# Patient Record
Sex: Female | Born: 1990 | Race: White | Hispanic: No | Marital: Single | Smoking: Former smoker
Health system: Southern US, Community
[De-identification: ages and names within clinical notes are randomized; demographics above are authoritative.]

## PROBLEM LIST (undated history)

## (undated) ENCOUNTER — Inpatient Hospital Stay: Payer: Self-pay

## (undated) DIAGNOSIS — L304 Erythema intertrigo: Secondary | ICD-10-CM

## (undated) DIAGNOSIS — F429 Obsessive-compulsive disorder, unspecified: Secondary | ICD-10-CM

## (undated) DIAGNOSIS — F410 Panic disorder [episodic paroxysmal anxiety] without agoraphobia: Secondary | ICD-10-CM

## (undated) DIAGNOSIS — K219 Gastro-esophageal reflux disease without esophagitis: Secondary | ICD-10-CM

## (undated) DIAGNOSIS — E669 Obesity, unspecified: Secondary | ICD-10-CM

## (undated) DIAGNOSIS — L309 Dermatitis, unspecified: Secondary | ICD-10-CM

## (undated) DIAGNOSIS — F32A Depression, unspecified: Secondary | ICD-10-CM

## (undated) DIAGNOSIS — F329 Major depressive disorder, single episode, unspecified: Secondary | ICD-10-CM

## (undated) DIAGNOSIS — T7840XA Allergy, unspecified, initial encounter: Secondary | ICD-10-CM

## (undated) HISTORY — DX: Erythema intertrigo: L30.4

## (undated) HISTORY — DX: Panic disorder (episodic paroxysmal anxiety): F41.0

## (undated) HISTORY — DX: Gastro-esophageal reflux disease without esophagitis: K21.9

## (undated) HISTORY — DX: Obsessive-compulsive disorder, unspecified: F42.9

## (undated) HISTORY — DX: Obesity, unspecified: E66.9

## (undated) HISTORY — DX: Dermatitis, unspecified: L30.9

## (undated) HISTORY — DX: Allergy, unspecified, initial encounter: T78.40XA

## (undated) HISTORY — DX: Depression, unspecified: F32.A

## (undated) HISTORY — DX: Major depressive disorder, single episode, unspecified: F32.9

---
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
## 2010-02-13 ENCOUNTER — Observation Stay: Payer: Self-pay

## 2010-02-16 ENCOUNTER — Observation Stay: Payer: Self-pay

## 2010-02-18 ENCOUNTER — Inpatient Hospital Stay: Payer: Self-pay | Admitting: Obstetrics and Gynecology

## 2010-02-20 DIAGNOSIS — O4100X0 Oligohydramnios, unspecified trimester, not applicable or unspecified: Secondary | ICD-10-CM

## 2010-05-02 ENCOUNTER — Ambulatory Visit: Payer: Self-pay | Admitting: Family Medicine

## 2010-05-02 DIAGNOSIS — J309 Allergic rhinitis, unspecified: Secondary | ICD-10-CM | POA: Insufficient documentation

## 2010-05-06 ENCOUNTER — Emergency Department: Payer: Self-pay | Admitting: Unknown Physician Specialty

## 2010-12-27 ENCOUNTER — Ambulatory Visit: Payer: Self-pay | Admitting: Family Medicine

## 2011-01-09 HISTORY — PX: CHOLECYSTECTOMY: SHX55

## 2011-01-15 ENCOUNTER — Ambulatory Visit: Payer: Self-pay | Admitting: Anesthesiology

## 2011-01-20 ENCOUNTER — Ambulatory Visit: Payer: Self-pay | Admitting: Surgery

## 2011-01-21 LAB — PATHOLOGY REPORT

## 2011-06-16 ENCOUNTER — Ambulatory Visit: Payer: Self-pay | Admitting: Orthopedic Surgery

## 2012-11-25 ENCOUNTER — Emergency Department: Payer: Self-pay | Admitting: Internal Medicine

## 2013-08-21 ENCOUNTER — Emergency Department: Payer: Self-pay | Admitting: Emergency Medicine

## 2013-10-18 LAB — HM PAP SMEAR: HM Pap smear: NORMAL

## 2014-03-06 ENCOUNTER — Emergency Department: Payer: Self-pay | Admitting: Emergency Medicine

## 2014-03-06 LAB — URINALYSIS, COMPLETE
BACTERIA: NONE SEEN
Bilirubin,UR: NEGATIVE
Blood: NEGATIVE
GLUCOSE, UR: NEGATIVE mg/dL (ref 0–75)
Ketone: NEGATIVE
LEUKOCYTE ESTERASE: NEGATIVE
Nitrite: NEGATIVE
PH: 7 (ref 4.5–8.0)
Protein: NEGATIVE
RBC,UR: 1 /HPF (ref 0–5)
Specific Gravity: 1.021 (ref 1.003–1.030)
Squamous Epithelial: 2
WBC UR: 2 /HPF (ref 0–5)

## 2014-03-06 LAB — COMPREHENSIVE METABOLIC PANEL
ALK PHOS: 102 U/L
ALT: 30 U/L (ref 12–78)
ANION GAP: 7 (ref 7–16)
Albumin: 3.7 g/dL (ref 3.4–5.0)
BILIRUBIN TOTAL: 0.5 mg/dL (ref 0.2–1.0)
BUN: 8 mg/dL (ref 7–18)
CALCIUM: 8.9 mg/dL (ref 8.5–10.1)
Chloride: 102 mmol/L (ref 98–107)
Co2: 25 mmol/L (ref 21–32)
Creatinine: 0.73 mg/dL (ref 0.60–1.30)
EGFR (African American): >60
EGFR (Non-African Amer.): >60
Glucose: 91 mg/dL (ref 65–99)
Osmolality: 266 (ref 275–301)
Potassium: 3.5 mmol/L (ref 3.5–5.1)
SGOT(AST): 23 U/L (ref 15–37)
SODIUM: 134 mmol/L — AB (ref 136–145)
TOTAL PROTEIN: 8.2 g/dL (ref 6.4–8.2)

## 2014-03-06 LAB — CBC WITH DIFFERENTIAL/PLATELET
Basophil #: 0.1 10*3/uL (ref 0.0–0.1)
Basophil %: 0.5 %
Eosinophil #: 0.1 10*3/uL (ref 0.0–0.7)
Eosinophil %: 1.1 %
HCT: 38 % (ref 35.0–47.0)
HGB: 12.6 g/dL (ref 12.0–16.0)
Lymphocyte #: 2.4 10*3/uL (ref 1.0–3.6)
Lymphocyte %: 19.6 %
MCH: 26.4 pg (ref 26.0–34.0)
MCHC: 33.2 g/dL (ref 32.0–36.0)
MCV: 80 fL (ref 80–100)
MONO ABS: 0.7 x10 3/mm (ref 0.2–0.9)
Monocyte %: 5.8 %
Neutrophil #: 9.1 10*3/uL — ABNORMAL HIGH (ref 1.4–6.5)
Neutrophil %: 73 %
PLATELETS: 201 10*3/uL (ref 150–440)
RBC: 4.78 10*6/uL (ref 3.80–5.20)
RDW: 15.9 % — ABNORMAL HIGH (ref 11.5–14.5)
WBC: 12.5 10*3/uL — ABNORMAL HIGH (ref 3.6–11.0)

## 2014-03-06 LAB — GC/CHLAMYDIA PROBE AMP

## 2014-03-06 LAB — HCG, QUANTITATIVE, PREGNANCY: Beta Hcg, Quant.: 163 m[IU]/mL — ABNORMAL HIGH

## 2014-03-06 LAB — WET PREP, GENITAL

## 2014-03-14 ENCOUNTER — Emergency Department: Payer: Self-pay | Admitting: Emergency Medicine

## 2014-03-14 LAB — CBC
HCT: 38.3 % (ref 35.0–47.0)
HGB: 12.9 g/dL (ref 12.0–16.0)
MCH: 26.4 pg (ref 26.0–34.0)
MCHC: 33.7 g/dL (ref 32.0–36.0)
MCV: 78 fL — ABNORMAL LOW (ref 80–100)
Platelet: 189 10*3/uL (ref 150–440)
RBC: 4.89 10*6/uL (ref 3.80–5.20)
RDW: 15.5 % — ABNORMAL HIGH (ref 11.5–14.5)
WBC: 5.7 10*3/uL (ref 3.6–11.0)

## 2014-03-14 LAB — HCG, QUANTITATIVE, PREGNANCY: BETA HCG, QUANT.: 2029 m[IU]/mL — AB

## 2014-03-17 ENCOUNTER — Emergency Department: Payer: Self-pay | Admitting: Emergency Medicine

## 2014-03-17 LAB — HCG, QUANTITATIVE, PREGNANCY: Beta Hcg, Quant.: 4324 m[IU]/mL — ABNORMAL HIGH

## 2014-03-17 LAB — CBC
HCT: 39.3 % (ref 35.0–47.0)
HGB: 12.7 g/dL (ref 12.0–16.0)
MCH: 25.5 pg — ABNORMAL LOW (ref 26.0–34.0)
MCHC: 32.3 g/dL (ref 32.0–36.0)
MCV: 79 fL — ABNORMAL LOW (ref 80–100)
PLATELETS: 215 10*3/uL (ref 150–440)
RBC: 4.97 10*6/uL (ref 3.80–5.20)
RDW: 15.4 % — AB (ref 11.5–14.5)
WBC: 11.5 10*3/uL — AB (ref 3.6–11.0)

## 2014-03-18 LAB — URINALYSIS, COMPLETE
Bilirubin,UR: NEGATIVE
Glucose,UR: NEGATIVE mg/dL (ref 0–75)
Nitrite: NEGATIVE
Ph: 5 (ref 4.5–8.0)
Protein: 30
RBC,UR: 21 /HPF (ref 0–5)
Specific Gravity: 1.03 (ref 1.003–1.030)
Squamous Epithelial: 17
WBC UR: 24 /HPF (ref 0–5)

## 2014-03-23 ENCOUNTER — Encounter: Payer: Self-pay | Admitting: Obstetrics and Gynecology

## 2014-03-25 ENCOUNTER — Emergency Department: Payer: Self-pay | Admitting: Emergency Medicine

## 2014-03-25 LAB — HCG, QUANTITATIVE, PREGNANCY: Beta Hcg, Quant.: 15371 m[IU]/mL — ABNORMAL HIGH

## 2014-05-04 ENCOUNTER — Encounter: Payer: Self-pay | Admitting: Maternal & Fetal Medicine

## 2014-05-17 ENCOUNTER — Emergency Department: Payer: Self-pay | Admitting: Emergency Medicine

## 2014-05-17 LAB — HCG, QUANTITATIVE, PREGNANCY: Beta Hcg, Quant.: 30350 m[IU]/mL — ABNORMAL HIGH

## 2014-06-15 ENCOUNTER — Ambulatory Visit: Payer: Self-pay | Admitting: Physician Assistant

## 2014-08-04 ENCOUNTER — Observation Stay: Payer: Self-pay | Admitting: Obstetrics and Gynecology

## 2014-08-08 ENCOUNTER — Encounter: Payer: Self-pay | Admitting: Pediatric Cardiology

## 2014-08-08 DIAGNOSIS — Z8279 Family history of other congenital malformations, deformations and chromosomal abnormalities: Secondary | ICD-10-CM | POA: Insufficient documentation

## 2014-10-30 ENCOUNTER — Observation Stay: Payer: Self-pay | Admitting: Obstetrics and Gynecology

## 2014-11-03 ENCOUNTER — Observation Stay: Payer: Self-pay | Admitting: Obstetrics and Gynecology

## 2014-11-08 ENCOUNTER — Observation Stay: Payer: Self-pay | Admitting: Obstetrics and Gynecology

## 2014-11-14 ENCOUNTER — Observation Stay: Payer: Self-pay | Admitting: Obstetrics and Gynecology

## 2014-11-18 LAB — CBC WITH DIFFERENTIAL/PLATELET
Basophil #: 0 10*3/uL (ref 0.0–0.1)
Basophil %: 0.3 %
Eosinophil #: 0.1 10*3/uL (ref 0.0–0.7)
Eosinophil %: 0.6 %
HCT: 34.1 % — ABNORMAL LOW (ref 35.0–47.0)
HGB: 11.2 g/dL — ABNORMAL LOW (ref 12.0–16.0)
Lymphocyte #: 3 10*3/uL (ref 1.0–3.6)
Lymphocyte %: 24.4 %
MCH: 25.1 pg — ABNORMAL LOW (ref 26.0–34.0)
MCHC: 32.8 g/dL (ref 32.0–36.0)
MCV: 77 fL — AB (ref 80–100)
MONO ABS: 0.6 x10 3/mm (ref 0.2–0.9)
Monocyte %: 5 %
NEUTROS PCT: 69.7 %
Neutrophil #: 8.5 10*3/uL — ABNORMAL HIGH (ref 1.4–6.5)
PLATELETS: 191 10*3/uL (ref 150–440)
RBC: 4.45 10*6/uL (ref 3.80–5.20)
RDW: 15 % — AB (ref 11.5–14.5)
WBC: 12.2 10*3/uL — AB (ref 3.6–11.0)

## 2014-11-18 LAB — GC/CHLAMYDIA PROBE AMP

## 2014-11-19 ENCOUNTER — Inpatient Hospital Stay: Payer: Self-pay

## 2014-11-20 LAB — HEMATOCRIT: HCT: 27.1 % — ABNORMAL LOW (ref 35.0–47.0)

## 2015-03-20 NOTE — H&P (Signed)
L&D Evaluation:  History:  HPI 24 yo G2P1001 at 5619w4d [facility] presenting for evaluation of vaginal bleeding.  Bleeding is described as vaginal spotting noted when whipping.  She has had two episodes.  Has not had to wear a pad.  No abdominal pain.  No concern for low lying placenta or previa on 20 week scan.  Patient denies recent cervical checks or intercourse.  Called clinic but states not work in appointment today so presented to L&D for evaluation.   +FM, no LOF, no VB, no ctx  [facility] with normal 1-hr OGTT.  1st pregnancy complicated by son being born with aortic stenosis not diagnosed prenatally   Presents with vaginal bleeding   Patient's Medical History PCOS, HSV   Patient's Surgical History Colonoscopy   Medications Pre Natal Vitamins   Allergies NKDA   Social History none   Family History Non-Contributory   ROS:  ROS All systems were reviewed.  HEENT, CNS, GI, GU, Respiratory, CV, Renal and Musculoskeletal systems were found to be normal.   Exam:  Vital Signs stable   Urine Protein not completed   General no apparent distress   Mental Status clear   Abdomen gravid, non-tender   Estimated Fetal Weight Average for gestational age   Edema no edema   Pelvic no external lesions, cervix closed and thick, no bleeding noted from cervix, no blood in vaginal vault or on examining glove following bimanual exam   Mebranes Intact   FHT 140, moderate, no accels, no decels approrpiate for gestational age   Ucx absent   Impression:  Impression Late second trimester spotting   Plan:  Plan EFM/NST   Comments 1) Discussed implications for bleeding in second third trimester, most light bleeding likley due to cervicitis or ectroprion.  No concern for abruption or previa.   PTL may present with vaginal bleeding as well but we saw no evidence of conractions and closed cervix.  I did stress to the patient that should she  have further or heavier bleeding, abdominal pain, decreased fetal movment to represent for re-evaluation. Patient is Rh positive rhogam not indicated  2) Fetus - RFS for gestational age  573) Disposition - discharged home   Electronic Signatures: Lorrene ReidStaebler, Daisja Kessinger M (MD)  (Signed 26-Sep-15 14:32)  Authored: L&D Evaluation   Last Updated: 26-Sep-15 14:32 by Lorrene ReidStaebler, Elisea Khader M (MD)

## 2015-03-20 NOTE — H&P (Signed)
L&D Evaluation:  History:  HPI 24 yo G2P1001 at 3938w0d by EDC of 11/14/2014 by first trimester ultraosund presented with decreased FM. Denies VB, LOF, irregular ctxs.   [facility] with normal 1-hr OGTT., first and second trimester bleeding, scabies, and genital herpes. 1st pregnancy complicated by son being born with aortic stenosis not diagnosed prenatally   Presents with decreased fetal movement   Patient's Medical History PCOS, HSV   Patient's Surgical History Colonoscopy   Medications Pre Natal Vitamins  Valtrex   Allergies NKDA   Social History none   Family History Non-Contributory   ROS:  ROS see HPI   Exam:  Vital Signs 128/74   Urine Protein not completed   General no apparent distress   Mental Status clear   Abdomen gravid, non-tender   Fetal Position cephalic   FHT normal rate with no decels, 130, moderate, positive accels, no decels   FHT Description mod variability   Ucx irregular, q6-308min   Impression:  Impression IUP at 4238w0d with decreased fetal movement   Plan:  Comments - reactive NST - discharge home with [facility]   Electronic Signatures: Lorrene ReidStaebler, Jomes Giraldo M (MD)  (Signed 05-Jan-16 22:21)  Authored: L&D Evaluation   Last Updated: 05-Jan-16 22:21 by Lorrene ReidStaebler, Burgess Sheriff M (MD)

## 2015-03-20 NOTE — H&P (Signed)
L&D Evaluation:  History:  HPI 24 yo G2P1001 with EDC of 11/14/2014 by first trimester ultraosund presented at 38 weeks with decreased FM x 2 hours after falling 8-9 hours ago. She reports tripping and falling over a stump in her yard, falling on her right side. around 7AM. Denies VB, LOF, or abdominal pain. No regular ctxs. last felt baby move two hours PTA.   [facility] with normal 1-hr OGTT., first and second trimester bleeding, scabies, and genital herpes. 1st pregnancy complicated by son being born with aortic stenosis not diagnosed prenatally   Presents with vaginal bleeding   Patient's Medical History PCOS, HSV   Patient's Surgical History Colonoscopy   Medications Pre Natal Vitamins  Valtrex   Allergies NKDA   Social History none   Family History Non-Contributory   ROS:  ROS see HPI   Exam:  Vital Signs 128/74   Urine Protein not completed   General no apparent distress   Mental Status clear   Abdomen gravid, non-tender   Fetal Position cephalic   Mebranes Intact, AFI=1.88+ 2.74+1.45+3.48 cm=10.55cm   FHT normal rate with no decels, 115-120 with accels to 140s to 150   FHT Description mod variability   Ucx uterine irritability, occ ctx   Impression:  Impression IUP at 38 weeks with reactive NST. No evidence of abruption s/p fall   Plan:  Plan DC home with labor precautions. Discussed [facility] instructions.. FU in office as scheduled.   Electronic Signatures: [name], Darrius Montano L ([name])  (Signed 22-Dec-15 06:00)  Authored: L&D Evaluation   Last Updated: 22-Dec-15 06:00 by Trinna BalloonGutierrez, Jakaila Norment L ([name])

## 2015-05-09 ENCOUNTER — Encounter: Payer: Self-pay | Admitting: Family Medicine

## 2015-05-09 ENCOUNTER — Ambulatory Visit (INDEPENDENT_AMBULATORY_CARE_PROVIDER_SITE_OTHER): Payer: 59 | Admitting: Family Medicine

## 2015-05-09 ENCOUNTER — Encounter (INDEPENDENT_AMBULATORY_CARE_PROVIDER_SITE_OTHER): Payer: Self-pay

## 2015-05-09 VITALS — BP 112/66 | HR 83 | Temp 97.8°F | Resp 16 | Ht 66.0 in | Wt 244.8 lb

## 2015-05-09 DIAGNOSIS — N921 Excessive and frequent menstruation with irregular cycle: Secondary | ICD-10-CM | POA: Diagnosis not present

## 2015-05-09 DIAGNOSIS — L409 Psoriasis, unspecified: Secondary | ICD-10-CM | POA: Diagnosis not present

## 2015-05-09 DIAGNOSIS — E669 Obesity, unspecified: Secondary | ICD-10-CM

## 2015-05-09 DIAGNOSIS — K589 Irritable bowel syndrome without diarrhea: Secondary | ICD-10-CM | POA: Diagnosis not present

## 2015-05-09 DIAGNOSIS — E282 Polycystic ovarian syndrome: Secondary | ICD-10-CM

## 2015-05-09 DIAGNOSIS — F429 Obsessive-compulsive disorder, unspecified: Secondary | ICD-10-CM | POA: Insufficient documentation

## 2015-05-09 DIAGNOSIS — E668 Other obesity: Secondary | ICD-10-CM

## 2015-05-09 DIAGNOSIS — Z8659 Personal history of other mental and behavioral disorders: Secondary | ICD-10-CM | POA: Insufficient documentation

## 2015-05-09 MED ORDER — DESONIDE 0.05 % EX CREA: TOPICAL_CREAM | Freq: Two times a day (BID) | CUTANEOUS | Status: DC

## 2015-05-09 NOTE — Progress Notes (Signed)
Name: Lisa House   MRN: [id number]    DOB: May 05, 1991   Date:05/09/2015       Progress Note  Subjective  Chief Complaint  Chief Complaint  Patient presents with  . Menstrual Problem    Patient states that she has been bleeding for 2 weeks. She states that her normal cycle last for 4-5 days. She states that this cycle started brown blood and now is bright red blood. She is currently on birth control and this cycle happened in the middle of the pack. Patient reports that she has not missed any doses.     HPI  Breakthrough bleeding: she had her second sone on 11/19/2014, started on Junel ( 3 months continuous ocp) given by GYN on May 25th, 2016, she started to spot when she started the second package, bleeding is decreasing now. She also has some cramping. No breast tenderness, but she has been feeling nauseated. Pregnancy test at home was negative.  Psoriasis: doing well on medication, needs refills, she states symptoms are better during the Summer  PCOS: taking metformin, weight has been stable, taking ocp also.   Obesity: she is doing well on Metformin, but needs to increase physical activity.  IBS: still has diarrhea, and some abdominal cramping, but doing well at this time.   Patient Active Problem List   Diagnosis Date Noted  . Chronic eczema 05/09/2015  . IBS (irritable bowel syndrome) 05/09/2015  . Extreme obesity 05/09/2015  . OCD (obsessive compulsive disorder) 05/09/2015  . Bilateral polycystic ovarian syndrome 05/09/2015  . Psoriasis 05/09/2015  . History of depression 05/09/2015  . Allergic rhinitis 05/02/2010    Past Surgical History  Procedure Laterality Date  . Cholecystectomy  01/09/2011    Dr. Excell Seltzerooper    Family History  Problem Relation Age of Onset  . Hypertension Mother   . Diabetes Mother   . Headache Mother   . Hypertension Father   . Heart disease Son     Aortic Valve Disorder Stenosis-repaired  . Lung cancer Father     History   Social  History  . Marital Status: Single    Spouse Name: N/A  . Number of Children: 2  . Years of Education: highschool   Occupational History  .  Unemployed   Social History Main Topics  . Smoking status: Never Smoker   . Smokeless tobacco: Not on file  . Alcohol Use: 0.0 oz/week    0 Standard drinks or equivalent per week     Comment: occasional  . Drug Use: No  . Sexual Activity: Yes    Birth Control/ Protection: Pill   Other Topics Concern  . Not on file   Social History Narrative     Current outpatient prescriptions:  .  metFORMIN (GLUCOPHAGE) 1000 MG tablet, Take by mouth., Disp: , Rfl:  .  norethindrone-ethinyl estradiol (MICROGESTIN,JUNEL,LOESTRIN) 1-20 MG-MCG tablet, Take 1 tablet by mouth daily., Disp: , Rfl:  .  desonide (DESOWEN) 0.05 % cream, Apply topically 2 (two) times daily., Disp: 60 g, Rfl: 2  No Known Allergies   ROS  Constitutional: Negative for fever or weight change.  Respiratory: Negative for cough and shortness of breath.   Cardiovascular: Negative for chest pain or palpitations.  Gastrointestinal: Negative for abdominal pain, no bowel changes.  Musculoskeletal: Negative for gait problem or joint swelling.  Skin: Negative for rash.  Neurological: Negative for dizziness or headache.  No other specific complaints in a complete review of systems (except as listed in  HPI above).   Objective  Filed Vitals:   05/09/15 0914  BP: 112/66  Pulse: 83  Temp: 97.8 F (36.6 C)  TempSrc: Oral  Resp: 16  Height:  (1.676 m)  Weight: 244 lb 12.8 oz (111.041 kg)  SpO2: 97%    Body mass index is 39.53 kg/(m^2).  Physical Exam  Constitutional: Patient appears well-developed and well-nourished. No distress. Obesity Eyes:  No scleral icterus. PERL Neck: Normal range of motion. Neck supple. Cardiovascular: Normal rate, regular rhythm and normal heart sounds.  No murmur heard. No BLE edema. Pulmonary/Chest: Effort normal and breath sounds normal. No  respiratory distress. Abdominal: Soft.  There is no tenderness. Psychiatric: Patient has a normal mood and affect. behavior is normal. Judgment and thought content normal. Skin: psoriatic plaques on right elbow and left wrist   PHQ2/9: Depression screen PHQ 2/9 05/09/2015  Decreased Interest 0  Down, Depressed, Hopeless 0  PHQ - 2 Score 0     Fall Risk: Fall Risk  05/09/2015  Falls in the past year? Yes  Injury with Fall? No    Assessment & Plan  1. Breakthrough bleeding Reassurance, explained it may happen with continuous ocp therapy. Advised to stop pills for 5 days if bleeding happens again and resume medication  2. Extreme obesity Discussed diet and exercise  3. IBS (irritable bowel syndrome  Doing well at this time. 4. Psoriasis  - desonide (DESOWEN) 0.05 % cream; Apply topically 2 (two) times daily.  Dispense: 60 g; Refill: 2

## 2015-05-09 NOTE — Patient Instructions (Signed)
If symptoms of breakthrough bleeding continues, stop ocp for 5 days and go right back on it.

## 2015-08-09 ENCOUNTER — Ambulatory Visit: Payer: 59 | Admitting: Family Medicine

## 2015-12-19 ENCOUNTER — Ambulatory Visit: Payer: 59 | Admitting: Family Medicine

## 2015-12-27 ENCOUNTER — Other Ambulatory Visit: Payer: Self-pay | Admitting: Family Medicine

## 2015-12-27 NOTE — Telephone Encounter (Signed)
Patient requesting refill. 

## 2015-12-27 NOTE — Telephone Encounter (Signed)
Appointment made on 01-21-16

## 2015-12-31 ENCOUNTER — Other Ambulatory Visit: Payer: Self-pay | Admitting: Family Medicine

## 2015-12-31 NOTE — Telephone Encounter (Signed)
Patient requesting refill. 

## 2016-01-21 ENCOUNTER — Ambulatory Visit (INDEPENDENT_AMBULATORY_CARE_PROVIDER_SITE_OTHER): Payer: 59 | Admitting: Family Medicine

## 2016-01-21 ENCOUNTER — Encounter: Payer: Self-pay | Admitting: Family Medicine

## 2016-01-21 VITALS — BP 122/78 | HR 78 | Temp 97.8°F | Resp 20 | Ht 66.0 in | Wt 257.4 lb

## 2016-01-21 DIAGNOSIS — E282 Polycystic ovarian syndrome: Secondary | ICD-10-CM

## 2016-01-21 DIAGNOSIS — L409 Psoriasis, unspecified: Secondary | ICD-10-CM | POA: Diagnosis not present

## 2016-01-21 DIAGNOSIS — J029 Acute pharyngitis, unspecified: Secondary | ICD-10-CM | POA: Diagnosis not present

## 2016-01-21 DIAGNOSIS — K589 Irritable bowel syndrome without diarrhea: Secondary | ICD-10-CM | POA: Diagnosis not present

## 2016-01-21 LAB — POCT RAPID STREP A (OFFICE): Rapid Strep A Screen: NEGATIVE

## 2016-01-21 MED ORDER — DESONIDE 0.05 % EX CREA: TOPICAL_CREAM | Freq: Two times a day (BID) | CUTANEOUS | Status: DC

## 2016-01-21 MED ORDER — FLUTICASONE PROPIONATE 50 MCG/ACT NA SUSP: 2.0000 | Freq: Every day | NASAL | Status: DC

## 2016-01-21 MED ORDER — METFORMIN HCL 1000 MG PO TABS: 1000.0000 mg | ORAL_TABLET | Freq: Two times a day (BID) | ORAL | Status: DC

## 2016-01-21 MED ORDER — MAGIC MOUTHWASH W/LIDOCAINE: 15.0000 mL | Freq: Three times a day (TID) | ORAL | Status: DC | PRN

## 2016-01-21 NOTE — Progress Notes (Signed)
Name: Lisa House   MRN: [id number]    DOB: Apr 26, 1991   Date:01/21/2016       Progress Note  Subjective  Chief Complaint  Chief Complaint  Patient presents with  . medication refill  . Sore Throat    left side only and ear pain also on left side    HPI  Breakthrough bleeding: she had her second son on 11/19/2014, started on Junel ( 3 months continuous ocp) given by GYN on May 25th, 2016, she started to spot when she started the second package, bleeding is decreasing now.She forgets to take pills and has spotting, thinking about having IUD, but wants to one more baby first.   Psoriasis: she has rash on upper extremities, she states if she uses the Desonide and Eucerin symptoms gets under control, however she has not been using it lately.  PCOS: taking metformin, weight has gone up ,   Obesity: she was doing well on Metformin, she stopped taking it for about one month and gained some weight, she is back on it, but has not lost weight as fast.  She states she has been avoiding sweet beverages, mostly drinking water, walking four times week for about 30 minutes.   IBS: she has constipation and diarrhea. Episodes of diarrhea now about two times weekly. She takes Prune juice prn for constipation, does not want medications at this time.   Sore throat: she states she has noticed sore throat on the left side, hurts when she swallows and radiates to left ear, no fever, no chills.   Patient Active Problem List   Diagnosis Date Noted  . IBS (irritable bowel syndrome) 05/09/2015  . Extreme obesity (HCC) 05/09/2015  . OCD (obsessive compulsive disorder) 05/09/2015  . Bilateral polycystic ovarian syndrome 05/09/2015  . Psoriasis 05/09/2015  . History of depression 05/09/2015  . Allergic rhinitis 05/02/2010    Past Surgical History  Procedure Laterality Date  . Cholecystectomy  01/09/2011    Dr. Excell Seltzerooper    Family History  Problem Relation Age of Onset  . Hypertension Mother   .  Diabetes Mother   . Headache Mother   . Hypertension Father   . Heart disease Son     Aortic Valve Disorder Stenosis-repaired  . Lung cancer Father     Social History   Social History  . Marital Status: Single    Spouse Name: N/A  . Number of Children: 2  . Years of Education: highschool   Occupational History  .  Unemployed   Social History Main Topics  . Smoking status: Never Smoker   . Smokeless tobacco: Not on file  . Alcohol Use: 0.0 oz/week    0 Standard drinks or equivalent per week     Comment: occasional  . Drug Use: No  . Sexual Activity: Yes    Birth Control/ Protection: Pill   Other Topics Concern  . Not on file   Social History Narrative     Current outpatient prescriptions:  .  desonide (DESOWEN) 0.05 % cream, Apply topically 2 (two) times daily., Disp: 60 g, Rfl: 2 .  metFORMIN (GLUCOPHAGE) 1000 MG tablet, Take 1 tablet (1,000 mg total) by mouth 2 (two) times daily with a meal., Disp: 60 tablet, Rfl: 5 .  norethindrone-ethinyl estradiol (MICROGESTIN,JUNEL,LOESTRIN) 1-20 MG-MCG tablet, Take 1 tablet by mouth daily., Disp: , Rfl:   No Known Allergies   ROS  Constitutional: Negative for fever, positive  weight change.  Respiratory: Negative for cough and shortness  of breath.   Cardiovascular: Negative for chest pain or palpitations.  Gastrointestinal: Negative for abdominal pain, no bowel changes.  Musculoskeletal: Negative for gait problem or joint swelling.  Skin: Negative for rash.  Neurological: Negative for dizziness or headache.  No other specific complaints in a complete review of systems (except as listed in HPI above).  Objective  Filed Vitals:   01/21/16 0850  BP: 122/78  Pulse: 78  Temp: 97.8 F (36.6 C)  Resp: 20  Height:  (1.676 m)  Weight: 257 lb 6 oz (116.745 kg)  SpO2: 98%    Body mass index is 41.56 kg/(m^2).  Physical Exam  Constitutional: Patient appears well-developed and well-nourished. Obese  No distress.   HEENT: head atraumatic, normocephalic, pupils equal and reactive to light, ears normal TM, no pain during percussion of sinus, neck supple, throat within normal limits Cardiovascular: Normal rate, regular rhythm and normal heart sounds.  No murmur heard. No BLE edema. Pulmonary/Chest: Effort normal and breath sounds normal. No respiratory distress. Abdominal: Soft.  There is no tenderness. Psychiatric: Patient has a normal mood and affect. behavior is normal. Judgment and thought content normal.  Recent Results (from the past 2160 hour(s))  POCT rapid strep A     Status: None   Collection Time: 01/21/16  9:00 AM  Result Value Ref Range   Rapid Strep A Screen Negative Negative    PHQ2/9: Depression screen PHQ 2/9 05/09/2015  Decreased Interest 0  Down, Depressed, Hopeless 0  PHQ - 2 Score 0     Fall Risk: Fall Risk  05/09/2015  Falls in the past year? Yes  Injury with Fall? No    Assessment & Plan  1. Sore throat  Call back if no resolution of symptoms in one week and I will make referral to ENT - POCT rapid strep A - magic mouthwash w/lidocaine SOLN; Take 15 mLs by mouth 3 (three) times daily as needed for mouth pain.  Dispense: 180 mL; Refill: 0 - fluticasone (FLONASE) 50 MCG/ACT nasal spray; Place 2 sprays into both nostrils daily.  Dispense: 16 g; Refill: 0   2. Morbid obesity, unspecified obesity type (HCC)  - metFORMIN (GLUCOPHAGE) 1000 MG tablet; Take 1 tablet (1,000 mg total) by mouth 2 (two) times daily with a meal.  Dispense: 60 tablet; Refill: 5  3. Bilateral polycystic ovarian syndrome  - metFORMIN (GLUCOPHAGE) 1000 MG tablet; Take 1 tablet (1,000 mg total) by mouth 2 (two) times daily with a meal.  Dispense: 60 tablet; Refill: 5  4. IBS (irritable bowel syndrome)  Discussed diet  5. Psoriasis  - desonide (DESOWEN) 0.05 % cream; Apply topically 2 (two) times daily.  Dispense: 60 g; Refill: 2

## 2016-02-29 ENCOUNTER — Telehealth: Payer: Self-pay

## 2016-02-29 NOTE — Telephone Encounter (Signed)
Patient states on her last visit Dr. Carlynn PurlSowles and the patient discussed starting phentamine, but patient was unsure and was told to call back if she decided she does want to start it. Patient called and ask that you please send it into her pharmacy. Thanks

## 2016-03-02 NOTE — Telephone Encounter (Signed)
She needs to come in to get prescription ( other than phentermine ) for weight loss. They are usually not covered by insurance and will likely cost over 100 dollars

## 2016-03-02 NOTE — Telephone Encounter (Signed)
I don't prescribe phentermine, we discussed options for weight loss

## 2016-03-03 ENCOUNTER — Telehealth: Payer: Self-pay | Admitting: Family Medicine

## 2016-03-03 NOTE — Telephone Encounter (Signed)
PT SAID THAT YOU TOLD HER TO LET YOU KNOW IF SHE WANTED TO START THE WEIGHT MEDICATION ( PHENTERMINE) SHE WANTS YOU TO CALL IT INTO PHARM IS [facility].

## 2016-03-03 NOTE — Telephone Encounter (Signed)
Patient notified and will call her insurance to see what they cover than call us back to make a appointment.

## 2016-04-15 DIAGNOSIS — H6502 Acute serous otitis media, left ear: Secondary | ICD-10-CM | POA: Diagnosis not present

## 2016-04-25 DIAGNOSIS — K6289 Other specified diseases of anus and rectum: Secondary | ICD-10-CM | POA: Diagnosis not present

## 2016-04-25 DIAGNOSIS — B009 Herpesviral infection, unspecified: Secondary | ICD-10-CM | POA: Diagnosis not present

## 2016-04-28 DIAGNOSIS — L0291 Cutaneous abscess, unspecified: Secondary | ICD-10-CM | POA: Diagnosis not present

## 2016-04-28 DIAGNOSIS — N898 Other specified noninflammatory disorders of vagina: Secondary | ICD-10-CM | POA: Diagnosis not present

## 2016-04-28 DIAGNOSIS — A601 Herpesviral infection of perianal skin and rectum: Secondary | ICD-10-CM | POA: Diagnosis not present

## 2016-05-09 DIAGNOSIS — Z01419 Encounter for gynecological examination (general) (routine) without abnormal findings: Secondary | ICD-10-CM | POA: Diagnosis not present

## 2016-05-09 DIAGNOSIS — Z3040 Encounter for surveillance of contraceptives, unspecified: Secondary | ICD-10-CM | POA: Diagnosis not present

## 2016-06-20 DIAGNOSIS — L989 Disorder of the skin and subcutaneous tissue, unspecified: Secondary | ICD-10-CM | POA: Diagnosis not present

## 2016-07-23 ENCOUNTER — Ambulatory Visit: Payer: 59 | Admitting: Family Medicine

## 2016-08-02 ENCOUNTER — Encounter: Payer: Self-pay | Admitting: Emergency Medicine

## 2016-08-02 ENCOUNTER — Emergency Department: Payer: 59

## 2016-08-02 ENCOUNTER — Emergency Department
Admission: EM | Admit: 2016-08-02 | Discharge: 2016-08-02 | Disposition: A | Discharge status: Home / Self Care | Payer: 59 | Attending: Emergency Medicine | Admitting: Emergency Medicine

## 2016-08-02 DIAGNOSIS — R079 Chest pain, unspecified: Secondary | ICD-10-CM | POA: Diagnosis not present

## 2016-08-02 DIAGNOSIS — R1010 Upper abdominal pain, unspecified: Secondary | ICD-10-CM | POA: Diagnosis not present

## 2016-08-02 DIAGNOSIS — Z79899 Other long term (current) drug therapy: Secondary | ICD-10-CM | POA: Diagnosis not present

## 2016-08-02 DIAGNOSIS — Z7984 Long term (current) use of oral hypoglycemic drugs: Secondary | ICD-10-CM | POA: Insufficient documentation

## 2016-08-02 DIAGNOSIS — R0602 Shortness of breath: Secondary | ICD-10-CM | POA: Diagnosis not present

## 2016-08-02 DIAGNOSIS — R109 Unspecified abdominal pain: Secondary | ICD-10-CM | POA: Insufficient documentation

## 2016-08-02 DIAGNOSIS — R0789 Other chest pain: Secondary | ICD-10-CM | POA: Diagnosis not present

## 2016-08-02 LAB — CBC WITH DIFFERENTIAL/PLATELET
Basophils Absolute: 0 10*3/uL (ref 0–0.1)
Basophils Relative: 0 %
Eosinophils Absolute: 0.1 10*3/uL (ref 0–0.7)
Eosinophils Relative: 2 %
HEMATOCRIT: 36.6 % (ref 35.0–47.0)
HEMOGLOBIN: 12.6 g/dL (ref 12.0–16.0)
LYMPHS ABS: 2.4 10*3/uL (ref 1.0–3.6)
LYMPHS PCT: 30 %
MCH: 26.4 pg (ref 26.0–34.0)
MCHC: 34.3 g/dL (ref 32.0–36.0)
MCV: 77.1 fL — AB (ref 80.0–100.0)
MONOS PCT: 7 %
Monocytes Absolute: 0.5 10*3/uL (ref 0.2–0.9)
NEUTROS ABS: 4.7 10*3/uL (ref 1.4–6.5)
NEUTROS PCT: 61 %
Platelets: 232 10*3/uL (ref 150–440)
RBC: 4.75 MIL/uL (ref 3.80–5.20)
RDW: 13.4 % (ref 11.5–14.5)
WBC: 7.8 10*3/uL (ref 3.6–11.0)

## 2016-08-02 LAB — COMPREHENSIVE METABOLIC PANEL
ALBUMIN: 3.8 g/dL (ref 3.5–5.0)
ALT: 31 U/L (ref 14–54)
AST: 29 U/L (ref 15–41)
Alkaline Phosphatase: 84 U/L (ref 38–126)
Anion gap: 4 — ABNORMAL LOW (ref 5–15)
BILIRUBIN TOTAL: 0.5 mg/dL (ref 0.3–1.2)
BUN: 11 mg/dL (ref 6–20)
CHLORIDE: 102 mmol/L (ref 101–111)
CO2: 30 mmol/L (ref 22–32)
Calcium: 8.9 mg/dL (ref 8.9–10.3)
Creatinine, Ser: 0.87 mg/dL (ref 0.44–1.00)
GFR calc Af Amer: >60 mL/min (ref >60)
GFR calc non Af Amer: >60 mL/min (ref >60)
GLUCOSE: 94 mg/dL (ref 65–99)
POTASSIUM: 4.2 mmol/L (ref 3.5–5.1)
SODIUM: 136 mmol/L (ref 135–145)
TOTAL PROTEIN: 8.2 g/dL — AB (ref 6.5–8.1)

## 2016-08-02 LAB — LIPASE, BLOOD: Lipase: 19 U/L (ref 11–51)

## 2016-08-02 LAB — TROPONIN I
Troponin I: <0.03 ng/mL (ref <0.03)
Troponin I: <0.03 ng/mL (ref <0.03)

## 2016-08-02 LAB — PREGNANCY, URINE: Preg Test, Ur: NEGATIVE

## 2016-08-02 LAB — POCT PREGNANCY, URINE: Preg Test, Ur: NEGATIVE

## 2016-08-02 NOTE — Discharge Instructions (Signed)
Please [facility] with [facility]. Please return if you're worse particular if the pain comes on with exertion and does not resolve after resting for 5 or 10 minutes. Please also return if she becomes very short of breath or running a fever or began having vomiting and cannot keep down fluids.

## 2016-08-02 NOTE — ED Provider Notes (Signed)
[facility] Emergency Department Provider Note   ____________________________________________   First MD Initiated Contact with Patient 08/02/16 1008     (approximate)  I have reviewed the triage vital signs and the nursing notes.   HISTORY  Chief Complaint Chest Pain    HPI Lisa House is a 25 y.o. female patient complains of a dull ache in the chest back and occasionally abdomen. She reports it comes and goes without regarding to eating exercise or any other activity. Occasionally involves some shortness of breath but not usually. Nothing she does seems to make it better or worse. Doesn't seem to last very long. Patient reports there is no smoking history. She does not have coughing with that she does not have nausea vomiting or diarrhea with it she cannot describe the pain is more than a dull ache. She reports it has been present for a couple days. She told the nurse it was only since this morning. Pain is not pleuritic in nature.  Past Medical History:  Diagnosis Date  . Allergy   . Depression   . Eczema   . GERD (gastroesophageal reflux disease)   . Intertrigo   . Irritable bowel syndrome   . OCD (obsessive compulsive disorder)   . Panic attack   . Polycystic ovarian disease   . Skin lesion     Patient Active Problem List   Diagnosis Date Noted  . IBS (irritable bowel syndrome) 05/09/2015  . Extreme obesity (HCC) 05/09/2015  . OCD (obsessive compulsive disorder) 05/09/2015  . Bilateral polycystic ovarian syndrome 05/09/2015  . Psoriasis 05/09/2015  . History of depression 05/09/2015  . Allergic rhinitis 05/02/2010    Past Surgical History:  Procedure Laterality Date  . CHOLECYSTECTOMY  01/09/2011   Dr. Excell Seltzer    Prior to Admission medications   Medication Sig Start Date End Date Taking? Authorizing Provider  desonide (DESOWEN) 0.05 % cream Apply topically 2 (two) times daily. 01/21/16   [name], MD  fluticasone (FLONASE)  50 MCG/ACT nasal spray Place 2 sprays into both nostrils daily. 01/21/16   [name], MD  magic mouthwash w/lidocaine SOLN Take 15 mLs by mouth 3 (three) times daily as needed for mouth pain. 01/21/16   [name], MD  metFORMIN (GLUCOPHAGE) 1000 MG tablet Take 1 tablet (1,000 mg total) by mouth 2 (two) times daily with a meal. 01/21/16   [name], MD  norethindrone-ethinyl estradiol (MICROGESTIN,JUNEL,LOESTRIN) 1-20 MG-MCG tablet Take 1 tablet by mouth daily.    Historical Provider, MD    Allergies Review of patient's allergies indicates no known allergies.  Family History  Problem Relation Age of Onset  . Hypertension Mother   . Diabetes Mother   . Headache Mother   . Hypertension Father   . Lung cancer Father   . Heart disease Son     Aortic Valve Disorder Stenosis-repaired    Social History Social History  Substance Use Topics  . Smoking status: Never Smoker  . Smokeless tobacco: Never Used  . Alcohol use 0.0 oz/week     Comment: occasional    Review of Systems Constitutional: No fever/chills Eyes: No visual changes. ENT: No sore throat. Cardiovascular: See history of present illness Respiratory: See history of present illness Gastrointestinal: See history of present illness Genitourinary: Negative for dysuria. Musculoskeletal: Negative for back pain. Skin: Negative for rash. Neurological: Negative for headaches, focal weakness or numbness.  10-point ROS otherwise negative.  ____________________________________________   PHYSICAL EXAM:  VITAL SIGNS: ED Triage  Vitals  Enc Vitals Group     BP 08/02/16 0952 129/61     Pulse Rate 08/02/16 0952 62     Resp 08/02/16 0952 18     Temp 08/02/16 0952 97.8 F (36.6 C)     Temp Source 08/02/16 0952 Oral     SpO2 08/02/16 0952 100 %     Weight 08/02/16 0951 240 lb (108.9 kg)     Height 08/02/16 0951 5\' 5"  (1.651 m)     Head Circumference --      Peak Flow --      Pain Score 08/02/16 0951 4     Pain  Loc --      Pain Edu? --      Excl. in GC? --     Constitutional: Alert and oriented. Well appearing and in no acute distress. Eyes: Conjunctivae are normal. PERRL. EOMI. Head: Atraumatic. Nose: No congestion/rhinnorhea. Mouth/Throat: Mucous membranes are moist.  Oropharynx non-erythematous. Neck: No stridor. Cardiovascular: Normal rate, regular rhythm. Grossly normal heart sounds.  Good peripheral circulation. Respiratory: Normal respiratory effort.  No retractions. Lungs CTAB. No tenderness to palpation Gastrointestinal: Soft and nontender to percussion or palpation. No distention. No abdominal bruits. No CVA tenderness. Musculoskeletal: No lower extremity tenderness nor edema.  No joint effusions. Neurologic:  Normal speech and language. No gross focal neurologic deficits are appreciated. No gait instability. Skin:  Skin is warm, dry and intact. No rash noted.   ____________________________________________   LABS (all labs ordered are listed, but only abnormal results are displayed)  Labs Reviewed  COMPREHENSIVE METABOLIC PANEL - Abnormal; Notable for the following:       Result Value   Total Protein 8.2 (*)    Anion gap 4 (*)    All other components within normal limits  CBC WITH DIFFERENTIAL/PLATELET - Abnormal; Notable for the following:    MCV 77.1 (*)    All other components within normal limits  LIPASE, BLOOD  TROPONIN I  PREGNANCY, URINE  TROPONIN I  POCT PREGNANCY, URINE   ____________________________________________  EKG  EKG read and interpreted by me shows normal sinus rhythm rate of 61 normal axis no acute ST-T wave changes ____________________________________________  RADIOLOGY CLINICAL DATA:  Left posterior and upper abdominal pain. Previous cholecystectomy.  EXAM: ABDOMEN - 1 VIEW  COMPARISON:  Pelvis radiograph dated 06/16/2011.  FINDINGS: Normal bowel gas pattern. Normal appearing bones. Cholecystectomy clips.  IMPRESSION: Normal  examination, status post cholecystectomy.   Electronically Signed   By: Beckie SaltsSteven  Reid M.D.   On: 08/02/2016 11:24 ______________CLINICAL DATA:  Chest pain, nausea and shortness of breath. Previous cholecystectomy.  EXAM: PORTABLE CHEST 1 VIEW  COMPARISON:  Thoracic spine dated 12/27/2010.  FINDINGS: The heart size and mediastinal contours are within normal limits. Both lungs are clear. The visualized skeletal structures are unremarkable.  IMPRESSION: Normal examination.   Electronically Signed   By: Beckie SaltsSteven  Reid M.D.   On: 08/02/2016 11:26______________________________   PROCEDURES  Procedure(s) performed:   Procedures  Critical Care performed:   ____________________________________________   INITIAL IMPRESSION / ASSESSMENT AND PLAN / ED COURSE  Pertinent labs & imaging results that were available during my care of the patient were reviewed by me and considered in my medical decision making (see chart for details).    Clinical Course     ____________________________________________   FINAL CLINICAL IMPRESSION(S) / ED DIAGNOSES  Final diagnoses:  Chest pain, unspecified chest pain type  Abdominal pain, unspecified abdominal location  NEW MEDICATIONS STARTED DURING THIS VISIT:  New Prescriptions   No medications on file     Note:  This document was prepared using Dragon voice recognition software and may include unintentional dictation errors.    Arnaldo Natal, MD 08/02/16 1329

## 2016-08-02 NOTE — ED Triage Notes (Signed)
Pt with chest pain started this am with some nausea, also reports shortness of breath. NAD, skin warm and dry

## 2016-09-22 ENCOUNTER — Emergency Department
Admission: EM | Admit: 2016-09-22 | Discharge: 2016-09-22 | Disposition: A | Discharge status: Home / Self Care | Payer: 59 | Attending: Emergency Medicine | Admitting: Emergency Medicine

## 2016-09-22 ENCOUNTER — Emergency Department: Payer: 59

## 2016-09-22 ENCOUNTER — Encounter: Payer: Self-pay | Admitting: Emergency Medicine

## 2016-09-22 DIAGNOSIS — O26851 Spotting complicating pregnancy, first trimester: Secondary | ICD-10-CM | POA: Diagnosis not present

## 2016-09-22 DIAGNOSIS — R51 Headache: Secondary | ICD-10-CM | POA: Diagnosis not present

## 2016-09-22 DIAGNOSIS — Z79899 Other long term (current) drug therapy: Secondary | ICD-10-CM | POA: Insufficient documentation

## 2016-09-22 DIAGNOSIS — Z7984 Long term (current) use of oral hypoglycemic drugs: Secondary | ICD-10-CM | POA: Diagnosis not present

## 2016-09-22 DIAGNOSIS — Z3A01 Less than 8 weeks gestation of pregnancy: Secondary | ICD-10-CM | POA: Diagnosis not present

## 2016-09-22 DIAGNOSIS — O2341 Unspecified infection of urinary tract in pregnancy, first trimester: Secondary | ICD-10-CM | POA: Diagnosis not present

## 2016-09-22 DIAGNOSIS — R1031 Right lower quadrant pain: Secondary | ICD-10-CM | POA: Diagnosis not present

## 2016-09-22 DIAGNOSIS — O4690 Antepartum hemorrhage, unspecified, unspecified trimester: Secondary | ICD-10-CM

## 2016-09-22 DIAGNOSIS — N9489 Other specified conditions associated with female genital organs and menstrual cycle: Secondary | ICD-10-CM | POA: Insufficient documentation

## 2016-09-22 DIAGNOSIS — N390 Urinary tract infection, site not specified: Secondary | ICD-10-CM

## 2016-09-22 DIAGNOSIS — O209 Hemorrhage in early pregnancy, unspecified: Secondary | ICD-10-CM | POA: Diagnosis not present

## 2016-09-22 LAB — ABO/RH: ABO/RH(D): O POS

## 2016-09-22 LAB — URINALYSIS COMPLETE WITH MICROSCOPIC (ARMC ONLY)
BILIRUBIN URINE: NEGATIVE
GLUCOSE, UA: NEGATIVE mg/dL
Ketones, ur: NEGATIVE mg/dL
NITRITE: NEGATIVE
Protein, ur: NEGATIVE mg/dL
Specific Gravity, Urine: 1.004 — ABNORMAL LOW (ref 1.005–1.030)
pH: 6 (ref 5.0–8.0)

## 2016-09-22 LAB — WET PREP, GENITAL
Clue Cells Wet Prep HPF POC: NONE SEEN
SPERM: NONE SEEN
TRICH WET PREP: NONE SEEN
Yeast Wet Prep HPF POC: NONE SEEN

## 2016-09-22 LAB — HCG, QUANTITATIVE, PREGNANCY: HCG, BETA CHAIN, QUANT, S: 4523 m[IU]/mL — AB (ref <5)

## 2016-09-22 LAB — POCT PREGNANCY, URINE: PREG TEST UR: POSITIVE — AB

## 2016-09-22 LAB — CHLAMYDIA/NGC RT PCR (ARMC ONLY)
Chlamydia Tr: NOT DETECTED
N gonorrhoeae: NOT DETECTED

## 2016-09-22 MED ORDER — CEPHALEXIN 500 MG PO CAPS: 500.0000 mg | ORAL_CAPSULE | Freq: Three times a day (TID) | ORAL | 0 refills | Status: AC

## 2016-09-22 MED ORDER — CEPHALEXIN 500 MG PO CAPS
500.0000 mg | ORAL_CAPSULE | Freq: Once | ORAL | Status: AC
  Administered 2016-09-22: 500 mg via ORAL
  Filled 2016-09-22: qty 1

## 2016-09-22 MED ORDER — CONCEPT DHA 53.5-38-1 MG PO CAPS: 1.0000 | ORAL_CAPSULE | Freq: Every day | ORAL | 0 refills | Status: DC

## 2016-09-22 NOTE — ED Triage Notes (Signed)
Patient states she found out she was pregnant 4 days ago via urine test and began having vaginal bleeding today. Patient states she had abdominal cramping yesterday. Patient states the bleeding has mostly been light pink with mucous.

## 2016-09-22 NOTE — ED Notes (Signed)
Discharge instructions reviewed with patient. Patient verbalized understanding. Patient ambulated to lobby without difficulty.   

## 2016-09-22 NOTE — ED Notes (Signed)
She is currently in [place] 

## 2016-09-22 NOTE — ED Provider Notes (Addendum)
[facility] Emergency Department Provider Note  ____________________________________________   First MD Initiated Contact with Patient 09/22/16 1631     (approximate)  I have reviewed the triage vital signs and the nursing notes.   HISTORY  Chief Complaint Vaginal Bleeding   HPI Lisa House is a 25 y.o. female who is a G3 P2 with a history of subchorionic hemorrhage was present. Emergency today with vaginal bleeding. She says that she has had a pink mucus-like discharge since earlier this morning. She says that she just found out she was pregnant 4 days ago and had some abdominal cramping yesterday but denies any pain today. Denies any nausea or vomiting. Denies any history of STDs. Has had a subchorionic hemorrhage and past pregnancy and was able to carry the child to term.   Past Medical History:  Diagnosis Date  . Allergy   . Depression   . Eczema   . GERD (gastroesophageal reflux disease)   . Intertrigo   . Irritable bowel syndrome   . OCD (obsessive compulsive disorder)   . Panic attack   . Polycystic ovarian disease   . Skin lesion     Patient Active Problem List   Diagnosis Date Noted  . IBS (irritable bowel syndrome) 05/09/2015  . Extreme obesity (HCC) 05/09/2015  . OCD (obsessive compulsive disorder) 05/09/2015  . Bilateral polycystic ovarian syndrome 05/09/2015  . Psoriasis 05/09/2015  . History of depression 05/09/2015  . Allergic rhinitis 05/02/2010    Past Surgical History:  Procedure Laterality Date  . CHOLECYSTECTOMY  01/09/2011   Dr. Excell Seltzer    Prior to Admission medications   Medication Sig Start Date End Date Taking? Authorizing Provider  desonide (DESOWEN) 0.05 % cream Apply topically 2 (two) times daily. 01/21/16   [name], MD  fluticasone (FLONASE) 50 MCG/ACT nasal spray Place 2 sprays into both nostrils daily. 01/21/16   [name], MD  magic mouthwash w/lidocaine SOLN Take 15 mLs by mouth 3 (three)  times daily as needed for mouth pain. 01/21/16   [name], MD  metFORMIN (GLUCOPHAGE) 1000 MG tablet Take 1 tablet (1,000 mg total) by mouth 2 (two) times daily with a meal. 01/21/16   [name], MD  norethindrone-ethinyl estradiol (MICROGESTIN,JUNEL,LOESTRIN) 1-20 MG-MCG tablet Take 1 tablet by mouth daily.    Historical Provider, MD    Allergies Patient has no known allergies.  Family History  Problem Relation Age of Onset  . Hypertension Mother   . Diabetes Mother   . Headache Mother   . Hypertension Father   . Lung cancer Father   . Heart disease Son     Aortic Valve Disorder Stenosis-repaired    Social History Social History  Substance Use Topics  . Smoking status: Never Smoker  . Smokeless tobacco: Never Used  . Alcohol use No    Review of Systems Constitutional: No fever/chills Eyes: No visual changes. ENT: No sore throat. Cardiovascular: Denies chest pain. Respiratory: Denies shortness of breath. Gastrointestinal:No nausea, no vomiting.  No diarrhea.  No constipation. Genitourinary: Negative for dysuria. Musculoskeletal: Negative for back pain. Skin: Negative for rash. Neurological: Negative for headaches, focal weakness or numbness.  10-point ROS otherwise negative.  ____________________________________________   PHYSICAL EXAM:  VITAL SIGNS: ED Triage Vitals [09/22/16 1439]  Enc Vitals Group     BP 137/73     Pulse Rate 64     Resp 16     Temp 98.4 F (36.9 C)     Temp Source  Oral     SpO2 100 %     Weight 247 lb (112 kg)     Height 5\' 6"  (1.676 m)     Head Circumference      Peak Flow      Pain Score      Pain Loc      Pain Edu?      Excl. in GC?     Constitutional: Alert and oriented. Well appearing and in no acute distress. Eyes: Conjunctivae are normal. PERRL. EOMI. Head: Atraumatic. Nose: No congestion/rhinnorhea. Mouth/Throat: Mucous membranes are moist.   Neck: No stridor.   Cardiovascular: Normal rate, regular rhythm.  Grossly normal heart sounds.   Respiratory: Normal respiratory effort.  No retractions. Lungs CTAB. Gastrointestinal: Soft and nontender. No distention. No abdominal bruits. No CVA tenderness. Genitourinary:  Normal external exam. Speculum exam with a small amount of cervical discharge. No masses identified. No bleeding. No blood in the uterus. Cervical os is closed on bimanual exam. No CMT. No uterine or adnexal tenderness nor masses. Musculoskeletal: No lower extremity tenderness nor edema.  No joint effusions. Neurologic:  Normal speech and language. No gross focal neurologic deficits are appreciated.  Skin:  Skin is warm, dry and intact. No rash noted. Psychiatric: Mood and affect are normal. Speech and behavior are normal.  ____________________________________________   LABS (all labs ordered are listed, but only abnormal results are displayed)  Labs Reviewed  WET PREP, GENITAL - Abnormal; Notable for the following:       Result Value   WBC, Wet Prep HPF POC MODERATE (*)    All other components within normal limits  HCG, QUANTITATIVE, PREGNANCY - Abnormal; Notable for the following:    hCG, Beta Chain, Quant, S 4,523 (*)    All other components within normal limits  URINALYSIS COMPLETEWITH MICROSCOPIC ([facility] ONLY) - Abnormal; Notable for the following:    Color, Urine STRAW (*)    APPearance HAZY (*)    Specific Gravity, Urine 1.004 (*)    Hgb urine dipstick 2+ (*)    Leukocytes, UA 1+ (*)    Bacteria, UA RARE (*)    Squamous Epithelial / LPF 0-5 (*)    All other components within normal limits  POCT PREGNANCY, URINE - Abnormal; Notable for the following:    Preg Test, Ur POSITIVE (*)    All other components within normal limits  CHLAMYDIA/NGC RT PCR ([facility] ONLY)  ABO/RH   ____________________________________________  EKG   ____________________________________________  RADIOLOGY  US OB Transvaginal (Final result)  Result time 09/22/16 18:11:53  Final  result by Signa Kellaylor Stroud, MD (09/22/16 18:11:53)           Narrative:   CLINICAL DATA: Vaginal bleeding and spotting.  EXAM: OBSTETRIC <14 WK US AND TRANSVAGINAL OB US  TECHNIQUE: Both transabdominal and transvaginal ultrasound examinations were performed for complete evaluation of the gestation as well as the maternal uterus, adnexal regions, and pelvic cul-de-sac. Transvaginal technique was performed to assess early pregnancy.  COMPARISON: None.  FINDINGS: Intrauterine gestational sac: Single  Yolk sac: Visualized.  Embryo: Not Visualized.  Cardiac Activity: Not Visualized.  MSD: 5.9 mm  5 w  2 d  Maternal uterus/adnexae:  Subchorionic hemorrhage: None  Right ovary: Normal. Contains a corpus luteal cyst.  Left ovary: Normal  Other :None  Free fluid: None  IMPRESSION: 1. Probable early intrauterine gestational sac containing a yolk sac, but no fetal pole, or cardiac activity yet visualized. Recommend follow-up quantitative B-HCG levels and follow-up  US in 14 days to confirm and assess viability. This recommendation follows SRU consensus guidelines: Diagnostic Criteria for Nonviable Pregnancy Early in the First Trimester. Malva Limes Engl J Med 2013; 161:0960-45; 369:1443-51.   Electronically Signed By: Signa Kellaylor Stroud M.D. On: 09/22/2016 18:11            US OB Comp Less 14 Wks (Final result)  Result time 09/22/16 17:55:18  Final result by Norva PavlovElizabeth Brown, MD (09/22/16 17:55:18)           Narrative:   CLINICAL DATA: Vaginal bleeding and spotting for 1 day. Quantitative beta HCG 4,523.  LMP was09/30/2017.  Gestational age by LMP is6 weeks 2 days.  EDC by LMP is07/05/2017.  EXAM: OBSTETRIC <14 WK US AND TRANSVAGINAL OB US  TECHNIQUE: Both transabdominal and transvaginal ultrasound examinations were performed for complete evaluation of the gestation as well as the maternal uterus, adnexal regions, and pelvic cul-de-sac. Transvaginal technique  was performed to assess early pregnancy.  COMPARISON: None applicable  FINDINGS: Intrauterine gestational sac: Present  Yolk sac: Present  Embryo: Not seen  Cardiac Activity: Not seen  MSD: 5.9 mm  5 w  2 d  Subchorionic hemorrhage: No subchorionic hemorrhage.  Maternal uterus/adnexae: Right ovarian corpus luteum cyst is 2.1 x 2.0 x 2.0 cm. Left ovary is normal in appearance. No free pelvic fluid.  IMPRESSION: 1. Intrauterine gestational sac is present. Embryo is not yet visualized. 2. Follow-up ultrasound is recommended in 14 or more days to document presence of fetal pole and for dating purposes.   Electronically Signed By: Norva PavlovElizabeth Brown M.D. On: 09/22/2016 17:55            ____________________________________________   PROCEDURES  Procedure(s) performed:   Procedures  Critical Care performed:   ____________________________________________   INITIAL IMPRESSION / ASSESSMENT AND PLAN / ED COURSE  Pertinent labs & imaging results that were available during my care of the patient were reviewed by me and considered in my medical decision making (see chart for details).  ----------------------------------------- 7:31 PM on 09/22/2016 -----------------------------------------  Possible UTI. Otherwise very reassuring workup. No subchorionic hemorrhage visualized on the ultrasound. IUP but too early for fetal pole or heart rate. I explained all the results with the patient. We will start her on an antibiotic for a possible UTI as well as prenatal vitamins. She was following up with Dr. Greggory Keenefrancesco at [facility]. She is understanding of the plan and willing to comply. She knows to return for any worsening or concerning symptoms.  Clinical Course      ____________________________________________   FINAL CLINICAL IMPRESSION(S) / ED DIAGNOSES  UTI. Vaginal bleeding in first trimester pregnancy.    NEW MEDICATIONS STARTED DURING THIS  VISIT:  New Prescriptions   No medications on file     Note:  This document was prepared using Dragon voice recognition software and may include unintentional dictation errors.    Myrna Blazeravid Matthew Vania Rosero, MD 09/22/16 1932  Correction to above. [facility]    Myrna Blazeravid Matthew Malkie Wille, MD 09/22/16 [phone]

## 2016-09-24 DIAGNOSIS — Z3201 Encounter for pregnancy test, result positive: Secondary | ICD-10-CM | POA: Diagnosis not present

## 2016-09-24 LAB — URINE CULTURE: CULTURE: NO GROWTH

## 2016-09-27 DIAGNOSIS — R21 Rash and other nonspecific skin eruption: Secondary | ICD-10-CM | POA: Diagnosis not present

## 2016-10-03 DIAGNOSIS — R110 Nausea: Secondary | ICD-10-CM | POA: Diagnosis not present

## 2016-10-03 DIAGNOSIS — L304 Erythema intertrigo: Secondary | ICD-10-CM | POA: Diagnosis not present

## 2016-10-07 ENCOUNTER — Encounter: Payer: Self-pay | Admitting: Emergency Medicine

## 2016-10-07 ENCOUNTER — Emergency Department: Payer: 59

## 2016-10-07 ENCOUNTER — Emergency Department
Admission: EM | Admit: 2016-10-07 | Discharge: 2016-10-07 | Disposition: A | Discharge status: Home / Self Care | Payer: 59 | Source: Home / Self Care

## 2016-10-07 ENCOUNTER — Emergency Department
Admission: EM | Admit: 2016-10-07 | Discharge: 2016-10-07 | Disposition: A | Discharge status: Home / Self Care | Payer: 59 | Attending: Emergency Medicine | Admitting: Emergency Medicine

## 2016-10-07 DIAGNOSIS — O209 Hemorrhage in early pregnancy, unspecified: Secondary | ICD-10-CM | POA: Insufficient documentation

## 2016-10-07 DIAGNOSIS — Z3A08 8 weeks gestation of pregnancy: Secondary | ICD-10-CM | POA: Insufficient documentation

## 2016-10-07 DIAGNOSIS — Z79899 Other long term (current) drug therapy: Secondary | ICD-10-CM | POA: Insufficient documentation

## 2016-10-07 DIAGNOSIS — Z3A01 Less than 8 weeks gestation of pregnancy: Secondary | ICD-10-CM | POA: Diagnosis not present

## 2016-10-07 DIAGNOSIS — O200 Threatened abortion: Secondary | ICD-10-CM | POA: Diagnosis not present

## 2016-10-07 DIAGNOSIS — Z5321 Procedure and treatment not carried out due to patient leaving prior to being seen by health care provider: Secondary | ICD-10-CM | POA: Insufficient documentation

## 2016-10-07 DIAGNOSIS — O208 Other hemorrhage in early pregnancy: Secondary | ICD-10-CM | POA: Diagnosis not present

## 2016-10-07 DIAGNOSIS — Z7984 Long term (current) use of oral hypoglycemic drugs: Secondary | ICD-10-CM | POA: Insufficient documentation

## 2016-10-07 LAB — URINALYSIS COMPLETE WITH MICROSCOPIC (ARMC ONLY)
BACTERIA UA: NONE SEEN
Bilirubin Urine: NEGATIVE
GLUCOSE, UA: NEGATIVE mg/dL
KETONES UR: NEGATIVE mg/dL
Leukocytes, UA: NEGATIVE
NITRITE: NEGATIVE
Protein, ur: NEGATIVE mg/dL
SPECIFIC GRAVITY, URINE: 1.023 (ref 1.005–1.030)
pH: 6 (ref 5.0–8.0)

## 2016-10-07 LAB — HCG, QUANTITATIVE, PREGNANCY: hCG, Beta Chain, Quant, S: 74421 m[IU]/mL — ABNORMAL HIGH (ref <5)

## 2016-10-07 NOTE — ED Notes (Signed)
Pt in via triage with complaints of vaginal bleeding since this morning; pt reports abdominal cramping over the last week, denies any other pain/symptoms.  Pt states she is approximately [redacted] weeks pregnant but has not been seen by OB-GYN yet.  Pt reports hx of same throughout first three months of previous pregnancy.  Pt A/Ox4, ambulatory to room, no immediate distress noted at this time.

## 2016-10-07 NOTE — ED Notes (Signed)
Pt. Verbalizes understanding of d/c instructions and follow-up. VS stable and pain controlled per pt.  Pt. In NAD at time of d/c and denies further concerns regarding this visit. Pt. Stable at the time of departure from the unit, departing unit by the safest and most appropriate manner per that pt condition and limitations. Pt advised to return to the ED at any time for emergent concerns, or for new/worsening symptoms.   

## 2016-10-07 NOTE — ED Triage Notes (Signed)
Pt to ed with c/o vaginal bleeding, reports brown d/c noted in underwear this am.  Also reports pink color noted on toilet tissue when wiping.  Pt states mild cramping.  States she is approx [redacted] weeks pregnant.

## 2016-10-07 NOTE — ED Provider Notes (Signed)
-----------------------------------------   11:40 AM on 10/07/2016 -----------------------------------------  The patient checked in and was put immediately in an exam room. I ordered the standard workup, but then she apparently got a phone call that she needed to pick up her son from school and she left without being seen after verbally alerting her nurse.  HCG is 74,421, urinalysis is unremarkable.  Her ED visit two weeks ago demonstrated blood type of O+.  Patient told her nurse that she would come back and check back in later.   Loleta Roseory Lakeitha Basques, MD 10/07/16 1141

## 2016-10-07 NOTE — ED Provider Notes (Signed)
[facility] Emergency Department Provider Note  ____________________________________________   First MD Initiated Contact with Patient 10/07/16 1631     (approximate)  I have reviewed the triage vital signs and the nursing notes.   HISTORY  Chief Complaint Vaginal Bleeding   HPI Lisa House is a 25 y.o. female Who is a G3 P2 presented a weeks pregnant with vaginal bleeding. She says that the bleeding started this morning and was a brown discharge. She says it has decreased since then and has changed to a pink discharge. She says that she has some intermittent abdominal cramping but no pain at this time. Denies any nausea vomiting or diarrhea. Has a history of asubchorionic hemorrhage and a previous pregnancy. I evaluated this patient several weeks ago and she was found to have an intrauterine pregnancy at that time. No subchronic hemorrhage was discovered. She says that since then she is not been doing any heavy lifting and has not been having any vaginal intercourse. She says that she is taking prenatal vitamins a is still trying to establish care [facility].   Past Medical History:  Diagnosis Date  . Allergy   . Depression   . Eczema   . GERD (gastroesophageal reflux disease)   . Intertrigo   . Irritable bowel syndrome   . OCD (obsessive compulsive disorder)   . Panic attack   . Polycystic ovarian disease   . Skin lesion     Patient Active Problem List   Diagnosis Date Noted  . IBS (irritable bowel syndrome) 05/09/2015  . Extreme obesity (HCC) 05/09/2015  . OCD (obsessive compulsive disorder) 05/09/2015  . Bilateral polycystic ovarian syndrome 05/09/2015  . Psoriasis 05/09/2015  . History of depression 05/09/2015  . Allergic rhinitis 05/02/2010    Past Surgical History:  Procedure Laterality Date  . CHOLECYSTECTOMY  01/09/2011   Dr. Excell Seltzer    Prior to Admission medications   Medication Sig Start Date End Date Taking?  Authorizing Provider  desonide (DESOWEN) 0.05 % cream Apply topically 2 (two) times daily. 01/21/16   [name], MD  fluticasone (FLONASE) 50 MCG/ACT nasal spray Place 2 sprays into both nostrils daily. 01/21/16   [name], MD  magic mouthwash w/lidocaine SOLN Take 15 mLs by mouth 3 (three) times daily as needed for mouth pain. 01/21/16   [name], MD  metFORMIN (GLUCOPHAGE) 1000 MG tablet Take 1 tablet (1,000 mg total) by mouth 2 (two) times daily with a meal. 01/21/16   [name], MD  norethindrone-ethinyl estradiol (MICROGESTIN,JUNEL,LOESTRIN) 1-20 MG-MCG tablet Take 1 tablet by mouth daily.    Historical Provider, MD  Prenat-FeFum-FePo-FA-Omega 3 (CONCEPT DHA) 53.5-38-1 MG CAPS Take 1 tablet by mouth daily. 09/22/16   Myrna Blazer, MD    Allergies Patient has no known allergies.  Family History  Problem Relation Age of Onset  . Hypertension Mother   . Diabetes Mother   . Headache Mother   . Hypertension Father   . Lung cancer Father   . Heart disease Son     Aortic Valve Disorder Stenosis-repaired    Social History Social History  Substance Use Topics  . Smoking status: Never Smoker  . Smokeless tobacco: Never Used  . Alcohol use No    Review of Systems Constitutional: No fever/chills Eyes: No visual changes. ENT: No sore throat. Cardiovascular: Denies chest pain. Respiratory: Denies shortness of breath. Gastrointestinal:  No nausea, no vomiting.  No diarrhea.  No constipation. Genitourinary: Negative for dysuria. Musculoskeletal:  Negative for back pain. Skin: Negative for rash. Neurological: Negative for headaches, focal weakness or numbness.  10-point ROS otherwise negative.  ____________________________________________   PHYSICAL EXAM:  VITAL SIGNS: ED Triage Vitals [10/07/16 1442]  Enc Vitals Group     BP 124/63     Pulse Rate 73     Resp 18     Temp 97.2 F (36.2 C)     Temp Source Oral     SpO2 100 %     Weight       Height      Head Circumference      Peak Flow      Pain Score 2     Pain Loc      Pain Edu?      Excl. in GC?     Constitutional: Alert and oriented. Well appearing and in no acute distress. Eyes: Conjunctivae are normal. PERRL. EOMI. Head: Atraumatic. Nose: No congestion/rhinnorhea. Mouth/Throat: Mucous membranes are moist.   Neck: No stridor.   Cardiovascular: Normal rate, regular rhythm. Grossly normal heart sounds.   Respiratory: Normal respiratory effort.  No retractions. Lungs CTAB. Gastrointestinal: Soft and nontender. No distention. No abdominal bruits. No CVA tenderness. Musculoskeletal: No lower extremity tenderness nor edema.  No joint effusions. Neurologic:  Normal speech and language. No gross focal neurologic deficits are appreciated. No gait instability. Skin:  Skin is warm, dry and intact. No rash noted. Psychiatric: Mood and affect are normal. Speech and behavior are normal.  ____________________________________________   LABS (all labs ordered are listed, but only abnormal results are displayed)  Labs Reviewed - No data to display ____________________________________________  EKG   ____________________________________________  RADIOLOGY  US OB Comp Less 14 Wks (Final result)  Result time 10/07/16 19:01:36  Final result by Janice Coffinavid Sung Kwon, MD (10/07/16 19:01:36)           Narrative:   CLINICAL DATA: Vaginal bleeding in the first trimester pregnancy  EXAM: OBSTETRIC <14 WK US AND TRANSVAGINAL OB US  TECHNIQUE: Both transabdominal and transvaginal ultrasound examinations were performed for complete evaluation of the gestation as well as the maternal uterus, adnexal regions, and pelvic cul-de-sac. Transvaginal technique was performed to assess early pregnancy.  COMPARISON: 09/22/2016  FINDINGS: Intrauterine gestational sac: Single  Yolk sac: Visualized.  Embryo: Visualized.  Cardiac Activity: Visualized.  Heart Rate: 149  bpm  CRL: 11.5 mm  7 w  2 d         US EDC: 05/24/2017  Subchorionic hemorrhage: Small 1.6 x 0.5 x 2.1 cm focus of hemorrhage  Maternal uterus/adnexae: Corpus luteum noted on the right. Normal left ovary.  IMPRESSION: Small subchorionic focus of hemorrhage measuring 1.6 x 0.5 x 2.1 cm. Single live intrauterine gestation at 7 weeks 2 days by crown-rump length.   Electronically Signed By: Tollie Ethavid Kwon M.D. On: 10/07/2016 19:01            US OB Transvaginal (Final result)  Result time 10/07/16 19:01:36  Final result by Janice Coffinavid Sung Kwon, MD (10/07/16 19:01:36)           Narrative:   CLINICAL DATA: Vaginal bleeding in the first trimester pregnancy  EXAM: OBSTETRIC <14 WK US AND TRANSVAGINAL OB US  TECHNIQUE: Both transabdominal and transvaginal ultrasound examinations were performed for complete evaluation of the gestation as well as the maternal uterus, adnexal regions, and pelvic cul-de-sac. Transvaginal technique was performed to assess early pregnancy.  COMPARISON: 09/22/2016  FINDINGS: Intrauterine gestational sac: Single  Yolk sac: Visualized.  Embryo:  Visualized.  Cardiac Activity: Visualized.  Heart Rate: 149 bpm  CRL: 11.5 mm  7 w  2 d         US EDC: 05/24/2017  Subchorionic hemorrhage: Small 1.6 x 0.5 x 2.1 cm focus of hemorrhage  Maternal uterus/adnexae: Corpus luteum noted on the right. Normal left ovary.  IMPRESSION: Small subchorionic focus of hemorrhage measuring 1.6 x 0.5 x 2.1 cm. Single live intrauterine gestation at 7 weeks 2 days by crown-rump length.   Electronically Signed By: Tollie Ethavid Kwon M.D. On: 10/07/2016 19:01            ____________________________________________   PROCEDURES  Procedure(s) performed:   Procedures  Critical Care performed:   ____________________________________________   INITIAL IMPRESSION / ASSESSMENT AND PLAN / ED COURSE  Pertinent labs &  imaging results that were available during my care of the patient were reviewed by me and considered in my medical decision making (see chart for details).  Hormone levels appear to be progressing appropriately. Attempted bedside ultrasound was unable to achieve good visualization of the pregnancy. We will proceed with transvaginal ultrasound.  Clinical Course    ----------------------------------------- 7:09 PM on 10/07/2016 -----------------------------------------  Patient resting comfortably at this time. Ultrasound showing a small subchorionic hemorrhage but a single live IUP. I discussed the lab as well as imaging results with the patient. She'll be following up with an OB/GYN. She will continue pelvic rest precautions as well as IV lifting. She'll continue to take her prenatal vitamins.  ____________________________________________   FINAL CLINICAL IMPRESSION(S) / ED DIAGNOSES  First trimester vaginal bleeding.    NEW MEDICATIONS STARTED DURING THIS VISIT:  New Prescriptions   No medications on file     Note:  This document was prepared using Dragon voice recognition software and may include unintentional dictation errors.    Myrna Blazeravid Matthew Caron Tardif, MD 10/07/16 1910

## 2016-10-07 NOTE — ED Notes (Signed)
Patient dressed, and at nurses station. Patient spoke to this RN stating, "I just got a call from the school and I have to go get my son". Patient refusing to have vital signs taken or to sign e signature pad.

## 2016-10-07 NOTE — ED Triage Notes (Signed)
Pt to ed with c/o vaginal bleeding this am.  Pt states she was here earlier but had to leave and go pick up child from school.  Pt is approx [redacted] weeks pregnant.

## 2016-10-08 LAB — URINE CULTURE
Culture: NO GROWTH
Special Requests: NORMAL

## 2016-10-17 DIAGNOSIS — Z131 Encounter for screening for diabetes mellitus: Secondary | ICD-10-CM | POA: Diagnosis not present

## 2016-10-17 DIAGNOSIS — Z6841 Body Mass Index (BMI) 40.0 and over, adult: Secondary | ICD-10-CM | POA: Diagnosis not present

## 2016-10-17 DIAGNOSIS — Z3490 Encounter for supervision of normal pregnancy, unspecified, unspecified trimester: Secondary | ICD-10-CM | POA: Diagnosis not present

## 2016-10-17 DIAGNOSIS — E669 Obesity, unspecified: Secondary | ICD-10-CM | POA: Diagnosis not present

## 2016-10-17 DIAGNOSIS — O99210 Obesity complicating pregnancy, unspecified trimester: Secondary | ICD-10-CM | POA: Diagnosis not present

## 2016-10-17 DIAGNOSIS — Z124 Encounter for screening for malignant neoplasm of cervix: Secondary | ICD-10-CM | POA: Diagnosis not present

## 2016-10-17 DIAGNOSIS — Z113 Encounter for screening for infections with a predominantly sexual mode of transmission: Secondary | ICD-10-CM | POA: Diagnosis not present

## 2016-10-17 DIAGNOSIS — B009 Herpesviral infection, unspecified: Secondary | ICD-10-CM | POA: Diagnosis not present

## 2016-10-17 LAB — OB RESULTS CONSOLE VARICELLA ZOSTER ANTIBODY, IGG: Varicella: NON-IMMUNE/NOT IMMUNE

## 2016-10-17 LAB — OB RESULTS CONSOLE RUBELLA ANTIBODY, IGM: RUBELLA: IMMUNE

## 2016-10-17 LAB — OB RESULTS CONSOLE HIV ANTIBODY (ROUTINE TESTING): HIV: NONREACTIVE

## 2016-10-17 LAB — OB RESULTS CONSOLE RPR: RPR: NONREACTIVE

## 2016-10-17 LAB — OB RESULTS CONSOLE HEPATITIS B SURFACE ANTIGEN: Hepatitis B Surface Ag: NEGATIVE

## 2016-10-17 LAB — OB RESULTS CONSOLE ANTIBODY SCREEN: ANTIBODY SCREEN: NEGATIVE

## 2016-10-17 LAB — OB RESULTS CONSOLE GC/CHLAMYDIA
Chlamydia: NEGATIVE
Gonorrhea: NEGATIVE

## 2016-10-28 DIAGNOSIS — Z3687 Encounter for antenatal screening for uncertain dates: Secondary | ICD-10-CM | POA: Diagnosis not present

## 2016-10-28 DIAGNOSIS — O26851 Spotting complicating pregnancy, first trimester: Secondary | ICD-10-CM | POA: Diagnosis not present

## 2016-10-28 DIAGNOSIS — Z3A10 10 weeks gestation of pregnancy: Secondary | ICD-10-CM | POA: Diagnosis not present

## 2016-10-29 ENCOUNTER — Encounter: Payer: Self-pay | Admitting: *Deleted

## 2016-10-29 ENCOUNTER — Emergency Department: Payer: 59

## 2016-10-29 ENCOUNTER — Emergency Department
Admission: EM | Admit: 2016-10-29 | Discharge: 2016-10-29 | Disposition: A | Discharge status: Home / Self Care | Payer: 59 | Attending: Emergency Medicine | Admitting: Emergency Medicine

## 2016-10-29 DIAGNOSIS — R102 Pelvic and perineal pain: Secondary | ICD-10-CM | POA: Diagnosis not present

## 2016-10-29 DIAGNOSIS — Y939 Activity, unspecified: Secondary | ICD-10-CM | POA: Diagnosis not present

## 2016-10-29 DIAGNOSIS — W010XXA Fall on same level from slipping, tripping and stumbling without subsequent striking against object, initial encounter: Secondary | ICD-10-CM | POA: Insufficient documentation

## 2016-10-29 DIAGNOSIS — O26891 Other specified pregnancy related conditions, first trimester: Secondary | ICD-10-CM | POA: Diagnosis not present

## 2016-10-29 DIAGNOSIS — Y999 Unspecified external cause status: Secondary | ICD-10-CM | POA: Diagnosis not present

## 2016-10-29 DIAGNOSIS — Y92007 Garden or yard of unspecified non-institutional (private) residence as the place of occurrence of the external cause: Secondary | ICD-10-CM | POA: Insufficient documentation

## 2016-10-29 DIAGNOSIS — O9A211 Injury, poisoning and certain other consequences of external causes complicating pregnancy, first trimester: Secondary | ICD-10-CM | POA: Insufficient documentation

## 2016-10-29 DIAGNOSIS — Z3A10 10 weeks gestation of pregnancy: Secondary | ICD-10-CM | POA: Diagnosis not present

## 2016-10-29 DIAGNOSIS — Z3A11 11 weeks gestation of pregnancy: Secondary | ICD-10-CM | POA: Diagnosis not present

## 2016-10-29 DIAGNOSIS — R1030 Lower abdominal pain, unspecified: Secondary | ICD-10-CM | POA: Diagnosis not present

## 2016-10-29 DIAGNOSIS — W19XXXA Unspecified fall, initial encounter: Secondary | ICD-10-CM

## 2016-10-29 DIAGNOSIS — S3991XA Unspecified injury of abdomen, initial encounter: Secondary | ICD-10-CM | POA: Insufficient documentation

## 2016-10-29 LAB — COMPREHENSIVE METABOLIC PANEL
ALBUMIN: 3.5 g/dL (ref 3.5–5.0)
ALK PHOS: 68 U/L (ref 38–126)
ALT: 19 U/L (ref 14–54)
AST: 20 U/L (ref 15–41)
Anion gap: 6 (ref 5–15)
BILIRUBIN TOTAL: 0.5 mg/dL (ref 0.3–1.2)
BUN: 8 mg/dL (ref 6–20)
CALCIUM: 9.1 mg/dL (ref 8.9–10.3)
CO2: 27 mmol/L (ref 22–32)
CREATININE: 0.53 mg/dL (ref 0.44–1.00)
Chloride: 101 mmol/L (ref 101–111)
GFR calc Af Amer: >60 mL/min (ref >60)
GLUCOSE: 92 mg/dL (ref 65–99)
Potassium: 3.9 mmol/L (ref 3.5–5.1)
Sodium: 134 mmol/L — ABNORMAL LOW (ref 135–145)
TOTAL PROTEIN: 7.4 g/dL (ref 6.5–8.1)

## 2016-10-29 LAB — URINALYSIS, COMPLETE (UACMP) WITH MICROSCOPIC
BILIRUBIN URINE: NEGATIVE
GLUCOSE, UA: NEGATIVE mg/dL
Ketones, ur: NEGATIVE mg/dL
NITRITE: NEGATIVE
PH: 6 (ref 5.0–8.0)
Protein, ur: NEGATIVE mg/dL
SPECIFIC GRAVITY, URINE: 1.008 (ref 1.005–1.030)

## 2016-10-29 LAB — HCG, QUANTITATIVE, PREGNANCY: hCG, Beta Chain, Quant, S: 117302 m[IU]/mL — ABNORMAL HIGH (ref <5)

## 2016-10-29 LAB — CBC
HEMATOCRIT: 34 % — AB (ref 35.0–47.0)
Hemoglobin: 11.6 g/dL — ABNORMAL LOW (ref 12.0–16.0)
MCH: 26 pg (ref 26.0–34.0)
MCHC: 34.2 g/dL (ref 32.0–36.0)
MCV: 76 fL — ABNORMAL LOW (ref 80.0–100.0)
PLATELETS: 204 10*3/uL (ref 150–440)
RBC: 4.47 MIL/uL (ref 3.80–5.20)
RDW: 14.1 % (ref 11.5–14.5)
WBC: 9 10*3/uL (ref 3.6–11.0)

## 2016-10-29 NOTE — ED Provider Notes (Signed)
[facility] Emergency Department Provider Note  Time seen: 4:10 PM  I have reviewed the triage vital signs and the nursing notes.   HISTORY  Chief Complaint Abdominal Pain and Fall    HPI Lisa House is a 25 y.o. female approximately [redacted] weeks pregnant who presents to the emergency department with right lower abdominal/right pelvis pain. According to the patient around 12 PM today she slipped and fell in her yard landed on her right side. She states since that time she has been experiencing right lower abdomen/right pelvic pain. As the patient is [redacted] weeks pregnant she was concerned and came to the emergency department for evaluation to ensure the health of her fetus. Patient denies any vaginal bleeding, or discharge. States the pain is mild to moderate, somewhat worse with movement.  Past Medical History:  Diagnosis Date  . Allergy   . Depression   . Eczema   . GERD (gastroesophageal reflux disease)   . Intertrigo   . Irritable bowel syndrome   . OCD (obsessive compulsive disorder)   . Panic attack   . Polycystic ovarian disease   . Skin lesion     Patient Active Problem List   Diagnosis Date Noted  . IBS (irritable bowel syndrome) 05/09/2015  . Extreme obesity (HCC) 05/09/2015  . OCD (obsessive compulsive disorder) 05/09/2015  . Bilateral polycystic ovarian syndrome 05/09/2015  . Psoriasis 05/09/2015  . History of depression 05/09/2015  . Allergic rhinitis 05/02/2010    Past Surgical History:  Procedure Laterality Date  . CHOLECYSTECTOMY  01/09/2011   Dr. Excell Seltzerooper    Prior to Admission medications   Medication Sig Start Date End Date Taking? Authorizing Provider  desonide (DESOWEN) 0.05 % cream Apply topically 2 (two) times daily. 01/21/16   Alba CoryKrichna Sowles, MD  fluticasone (FLONASE) 50 MCG/ACT nasal spray Place 2 sprays into both nostrils daily. 01/21/16   Alba CoryKrichna Sowles, MD  magic mouthwash w/lidocaine SOLN Take 15 mLs by mouth 3 (three)  times daily as needed for mouth pain. 01/21/16   Alba CoryKrichna Sowles, MD  metFORMIN (GLUCOPHAGE) 1000 MG tablet Take 1 tablet (1,000 mg total) by mouth 2 (two) times daily with a meal. 01/21/16   Alba CoryKrichna Sowles, MD  norethindrone-ethinyl estradiol (MICROGESTIN,JUNEL,LOESTRIN) 1-20 MG-MCG tablet Take 1 tablet by mouth daily.    Historical Provider, MD  Prenat-FeFum-FePo-FA-Omega 3 (CONCEPT DHA) 53.5-38-1 MG CAPS Take 1 tablet by mouth daily. 09/22/16   Myrna Blazeravid Matthew Schaevitz, MD    No Known Allergies  Family History  Problem Relation Age of Onset  . Hypertension Mother   . Diabetes Mother   . Headache Mother   . Hypertension Father   . Lung cancer Father   . Heart disease Son     Aortic Valve Disorder Stenosis-repaired    Social History Social History  Substance Use Topics  . Smoking status: Never Smoker  . Smokeless tobacco: Never Used  . Alcohol use No    Review of Systems Constitutional: Negative for fever. Cardiovascular: Negative for chest pain. Respiratory: Negative for shortness of breath. Gastrointestinal:Right lower quadrant/right pelvic pain. Genitourinary: Negative for dysuria. Negative for vaginal bleeding or discharge. Neurological: Negative for headache 10-point ROS otherwise negative.  ____________________________________________   PHYSICAL EXAM:  VITAL SIGNS: ED Triage Vitals  Enc Vitals Group     BP 10/29/16 1540 117/65     Pulse Rate 10/29/16 1540 92     Resp 10/29/16 1540 16     Temp 10/29/16 1540 98.6 F (37 C)  Temp Source 10/29/16 1540 Oral     SpO2 10/29/16 1540 99 %     Weight 10/29/16 1540 249 lb (112.9 kg)     Height 10/29/16 1540 5\' 6"  (1.676 m)     Head Circumference --      Peak Flow --      Pain Score 10/29/16 1545 3     Pain Loc --      Pain Edu? --      Excl. in GC? --     Constitutional: Alert and oriented. Well appearing and in no distress. Eyes: Normal exam ENT   Head: Normocephalic and atraumatic.   Mouth/Throat:  Mucous membranes are moist. Cardiovascular: Normal rate, regular rhythm. No murmur Respiratory: Normal respiratory effort without tachypnea nor retractions. Breath sounds are clear  Gastrointestinal: Soft, very slight suprapubic and right lower quadrant tenderness, no rebound or guarding. No distention. No bruising noted. Musculoskeletal: Nontender with normal range of motion in all extremities.  Neurologic:  Normal speech and language. No gross focal neurologic deficits Skin:  Skin is warm, dry and intact.  Psychiatric: Mood and affect are normal. Speech and behavior are normal.   ____________________________________________   RADIOLOGY  Single live intrauterine pregnancy 10 weeks and 3 days.  ____________________________________________   INITIAL IMPRESSION / ASSESSMENT AND PLAN / ED COURSE  Pertinent labs & imaging results that were available during my care of the patient were reviewed by me and considered in my medical decision making (see chart for details).  Patient presents the emergency department with right pelvic/lower quadrant abdominal pain after a fall experience approximately 4 hours ago. Patient is [redacted] weeks pregnant and her main concern is the health of her fetus. Given the patient's reported discomfort and fall we'll obtain an ultrasound to evaluate. Patient denies any vaginal bleeding or discharge. Overall the patient appears very well, no distress, no apparent discomfort currently.  Ultrasound shows a single live intrauterine pregnancy at 10 weeks, 3 days. No acute abnormality noted. Patient feeling better we will discharge with routine OB follow-up. Discussed return precautions for any vaginal bleeding or discharge, or return of/worsening pain.  ____________________________________________   FINAL CLINICAL IMPRESSION(S) / ED DIAGNOSES  Abdominal pain First trimester pregnancy   Minna AntisKevin Deasia Chiu, MD 10/29/16 1810

## 2016-10-29 NOTE — ED Notes (Signed)
Pt is [redacted] weeks pregnant, slipped and fel in yard, pt starting having RLQ pain after fall, no vaginal bleeding, pt rates pain intermittently a 3/10

## 2016-10-29 NOTE — ED Notes (Signed)
Pt unable to void at this time. 

## 2016-10-29 NOTE — Discharge Instructions (Signed)
Please follow-up your OB/GYN. Your workup today show normal results. Please return to the emergency department or call your OB/GYN for any worsening abdominal pain, any vaginal bleeding or discharge for further evaluation.

## 2016-10-29 NOTE — ED Triage Notes (Signed)
Pt slid in the yard this morning and fell.  Pt has low abd pain.  Pt reports she is [redacted] weeks pregnant.  No vag bleeding.  Pt alert

## 2016-11-10 ENCOUNTER — Emergency Department
Admission: EM | Admit: 2016-11-10 | Discharge: 2016-11-10 | Disposition: A | Discharge status: Home / Self Care | Payer: Medicaid Other | Attending: Emergency Medicine | Admitting: Emergency Medicine

## 2016-11-10 DIAGNOSIS — O4690 Antepartum hemorrhage, unspecified, unspecified trimester: Secondary | ICD-10-CM

## 2016-11-10 DIAGNOSIS — Z7984 Long term (current) use of oral hypoglycemic drugs: Secondary | ICD-10-CM | POA: Insufficient documentation

## 2016-11-10 DIAGNOSIS — R102 Pelvic and perineal pain: Secondary | ICD-10-CM | POA: Insufficient documentation

## 2016-11-10 DIAGNOSIS — Z3A13 13 weeks gestation of pregnancy: Secondary | ICD-10-CM | POA: Insufficient documentation

## 2016-11-10 DIAGNOSIS — O209 Hemorrhage in early pregnancy, unspecified: Secondary | ICD-10-CM | POA: Diagnosis not present

## 2016-11-10 DIAGNOSIS — Z79899 Other long term (current) drug therapy: Secondary | ICD-10-CM | POA: Diagnosis not present

## 2016-11-10 LAB — HCG, QUANTITATIVE, PREGNANCY: HCG, BETA CHAIN, QUANT, S: 87347 m[IU]/mL — AB (ref <5)

## 2016-11-10 LAB — POCT PREGNANCY, URINE: Preg Test, Ur: POSITIVE — AB

## 2016-11-10 NOTE — ED Triage Notes (Signed)
Pt reports [redacted] weeks pregnant and for the last two days she has been passing dark brown blood clots - no previous history of miscarriage - c/o left lower abd pain for a few days - this is 3rd pregnancy

## 2016-11-10 NOTE — L&D Delivery Note (Signed)
Delivery Note At 2:53 AM a viable female was delivered via Vaginal, Spontaneous Delivery (Presentation: ROA).  APGAR: 9, 9; weight pending.   Placenta status: spontaneous, intact.  Cord: 3VC without complications.  Cord pH: N/A  Anesthesia: Epidural Episiotomy: None Lacerations:  None Suture Repair: N/A Est. Blood Loss (mL):  200mL  Mom to postpartum.  Baby to Couplet care / Skin to Skin.  Lisa House 05/24/2017, 3:03 AM

## 2016-11-10 NOTE — ED Notes (Signed)
Patient states she fell last week, had ultrasound two weeks ago. States she is approx [redacted] weeks pregnant. When results reviewed by MD, patient's HCG is lower this week than last result. Patient reports passing dark blood. Had h/o hemorrhage that she was told resolved on last US. Patient reports mild pain to left lower pelvic area. Pelvic cart at bedside. No other needs at this time.

## 2016-11-10 NOTE — ED Provider Notes (Signed)
[facility] Emergency Department Provider Note  ____________________________________________   I have reviewed the triage vital signs and the nursing notes.   HISTORY  Chief Complaint Vaginal Bleeding    HPI Lisa House is a 26 y.o. female presents today complaining of mild spotting. Patient has had spotting during this pregnancy multiple times in the past. She states that she is not lightheaded no other symptoms. She states she had some slight cramping. Patient has had multiple different ultrasounds of the same pregnancy she's pregnant [redacted] weeks. She is G3P2. She has had different quadrants taken. She is known to be Rh+ from prior visits. Patient has had no recent trauma. She is concerned about the viability of her pregnancy overdose she had some dark spotting over the last day or so.       Past Medical History:  Diagnosis Date  . Allergy   . Depression   . Eczema   . GERD (gastroesophageal reflux disease)   . Intertrigo   . Irritable bowel syndrome   . OCD (obsessive compulsive disorder)   . Panic attack   . Polycystic ovarian disease   . Skin lesion     Patient Active Problem List   Diagnosis Date Noted  . IBS (irritable bowel syndrome) 05/09/2015  . Extreme obesity (HCC) 05/09/2015  . OCD (obsessive compulsive disorder) 05/09/2015  . Bilateral polycystic ovarian syndrome 05/09/2015  . Psoriasis 05/09/2015  . History of depression 05/09/2015  . Allergic rhinitis 05/02/2010    Past Surgical History:  Procedure Laterality Date  . CHOLECYSTECTOMY  01/09/2011   Dr. Excell Seltzerooper    Prior to Admission medications   Medication Sig Start Date End Date Taking? Authorizing Provider  desonide (DESOWEN) 0.05 % cream Apply topically 2 (two) times daily. 01/21/16   Alba CoryKrichna Sowles, MD  fluticasone (FLONASE) 50 MCG/ACT nasal spray Place 2 sprays into both nostrils daily. 01/21/16   Alba CoryKrichna Sowles, MD  magic mouthwash w/lidocaine SOLN Take 15 mLs by mouth  3 (three) times daily as needed for mouth pain. 01/21/16   Alba CoryKrichna Sowles, MD  metFORMIN (GLUCOPHAGE) 1000 MG tablet Take 1 tablet (1,000 mg total) by mouth 2 (two) times daily with a meal. 01/21/16   Alba CoryKrichna Sowles, MD  norethindrone-ethinyl estradiol (MICROGESTIN,JUNEL,LOESTRIN) 1-20 MG-MCG tablet Take 1 tablet by mouth daily.    Historical Provider, MD  Prenat-FeFum-FePo-FA-Omega 3 (CONCEPT DHA) 53.5-38-1 MG CAPS Take 1 tablet by mouth daily. 09/22/16   Myrna Blazeravid Matthew Schaevitz, MD    Allergies Patient has no known allergies.  Family History  Problem Relation Age of Onset  . Hypertension Mother   . Diabetes Mother   . Headache Mother   . Hypertension Father   . Lung cancer Father   . Heart disease Son     Aortic Valve Disorder Stenosis-repaired    Social History Social History  Substance Use Topics  . Smoking status: Never Smoker  . Smokeless tobacco: Never Used  . Alcohol use No    Review of Systems Constitutional: No fever/chills Eyes: No visual changes. ENT: No sore throat. No stiff neck no neck pain Cardiovascular: Denies chest pain. Respiratory: Denies shortness of breath. Gastrointestinal:   no vomiting.  No diarrhea.  No constipation. Genitourinary: Negative for dysuria. Musculoskeletal: Negative lower extremity swelling Skin: Negative for rash. Neurological: Negative for severe headaches, focal weakness or numbness. 10-point ROS otherwise negative.  ____________________________________________   PHYSICAL EXAM:  VITAL SIGNS: ED Triage Vitals  Enc Vitals Group     BP 11/10/16 1247  117/68     Pulse Rate 11/10/16 1247 84     Resp 11/10/16 1247 16     Temp 11/10/16 1247 97.5 F (36.4 C)     Temp Source 11/10/16 1247 Oral     SpO2 11/10/16 1247 99 %     Weight 11/10/16 1248 249 lb (112.9 kg)     Height 11/10/16 1248 5\' 6"  (1.676 m)     Head Circumference --      Peak Flow --      Pain Score 11/10/16 1248 1     Pain Loc --      Pain Edu? --      Excl.  in GC? --     Constitutional: Alert and oriented. Well appearing and in no acute distress. Eyes: Conjunctivae are normal. PERRL. EOMI. Head: Atraumatic. Nose: No congestion/rhinnorhea. Mouth/Throat: Mucous membranes are moist.  Oropharynx non-erythematous. Neck: No stridor.   Nontender with no meningismus Cardiovascular: Normal rate, regular rhythm. Grossly normal heart sounds.  Good peripheral circulation. Respiratory: Normal respiratory effort.  No retractions. Lungs CTAB. Abdominal: Soft and nontender. No distention. No guarding no rebound Back:  There is no focal tenderness or step off.  there is no midline tenderness there are no lesions noted. there is no CVA tenderness GU, patient declines Musculoskeletal: No lower extremity tenderness, no upper extremity tenderness. No joint effusions, no DVT signs strong distal pulses no edema Neurologic:  Normal speech and language. No gross focal neurologic deficits are appreciated.  Skin:  Skin is warm, dry and intact. No rash noted. Psychiatric: Mood and affect are normal. Speech and behavior are normal.  ____________________________________________   LABS (all labs ordered are listed, but only abnormal results are displayed)  Labs Reviewed  HCG, QUANTITATIVE, PREGNANCY - Abnormal; Notable for the following:       Result Value   hCG, Beta Chain, Quant, S [id number] (*)    All other components within normal limits  POCT PREGNANCY, URINE - Abnormal; Notable for the following:    Preg Test, Ur POSITIVE (*)    All other components within normal limits  POC URINE PREG, ED   ____________________________________________  EKG  I personally interpreted any EKGs ordered by me or triage  ____________________________________________  RADIOLOGY  I reviewed any imaging ordered by me or triage that were performed during my shift and, if possible, patient and/or family made aware of any abnormal  findings. ____________________________________________   PROCEDURES  Procedure(s) performed: None  Procedures  Critical Care performed: None  ____________________________________________   INITIAL IMPRESSION / ASSESSMENT AND PLAN / ED COURSE  Pertinent labs & imaging results that were available during my care of the patient were reviewed by me and considered in my medical decision making (see chart for details).  Ace with a known IUP muscle different ultrasounds is here for repeat ultrasound essentially because she would like to put her mind at ease about her baby because she had some more spotting. Her that even if she has a reassuring ultrasound at this time, there is no way that that will give her any further assurances in the future. She is nonviable. She is Rh+. She seemed amply stable. She has no symptoms at this time. She is playing with her cell phone. We did a bedside ultrasound which shows fetal heart tones at 150 with a active and intact IUP at this point. She understands that there is no guarantee that this means that she will remain in this date. I have talked to  Dr. Jean Rosenthal does not feel further workup is indicated. He and I discussed our findings and her complaint. Patient is very reassured by this. I've advised to closely follow up as an outpatient with her primary care OB. She is certainly willing to do so. I do not think a CBC is indicated there is no evidence of significant bleeding. Patient declines offered pelvic exam and she understands limitation this places upon in terms of evaluating the pregnancy, but with a known IUP, and good fetal heart tones and good fetal motion on that side ultrasound, I don't see any significant utility in pelvic exam unless there is concern for infection which at this time that does not appear to be. She has trace leuks in her urine but she is also asymptomatic, we will send a urine culture and patient will follow up closely as an outpatient.  Follow-up given and understood.  Clinical Course    ____________________________________________   FINAL CLINICAL IMPRESSION(S) / ED DIAGNOSES  Final diagnoses:  None      This chart was dictated using voice recognition software.  Despite best efforts to proofread,  errors can occur which can change meaning.      Jeanmarie Plant, MD 11/10/16 [phone]

## 2016-11-11 DIAGNOSIS — O99211 Obesity complicating pregnancy, first trimester: Secondary | ICD-10-CM | POA: Diagnosis not present

## 2016-11-11 DIAGNOSIS — Z6831 Body mass index (BMI) 31.0-31.9, adult: Secondary | ICD-10-CM | POA: Diagnosis not present

## 2016-11-11 DIAGNOSIS — Z3A12 12 weeks gestation of pregnancy: Secondary | ICD-10-CM | POA: Diagnosis not present

## 2016-11-11 DIAGNOSIS — O0991 Supervision of high risk pregnancy, unspecified, first trimester: Secondary | ICD-10-CM | POA: Diagnosis not present

## 2016-11-11 DIAGNOSIS — E669 Obesity, unspecified: Secondary | ICD-10-CM | POA: Diagnosis not present

## 2016-11-11 DIAGNOSIS — Z6841 Body Mass Index (BMI) 40.0 and over, adult: Secondary | ICD-10-CM | POA: Diagnosis not present

## 2016-11-11 DIAGNOSIS — Z362 Encounter for other antenatal screening follow-up: Secondary | ICD-10-CM | POA: Diagnosis not present

## 2016-11-11 DIAGNOSIS — Z87798 Personal history of other (corrected) congenital malformations: Secondary | ICD-10-CM | POA: Diagnosis not present

## 2016-11-11 DIAGNOSIS — Z3A10 10 weeks gestation of pregnancy: Secondary | ICD-10-CM | POA: Diagnosis not present

## 2016-11-12 ENCOUNTER — Other Ambulatory Visit: Payer: Self-pay | Admitting: *Deleted

## 2016-11-12 ENCOUNTER — Encounter: Payer: Self-pay | Admitting: *Deleted

## 2016-11-12 LAB — URINE CULTURE

## 2016-11-12 NOTE — Patient Outreach (Addendum)
[facility]) Care Management  11/12/2016  Lisa House 08-29-1991 [id number]   Subjective: Telephone call to patient's home / mobile number, spoke with patient, and HIPAA verified.   Discussed [facility] ED utilization follow up and patient in agreement to complete follow up. Patient states she not had anymore vaginal bleeding since discharge from ED.  States she has been using the ED for care related to pregnancy due to trouble getting appointment with GYN / OB MD.  [facility] discussed strategies to obtain follow up appointments, patient voices understanding and states she will notify RNCM if assistance needed in the future.   Patient states she does not have any transition of care, care coordination, disease management, disease monitoring, transportation, community resource, or pharmacy needs at this time.   States she is very appreciative of the follow up call and is in agreement to receive [facility] information.   Objective: Per chart review: Patient had ED visit on 1/1/8 for vaginal bleeding, on 10/29/16 status post fall, on  10/07/16 for vaginal bleeding, on 09/22/16 for vaginal bleeding, and on 08/02/16 for chest pain.    Assessment: [facility] referral on 11/11/16.   Patient has had 6 or more ED visits in the last 6 months.    ED follow up call completed, no care management needs, and will proceed with case closure.   Plan: RNCM will send patient successful outreach letter, [facility] pamphlet, and magnet. RNCM will send case closure due to follow up completed / no care management needs request to Iverson [facility]aminLaura Greeson at [facility].    Alphonsine Minium H. Gardiner Barefootooper RN, BSN, CCM [facility] Care Management [facility] Telephonic CM Phone: [phone] Fax: [phone]

## 2016-11-20 ENCOUNTER — Other Ambulatory Visit: Payer: Self-pay | Admitting: *Deleted

## 2016-11-20 NOTE — Patient Outreach (Signed)
[facility]) Care Management  11/20/2016  Lisa JoyKristin L Bache 1991-03-11 [id number]  Subjective: Telephone call to patient's home / mobile number, spoke with patient, and HIPAA verified.   Patient states she remembers speaking with this RNCM in the past, received [facility] Care [facility] information today, and does not have any questions.  RNCM advised patient of Baby Script program resources for [employer] Employees / Dependents, how to access, patient voices understanding, states she will enroll, and call RNCM if she has any questions.  States she is very appreciative of the follow up call.   Objective: Per chart review: Patient had ED visit on 1/1/8 for vaginal bleeding, on 10/29/16 status post fall, on  10/07/16 for vaginal bleeding, on 09/22/16 for vaginal bleeding, and on 08/02/16 for chest pain.    Assessment: [facility] on 11/11/16.  Patient has had 6 or more ED visits in the last 6 months.    ED follow up call completed on previous call, community resource information given verbally, no care management needs, and case will remain closed.   Plan: Case will remain closed and patient will call RNCM if any additional questions.     Clorine Swing H. Gardiner Barefootooper RN, BSN, CCM [facility] Care Management [facility] Telephonic CM Phone: [phone] Fax: [phone]

## 2016-11-25 DIAGNOSIS — O26892 Other specified pregnancy related conditions, second trimester: Secondary | ICD-10-CM | POA: Diagnosis not present

## 2016-11-25 DIAGNOSIS — R300 Dysuria: Secondary | ICD-10-CM | POA: Diagnosis not present

## 2016-11-25 DIAGNOSIS — Z3A14 14 weeks gestation of pregnancy: Secondary | ICD-10-CM | POA: Diagnosis not present

## 2016-11-27 ENCOUNTER — Encounter: Payer: Self-pay | Admitting: Emergency Medicine

## 2016-11-27 ENCOUNTER — Emergency Department
Admission: EM | Admit: 2016-11-27 | Discharge: 2016-11-27 | Disposition: A | Discharge status: Home / Self Care | Payer: Medicaid Other | Attending: Emergency Medicine | Admitting: Emergency Medicine

## 2016-11-27 DIAGNOSIS — Y999 Unspecified external cause status: Secondary | ICD-10-CM | POA: Insufficient documentation

## 2016-11-27 DIAGNOSIS — Y9301 Activity, walking, marching and hiking: Secondary | ICD-10-CM | POA: Diagnosis not present

## 2016-11-27 DIAGNOSIS — Z3A15 15 weeks gestation of pregnancy: Secondary | ICD-10-CM | POA: Diagnosis not present

## 2016-11-27 DIAGNOSIS — O9A212 Injury, poisoning and certain other consequences of external causes complicating pregnancy, second trimester: Secondary | ICD-10-CM | POA: Insufficient documentation

## 2016-11-27 DIAGNOSIS — Y929 Unspecified place or not applicable: Secondary | ICD-10-CM | POA: Insufficient documentation

## 2016-11-27 DIAGNOSIS — W19XXXA Unspecified fall, initial encounter: Secondary | ICD-10-CM

## 2016-11-27 DIAGNOSIS — S300XXA Contusion of lower back and pelvis, initial encounter: Secondary | ICD-10-CM | POA: Diagnosis not present

## 2016-11-27 DIAGNOSIS — W001XXA Fall from stairs and steps due to ice and snow, initial encounter: Secondary | ICD-10-CM | POA: Diagnosis not present

## 2016-11-27 DIAGNOSIS — Z7984 Long term (current) use of oral hypoglycemic drugs: Secondary | ICD-10-CM | POA: Insufficient documentation

## 2016-11-27 NOTE — ED Notes (Signed)
Pt fell on ice last night, back pain initally. Some lower abd pain on RT side. Pt aprox 15wks preg, denies any bleeding. Pt ambulatory.

## 2016-11-27 NOTE — ED Provider Notes (Signed)
[facility] Emergency Department Provider Note   ____________________________________________    I have reviewed the triage vital signs and the nursing notes.   HISTORY  Chief Complaint Fall     HPI Lisa House is a 26 y.o. female who reports she is [redacted] weeks pregnant who presents after a fall. Patient reports she was walking down steps and slipped on the bottom step which was icy. She fell onto her buttocks. Initially she was feeling only sore she spoke to her doctor who told her if she had any abdominal discomfort to come to the ED so she decided to come get checked out. She has no current abdominal pain. She denies vaginal bleeding.   Past Medical History:  Diagnosis Date  . Allergy   . Depression   . Eczema   . GERD (gastroesophageal reflux disease)   . Intertrigo   . Irritable bowel syndrome   . OCD (obsessive compulsive disorder)   . Panic attack   . Polycystic ovarian disease   . Skin lesion     Patient Active Problem List   Diagnosis Date Noted  . IBS (irritable bowel syndrome) 05/09/2015  . Extreme obesity (HCC) 05/09/2015  . OCD (obsessive compulsive disorder) 05/09/2015  . Bilateral polycystic ovarian syndrome 05/09/2015  . Psoriasis 05/09/2015  . History of depression 05/09/2015  . Allergic rhinitis 05/02/2010    Past Surgical History:  Procedure Laterality Date  . CHOLECYSTECTOMY  01/09/2011   Dr. Excell Seltzer    Prior to Admission medications   Medication Sig Start Date End Date Taking? Authorizing Provider  desonide (DESOWEN) 0.05 % cream Apply topically 2 (two) times daily. 01/21/16   [name], MD  fluticasone (FLONASE) 50 MCG/ACT nasal spray Place 2 sprays into both nostrils daily. 01/21/16   [name], MD  magic mouthwash w/lidocaine SOLN Take 15 mLs by mouth 3 (three) times daily as needed for mouth pain. 01/21/16   [name], MD  metFORMIN (GLUCOPHAGE) 1000 MG tablet Take 1 tablet (1,000 mg total) by  mouth 2 (two) times daily with a meal. 01/21/16   [name], MD  norethindrone-ethinyl estradiol (MICROGESTIN,JUNEL,LOESTRIN) 1-20 MG-MCG tablet Take 1 tablet by mouth daily.    Historical Provider, MD  Prenat-FeFum-FePo-FA-Omega 3 (CONCEPT DHA) 53.5-38-1 MG CAPS Take 1 tablet by mouth daily. 09/22/16   Myrna Blazer, MD     Allergies Patient has no known allergies.  Family History  Problem Relation Age of Onset  . Hypertension Mother   . Diabetes Mother   . Headache Mother   . Hypertension Father   . Lung cancer Father   . Heart disease Son     Aortic Valve Disorder Stenosis-repaired    Social History Social History  Substance Use Topics  . Smoking status: Never Smoker  . Smokeless tobacco: Never Used  . Alcohol use No    Review of Systems  Constitutional: No fever/chills Eyes: No visual changes.  ENT: NoNeck pain Cardiovascular: Denies chest pain. Respiratory: Denies shortness of breath. Gastrointestinal: No nausea, no vomiting.   Genitourinary: No vaginal bleeding Musculoskeletal: Lower back pain Skin: Negative for abrasion or laceration Neurological: Negative for headaches or weakness  10-point ROS otherwise negative.  ____________________________________________   PHYSICAL EXAM:  VITAL SIGNS: ED Triage Vitals  Enc Vitals Group     BP 11/27/16 1348 114/63     Pulse Rate 11/27/16 1348 78     Resp 11/27/16 1348 (!) 2     Temp 11/27/16 1348 98.1  F (36.7 C)     Temp Source 11/27/16 1348 Oral     SpO2 11/27/16 1348 100 %     Weight 11/27/16 1349 246 lb (111.6 kg)     Height 11/27/16 1349 5\' 6"  (1.676 m)     Head Circumference --      Peak Flow --      Pain Score 11/27/16 1349 4     Pain Loc --      Pain Edu? --      Excl. in GC? --     Constitutional: Alert and oriented. No acute distress. Pleasant and interactive Eyes: Conjunctivae are normal.  Head: Atraumatic.  Neck:  Painless ROM Cardiovascular: Normal rate, regular rhythm.    Good peripheral circulation. Respiratory: Normal respiratory effort.  No retractions.  Gastrointestinal: Soft and nontender. No distention.  No CVA tenderness. Genitourinary: deferred Musculoskeletal: No vertebral tenderness palpation, full range of motion.  Warm and well perfused extremities Neurologic:  Normal speech and language. No gross focal neurologic deficits are appreciated.  Skin:  Skin is warm, dry and intact. No rash noted. Psychiatric: Mood and affect are normal. Speech and behavior are normal.  ____________________________________________   LABS (all labs ordered are listed, but only abnormal results are displayed)  Labs Reviewed - No data to display ____________________________________________  EKG  None ____________________________________________  RADIOLOGY  None ____________________________________________   PROCEDURES  Procedure(s) performed: No    Critical Care performed: No ____________________________________________   INITIAL IMPRESSION / ASSESSMENT AND PLAN / ED COURSE  Pertinent labs & imaging results that were available during my care of the patient were reviewed by me and considered in my medical decision making (see chart for details).  EMBU performed with fetal movement noted, HR 153. Patient's exam is benign, no abd ttp, no vertebral ttp. Suspect contusion, recommend supportive care with tylenol    ____________________________________________   FINAL CLINICAL IMPRESSION(S) / ED DIAGNOSES  Final diagnoses:  Fall, initial encounter  Contusion of lower back, initial encounter      NEW MEDICATIONS STARTED DURING THIS VISIT:  Discharge Medication List as of 11/27/2016  3:42 PM       Note:  This document was prepared using Dragon voice recognition software and may include unintentional dictation errors.    Jene Everyobert Sejal Cofield, MD 11/27/16 [phone]

## 2016-11-27 NOTE — ED Notes (Addendum)
Unable to auscultate FHT. [facility] Pt informed this does not mean there is no heart beat but can be difficult to assess at this age. MD will evaluate in back.

## 2016-11-27 NOTE — ED Triage Notes (Signed)
Pt was walking down stairs and slipped on a stair and landed on butt. Is [redacted] weeks pregnant and started having RLQ pain this morning. No distress currently. Denies vaginal bleeding. obgyn told her come to ED to get checked out. Has not really felt baby yet during pregnancy so unsure if changes.  Did not land on abdomen with fall.

## 2017-01-29 ENCOUNTER — Other Ambulatory Visit: Payer: Self-pay | Admitting: Certified Nurse Midwife

## 2017-01-29 DIAGNOSIS — Z3492 Encounter for supervision of normal pregnancy, unspecified, second trimester: Secondary | ICD-10-CM

## 2017-01-30 ENCOUNTER — Ambulatory Visit (INDEPENDENT_AMBULATORY_CARE_PROVIDER_SITE_OTHER): Payer: Medicaid Other

## 2017-01-30 ENCOUNTER — Ambulatory Visit (INDEPENDENT_AMBULATORY_CARE_PROVIDER_SITE_OTHER): Payer: Medicaid Other | Admitting: Certified Nurse Midwife

## 2017-01-30 ENCOUNTER — Telehealth: Payer: Self-pay

## 2017-01-30 ENCOUNTER — Encounter: Payer: Self-pay | Admitting: Certified Nurse Midwife

## 2017-01-30 VITALS — BP 124/60 | Wt 261.0 lb

## 2017-01-30 DIAGNOSIS — IMO0001 Reserved for inherently not codable concepts without codable children: Secondary | ICD-10-CM

## 2017-01-30 DIAGNOSIS — A6000 Herpesviral infection of urogenital system, unspecified: Secondary | ICD-10-CM

## 2017-01-30 DIAGNOSIS — R3915 Urgency of urination: Secondary | ICD-10-CM

## 2017-01-30 DIAGNOSIS — Z3A24 24 weeks gestation of pregnancy: Secondary | ICD-10-CM

## 2017-01-30 DIAGNOSIS — Z362 Encounter for other antenatal screening follow-up: Secondary | ICD-10-CM

## 2017-01-30 DIAGNOSIS — O0990 Supervision of high risk pregnancy, unspecified, unspecified trimester: Secondary | ICD-10-CM

## 2017-01-30 DIAGNOSIS — O99210 Obesity complicating pregnancy, unspecified trimester: Secondary | ICD-10-CM

## 2017-01-30 DIAGNOSIS — O26892 Other specified pregnancy related conditions, second trimester: Secondary | ICD-10-CM

## 2017-01-30 DIAGNOSIS — Z3492 Encounter for supervision of normal pregnancy, unspecified, second trimester: Secondary | ICD-10-CM

## 2017-01-30 DIAGNOSIS — R300 Dysuria: Secondary | ICD-10-CM

## 2017-01-30 DIAGNOSIS — O0972 Supervision of high risk pregnancy due to social problems, second trimester: Secondary | ICD-10-CM

## 2017-01-30 LAB — POCT URINALYSIS DIPSTICK
BILIRUBIN UA: NEGATIVE
Glucose, UA: NEGATIVE
Ketones, UA: NEGATIVE
NITRITE UA: NEGATIVE
Protein, UA: NEGATIVE
Spec Grav, UA: 1.01 (ref 1.030–1.035)
UROBILINOGEN UA: NEGATIVE (ref ?–2.0)
pH, UA: 6.5 (ref 5.0–8.0)

## 2017-01-30 MED ORDER — CEPHALEXIN 500 MG PO CAPS: 500.0000 mg | ORAL_CAPSULE | Freq: Three times a day (TID) | ORAL | 0 refills | Status: AC

## 2017-01-30 NOTE — Telephone Encounter (Signed)
Pt called stating she was senn this am and forgot to get an antibx for UTI from [facility].. She needs it sent to [facility]. [phone]

## 2017-01-30 NOTE — Telephone Encounter (Signed)
Please advise 

## 2017-01-30 NOTE — Progress Notes (Signed)
EDC changed to reflect earliest CRL at [facility] on 11/28. LMP was a guess.   Results for orders placed or performed in visit on 01/30/17  Urine Culture  Result Value Ref Range   Urine Culture, Routine Final report    Urine Culture result 1 Comment   POCT urinalysis dipstick  Result Value Ref Range   Color, UA Yellow    Clarity, UA Clear    Glucose, UA Negative    Bilirubin, UA Negative    Ketones, UA Negative    Spec Grav, UA 1.010 1.030 - 1.035   Blood, UA Small    pH, UA 6.5 5.0 - 8.0   Protein, UA Negative    Urobilinogen, UA negative Negative - 2.0   Nitrite, UA Negative    Leukocytes, UA large (3+) (A) Negative   HPI:      [name] is a 26 y.o. G3P2, pregnant [redacted] weeks; presents today for a problem visit.    Urinary Tract Infection: Patient complains of burning with urination . She has had symptoms for 2 days. Patient also complains of back pain. Patient denies fever. Patient does not have a history of recurrent UTI.  Patient does not have a history of pyelonephritis.   PMHx: She  has a past medical history of Allergy; Depression; Eczema; GERD (gastroesophageal reflux disease); Intertrigo; Irritable bowel syndrome; OCD (obsessive compulsive disorder); Panic attack; Polycystic ovarian disease; and Skin lesion. Also,  has a past surgical history that includes Cholecystectomy (01/09/2011)., family history includes Diabetes in her mother; Headache in her mother; Heart disease in her son; Hypertension in her father and mother; Lung cancer in her father.,  reports that she has never smoked. She has never used smokeless tobacco. She reports that she does not drink alcohol or use drugs.  She has a current medication list which includes the following prescription(s): cephalexin, desonide, fluticasone, metformin, and concept dha. Also, has No Known Allergies.  Review of Systems  Constitutional: Negative for chills, fever and malaise/fatigue.  HENT: Negative for congestion,  sinus pain and sore throat.   Eyes: Negative for blurred vision and pain.  Respiratory: Negative for cough and wheezing.   Cardiovascular: Negative for chest pain and leg swelling.  Gastrointestinal: Negative for abdominal pain, constipation, diarrhea, heartburn, nausea and vomiting.  Genitourinary: Negative for dysuria, frequency, hematuria and urgency.  Musculoskeletal: Negative for back pain, joint pain, myalgias and neck pain.  Skin: Negative for itching and rash.  Neurological: Negative for dizziness, tremors and weakness.  Endo/Heme/Allergies: Does not bruise/bleed easily.  Psychiatric/Behavioral: Negative for depression. The patient is not nervous/anxious and does not have insomnia.    Objective: BP 124/60   Wt 261 lb (118.4 kg)   LMP 08/21/2016   BMI 42.13 kg/m  Physical Exam  Constitutional: She is oriented to person, place, and time. She appears well-developed and well-nourished. No distress.  Musculoskeletal: Normal range of motion.  Neurological: She is alert and oriented to person, place, and time.  Skin: Skin is warm and dry.  Psychiatric: She has a normal mood and affect.  Vitals reviewed. No CVAT  ASSESSMENT/PLAN:   Acute cystitis  Problem List Items Addressed This Visit    None    Visit Diagnoses    Urinary urgency    -  Primary   Relevant Orders   POCT urinalysis dipstick (Completed)   Urine Culture (Completed)   Dysuria during pregnancy in second trimester       Relevant Orders   Urine Culture (  Completed)   [redacted] weeks gestation of pregnancy       Relevant Orders   28 Week RH+Panel    Keflex

## 2017-01-30 NOTE — Telephone Encounter (Signed)
Called patient-did not discuss her frequency and voiding small amts x1 week during her appointment with me. Urine dipstick showed leukocytes. Urine culture sent. Will start on Keflex while awaiting culture results. RX called to [facility].

## 2017-01-30 NOTE — Progress Notes (Signed)
Urinary urgency/burning (see UDIP)

## 2017-02-01 LAB — URINE CULTURE

## 2017-02-08 ENCOUNTER — Encounter: Payer: Self-pay | Admitting: Certified Nurse Midwife

## 2017-02-08 DIAGNOSIS — A6000 Herpesviral infection of urogenital system, unspecified: Secondary | ICD-10-CM | POA: Insufficient documentation

## 2017-02-08 NOTE — Addendum Note (Signed)
Addended by: Farrel Conners on: 02/08/2017 04:11 PM   Modules accepted: Orders, Level of Service

## 2017-02-08 NOTE — Progress Notes (Signed)
Persistent right choroid plexus cyst with a negative first trimester test. Explained to patient probable normal variant. Will schedule growth scan (BMI>40) at next visit in 4 weeks. Needs 28 week labs at that time.

## 2017-02-08 NOTE — Progress Notes (Signed)
Addendum to late note for 3/23. Patient complained of dysuria and urinary urgency. U dip with large leukocytes. Urine culture sent. RX for Keflex 500 mgm tid x 7 days while awaiting culture.

## 2017-02-26 ENCOUNTER — Ambulatory Visit (INDEPENDENT_AMBULATORY_CARE_PROVIDER_SITE_OTHER): Payer: Medicaid Other

## 2017-02-26 DIAGNOSIS — O99210 Obesity complicating pregnancy, unspecified trimester: Secondary | ICD-10-CM

## 2017-02-26 DIAGNOSIS — O0972 Supervision of high risk pregnancy due to social problems, second trimester: Secondary | ICD-10-CM

## 2017-02-26 DIAGNOSIS — O350XX0 Maternal care for (suspected) central nervous system malformation in fetus, not applicable or unspecified: Secondary | ICD-10-CM

## 2017-02-26 DIAGNOSIS — IMO0001 Reserved for inherently not codable concepts without codable children: Secondary | ICD-10-CM

## 2017-02-27 ENCOUNTER — Ambulatory Visit (INDEPENDENT_AMBULATORY_CARE_PROVIDER_SITE_OTHER): Payer: Medicaid Other | Admitting: Obstetrics and Gynecology

## 2017-02-27 ENCOUNTER — Other Ambulatory Visit: Payer: Medicaid Other

## 2017-02-27 VITALS — BP 126/72 | Wt 259.0 lb

## 2017-02-27 DIAGNOSIS — O99210 Obesity complicating pregnancy, unspecified trimester: Secondary | ICD-10-CM

## 2017-02-27 DIAGNOSIS — Z3A24 24 weeks gestation of pregnancy: Secondary | ICD-10-CM

## 2017-02-27 DIAGNOSIS — O0990 Supervision of high risk pregnancy, unspecified, unspecified trimester: Secondary | ICD-10-CM

## 2017-02-27 DIAGNOSIS — Z6841 Body Mass Index (BMI) 40.0 and over, adult: Secondary | ICD-10-CM | POA: Insufficient documentation

## 2017-02-27 DIAGNOSIS — Z3A27 27 weeks gestation of pregnancy: Secondary | ICD-10-CM

## 2017-02-27 NOTE — Progress Notes (Signed)
28 week labs today. No vb. No lof. grwoth today 61st %ile, afi 12cm. Heartburn: recommend zantac

## 2017-02-28 LAB — 28 WEEK RH+PANEL
BASOS ABS: 0 10*3/uL (ref 0.0–0.2)
BASOS: 0 %
EOS (ABSOLUTE): 0.1 10*3/uL (ref 0.0–0.4)
Eos: 1 %
GESTATIONAL DIABETES SCREEN: 91 mg/dL (ref 65–139)
HIV SCREEN 4TH GENERATION: NONREACTIVE
Hematocrit: 29 % — ABNORMAL LOW (ref 34.0–46.6)
Hemoglobin: 9.7 g/dL — ABNORMAL LOW (ref 11.1–15.9)
IMMATURE GRANS (ABS): 0 10*3/uL (ref 0.0–0.1)
IMMATURE GRANULOCYTES: 0 %
LYMPHS: 25 %
Lymphocytes Absolute: 1.8 10*3/uL (ref 0.7–3.1)
MCH: 25.4 pg — ABNORMAL LOW (ref 26.6–33.0)
MCHC: 33.4 g/dL (ref 31.5–35.7)
MCV: 76 fL — AB (ref 79–97)
MONOCYTES: 5 %
Monocytes Absolute: 0.3 10*3/uL (ref 0.1–0.9)
NEUTROS PCT: 69 %
Neutrophils Absolute: 5 10*3/uL (ref 1.4–7.0)
Platelets: 175 10*3/uL (ref 150–379)
RBC: 3.82 x10E6/uL (ref 3.77–5.28)
RDW: 15.1 % (ref 12.3–15.4)
RPR: NONREACTIVE
WBC: 7.2 10*3/uL (ref 3.4–10.8)

## 2017-03-13 ENCOUNTER — Ambulatory Visit (INDEPENDENT_AMBULATORY_CARE_PROVIDER_SITE_OTHER): Payer: Medicaid Other | Admitting: Certified Nurse Midwife

## 2017-03-13 VITALS — BP 104/64 | Wt 252.0 lb

## 2017-03-13 DIAGNOSIS — O99210 Obesity complicating pregnancy, unspecified trimester: Secondary | ICD-10-CM

## 2017-03-13 DIAGNOSIS — Z3A29 29 weeks gestation of pregnancy: Secondary | ICD-10-CM

## 2017-03-13 DIAGNOSIS — O0990 Supervision of high risk pregnancy, unspecified, unspecified trimester: Secondary | ICD-10-CM

## 2017-03-13 NOTE — Progress Notes (Signed)
Pt reports feeling a little sick and decreased appetite.

## 2017-03-16 NOTE — Progress Notes (Signed)
Feeling more movement when she lies down at night. FHTs 106 with DT initially with patient supine. NST done: baseline 125-130 with accelerations to 160s to 180, moderate variability in semi-Fowlers. 28 week labs: 1 hr GTT 91 with H&H 9.7gm/dl and 81%29% Hct.  Given RX for Citranatal Bloom-take one daily for extra iron. Breast/ minipill ROB and growth scan in 2 weeks

## 2017-03-27 ENCOUNTER — Ambulatory Visit (INDEPENDENT_AMBULATORY_CARE_PROVIDER_SITE_OTHER): Payer: Medicaid Other

## 2017-03-27 DIAGNOSIS — O0990 Supervision of high risk pregnancy, unspecified, unspecified trimester: Secondary | ICD-10-CM

## 2017-03-27 DIAGNOSIS — O99210 Obesity complicating pregnancy, unspecified trimester: Secondary | ICD-10-CM

## 2017-03-31 ENCOUNTER — Other Ambulatory Visit: Payer: Medicaid Other

## 2017-03-31 ENCOUNTER — Ambulatory Visit (INDEPENDENT_AMBULATORY_CARE_PROVIDER_SITE_OTHER): Payer: Medicaid Other | Admitting: Obstetrics & Gynecology

## 2017-03-31 VITALS — BP 120/70 | Wt 253.0 lb

## 2017-03-31 DIAGNOSIS — Z23 Encounter for immunization: Secondary | ICD-10-CM | POA: Diagnosis not present

## 2017-03-31 DIAGNOSIS — Z6841 Body Mass Index (BMI) 40.0 and over, adult: Secondary | ICD-10-CM

## 2017-03-31 DIAGNOSIS — Z3A32 32 weeks gestation of pregnancy: Secondary | ICD-10-CM

## 2017-03-31 DIAGNOSIS — O0990 Supervision of high risk pregnancy, unspecified, unspecified trimester: Secondary | ICD-10-CM

## 2017-03-31 DIAGNOSIS — Z8659 Personal history of other mental and behavioral disorders: Secondary | ICD-10-CM

## 2017-03-31 NOTE — Progress Notes (Signed)
PNV, FMC, PTL precautions US discussed (growth 63%, AFI 12)

## 2017-03-31 NOTE — Patient Instructions (Signed)
Third Trimester of Pregnancy The third trimester is from week 28 through week 40 (months 7 through 9). The third trimester is a time when the unborn baby (fetus) is growing rapidly. At the end of the ninth month, the fetus is about 20 inches in length and weighs 6-10 pounds. Body changes during your third trimester Your body will continue to go through many changes during pregnancy. The changes vary from woman to woman. During the third trimester:  Your weight will continue to increase. You can expect to gain 25-35 pounds (11-16 kg) by the end of the pregnancy.  You may begin to get stretch marks on your hips, abdomen, and breasts.  You may urinate more often because the fetus is moving lower into your pelvis and pressing on your bladder.  You may develop or continue to have heartburn. This is caused by increased hormones that slow down muscles in the digestive tract.  You may develop or continue to have constipation because increased hormones slow digestion and cause the muscles that push waste through your intestines to relax.  You may develop hemorrhoids. These are swollen veins (varicose veins) in the rectum that can itch or be painful.  You may develop swollen, bulging veins (varicose veins) in your legs.  You may have increased body aches in the pelvis, back, or thighs. This is due to weight gain and increased hormones that are relaxing your joints.  You may have changes in your hair. These can include thickening of your hair, rapid growth, and changes in texture. Some women also have hair loss during or after pregnancy, or hair that feels dry or thin. Your hair will most likely return to normal after your baby is born.  Your breasts will continue to grow and they will continue to become tender. A yellow fluid (colostrum) may leak from your breasts. This is the first milk you are producing for your baby.  Your belly button may stick out.  You may notice more swelling in your hands,  face, or ankles.  You may have increased tingling or numbness in your hands, arms, and legs. The skin on your belly may also feel numb.  You may feel short of breath because of your expanding uterus.  You may have more problems sleeping. This can be caused by the size of your belly, increased need to urinate, and an increase in your body's metabolism.  You may notice the fetus "dropping," or moving lower in your abdomen (lightening).  You may have increased vaginal discharge.  You may notice your joints feel loose and you may have pain around your pelvic bone.  What to expect at prenatal visits You will have prenatal exams every 2 weeks until week 36. Then you will have weekly prenatal exams. During a routine prenatal visit:  You will be weighed to make sure you and the baby are growing normally.  Your blood pressure will be taken.  Your abdomen will be measured to track your baby's growth.  The fetal heartbeat will be listened to.  Any test results from the previous visit will be discussed.  You may have a cervical check near your due date to see if your cervix has softened or thinned (effaced).  You will be tested for Group B streptococcus. This happens between 35 and 37 weeks.  Your health care provider may ask you:  What your birth plan is.  How you are feeling.  If you are feeling the baby move.  If you have had   any abnormal symptoms, such as leaking fluid, bleeding, severe headaches, or abdominal cramping.  If you are using any tobacco products, including cigarettes, chewing tobacco, and electronic cigarettes.  If you have any questions.  Other tests or screenings that may be performed during your third trimester include:  Blood tests that check for low iron levels (anemia).  Fetal testing to check the health, activity level, and growth of the fetus. Testing is done if you have certain medical conditions or if there are problems during the  pregnancy.  Nonstress test (NST). This test checks the health of your baby to make sure there are no signs of problems, such as the baby not getting enough oxygen. During this test, a belt is placed around your belly. The baby is made to move, and its heart rate is monitored during movement.  What is false labor? False labor is a condition in which you feel small, irregular tightenings of the muscles in the womb (contractions) that usually go away with rest, changing position, or drinking water. These are called Braxton Hicks contractions. Contractions may last for hours, days, or even weeks before true labor sets in. If contractions come at regular intervals, become more frequent, increase in intensity, or become painful, you should see your health care provider. What are the signs of labor?  Abdominal cramps.  Regular contractions that start at 10 minutes apart and become stronger and more frequent with time.  Contractions that start on the top of the uterus and spread down to the lower abdomen and back.  Increased pelvic pressure and dull back pain.  A watery or bloody mucus discharge that comes from the vagina.  Leaking of amniotic fluid. This is also known as your "water breaking." It could be a slow trickle or a gush. Let your health care provider know if it has a color or strange odor. If you have any of these signs, call your health care provider right away, even if it is before your due date. Follow these instructions at home: Medicines  Follow your health care provider's instructions regarding medicine use. Specific medicines may be either safe or unsafe to take during pregnancy.  Take a prenatal vitamin that contains at least 600 micrograms (mcg) of folic acid.  If you develop constipation, try taking a stool softener if your health care provider approves. Eating and drinking  Eat a balanced diet that includes fresh fruits and vegetables, whole grains, good sources of protein  such as meat, eggs, or tofu, and low-fat dairy. Your health care provider will help you determine the amount of weight gain that is right for you.  Avoid raw meat and uncooked cheese. These carry germs that can cause birth defects in the baby.  If you have low calcium intake from food, talk to your health care provider about whether you should take a daily calcium supplement.  Eat four or five small meals rather than three large meals a day.  Limit foods that are high in fat and processed sugars, such as fried and sweet foods.  To prevent constipation: ? Drink enough fluid to keep your urine clear or pale yellow. ? Eat foods that are high in fiber, such as fresh fruits and vegetables, whole grains, and beans. Activity  Exercise only as directed by your health care provider. Most women can continue their usual exercise routine during pregnancy. Try to exercise for 30 minutes at least 5 days a week. Stop exercising if you experience uterine contractions.  Avoid heavy   lifting.  Do not exercise in extreme heat or humidity, or at high altitudes.  Wear low-heel, comfortable shoes.  Practice good posture.  You may continue to have sex unless your health care provider tells you otherwise. Relieving pain and discomfort  Take frequent breaks and rest with your legs elevated if you have leg cramps or low back pain.  Take warm sitz baths to soothe any pain or discomfort caused by hemorrhoids. Use hemorrhoid cream if your health care provider approves.  Wear a good support bra to prevent discomfort from breast tenderness.  If you develop varicose veins: ? Wear support pantyhose or compression stockings as told by your healthcare provider. ? Elevate your feet for 15 minutes, 3-4 times a day. Prenatal care  Write down your questions. Take them to your prenatal visits.  Keep all your prenatal visits as told by your health care provider. This is important. Safety  Wear your seat belt at  all times when driving.  Make a list of emergency phone numbers, including numbers for family, friends, the hospital, and police and fire departments. General instructions  Avoid cat litter boxes and soil used by cats. These carry germs that can cause birth defects in the baby. If you have a cat, ask someone to clean the litter box for you.  Do not travel far distances unless it is absolutely necessary and only with the approval of your health care provider.  Do not use hot tubs, steam rooms, or saunas.  Do not drink alcohol.  Do not use any products that contain nicotine or tobacco, such as cigarettes and e-cigarettes. If you need help quitting, ask your health care provider.  Do not use any medicinal herbs or unprescribed drugs. These chemicals affect the formation and growth of the baby.  Do not douche or use tampons or scented sanitary pads.  Do not cross your legs for long periods of time.  To prepare for the arrival of your baby: ? Take prenatal classes to understand, practice, and ask questions about labor and delivery. ? Make a trial run to the hospital. ? Visit the hospital and tour the maternity area. ? Arrange for maternity or paternity leave through employers. ? Arrange for family and friends to take care of pets while you are in the hospital. ? Purchase a rear-facing car seat and make sure you know how to install it in your car. ? Pack your hospital bag. ? Prepare the baby's nursery. Make sure to remove all pillows and stuffed animals from the baby's crib to prevent suffocation.  Visit your dentist if you have not gone during your pregnancy. Use a soft toothbrush to brush your teeth and be gentle when you floss. Contact a health care provider if:  You are unsure if you are in labor or if your water has broken.  You become dizzy.  You have mild pelvic cramps, pelvic pressure, or nagging pain in your abdominal area.  You have lower back pain.  You have persistent  nausea, vomiting, or diarrhea.  You have an unusual or bad smelling vaginal discharge.  You have pain when you urinate. Get help right away if:  Your water breaks before 37 weeks.  You have regular contractions less than 5 minutes apart before 37 weeks.  You have a fever.  You are leaking fluid from your vagina.  You have spotting or bleeding from your vagina.  You have severe abdominal pain or cramping.  You have rapid weight loss or weight gain.    You have shortness of breath with chest pain.  You notice sudden or extreme swelling of your face, hands, ankles, feet, or legs.  Your baby makes fewer than 10 movements in 2 hours.  You have severe headaches that do not go away when you take medicine.  You have vision changes. Summary  The third trimester is from week 28 through week 40, months 7 through 9. The third trimester is a time when the unborn baby (fetus) is growing rapidly.  During the third trimester, your discomfort may increase as you and your baby continue to gain weight. You may have abdominal, leg, and back pain, sleeping problems, and an increased need to urinate.  During the third trimester your breasts will keep growing and they will continue to become tender. A yellow fluid (colostrum) may leak from your breasts. This is the first milk you are producing for your baby.  False labor is a condition in which you feel small, irregular tightenings of the muscles in the womb (contractions) that eventually go away. These are called Braxton Hicks contractions. Contractions may last for hours, days, or even weeks before true labor sets in.  Signs of labor can include: abdominal cramps; regular contractions that start at 10 minutes apart and become stronger and more frequent with time; watery or bloody mucus discharge that comes from the vagina; increased pelvic pressure and dull back pain; and leaking of amniotic fluid. This information is not intended to replace advice  given to you by your health care provider. Make sure you discuss any questions you have with your health care provider. Document Released: 10/21/2001 Document Revised: 04/03/2016 Document Reviewed: 12/28/2012 Elsevier Interactive Patient Education  2017 [employer]  

## 2017-04-02 ENCOUNTER — Observation Stay
Admission: EM | Admit: 2017-04-02 | Discharge: 2017-04-03 | Disposition: A | Discharge status: Home / Self Care | Payer: Medicaid Other | Attending: Obstetrics and Gynecology | Admitting: Obstetrics and Gynecology

## 2017-04-02 ENCOUNTER — Encounter: Payer: Self-pay | Admitting: *Deleted

## 2017-04-02 DIAGNOSIS — O368190 Decreased fetal movements, unspecified trimester, not applicable or unspecified: Principal | ICD-10-CM | POA: Insufficient documentation

## 2017-04-02 DIAGNOSIS — Z3A00 Weeks of gestation of pregnancy not specified: Secondary | ICD-10-CM | POA: Insufficient documentation

## 2017-04-02 NOTE — OB Triage Note (Signed)
Recvd pt from ED. PT states she has felt very little fetal movement over the past 5 hours and none for the past hour and a half. Pt states she feels baby kicking now like usual. Pt has no other complaints. No vaginal bleeding or leaking of fluid.

## 2017-04-03 DIAGNOSIS — Z3A00 Weeks of gestation of pregnancy not specified: Secondary | ICD-10-CM | POA: Diagnosis not present

## 2017-04-03 DIAGNOSIS — Z3A32 32 weeks gestation of pregnancy: Secondary | ICD-10-CM | POA: Diagnosis not present

## 2017-04-03 DIAGNOSIS — O368190 Decreased fetal movements, unspecified trimester, not applicable or unspecified: Secondary | ICD-10-CM | POA: Diagnosis not present

## 2017-04-03 DIAGNOSIS — O368130 Decreased fetal movements, third trimester, not applicable or unspecified: Secondary | ICD-10-CM | POA: Diagnosis not present

## 2017-04-03 MED ORDER — ACETAMINOPHEN 325 MG PO TABS: 650.0000 mg | ORAL_TABLET | ORAL | Status: DC | PRN

## 2017-04-03 NOTE — Discharge Summary (Signed)
See final progress note. 

## 2017-04-03 NOTE — Final Progress Note (Signed)
Physician Final Progress Note  Patient ID: Lisa JoyKristin L Palla MRN: [id number] DOB/AGE: [birth year] 26 y.o.  Admit date: 04/02/2017 Admitting provider: Vena AustriaAndreas Hardin Hardenbrook, MD Discharge date: 04/03/2017   Admission Diagnoses: Decreased fetal movement  Discharge Diagnoses:  Active Problems:   Decreased fetal movement    Consults: None  Significant Findings/ Diagnostic Studies: none  Procedures: NST Baseline: 125 Variability: moderate Accelerations: present Decelerations: absent Tocometry: none The patient was monitored for 30 minutes, fetal heart rate tracing was deemed reactive, category I tracing,   Discharge Condition: good  Disposition: 01-Home or Self Care  Diet: Regular diet  Discharge Activity: Activity as tolerated  Discharge Instructions    Discharge activity:  No Restrictions    Complete by:  As directed    Discharge diet:  No restrictions    Complete by:  As directed    No sexual activity restrictions    Complete by:  As directed    Notify physician for a general feeling that "something is not right"    Complete by:  As directed    Notify physician for increase or change in vaginal discharge    Complete by:  As directed    Notify physician for intestinal cramps, with or without diarrhea, sometimes described as "gas pain"    Complete by:  As directed    Notify physician for leaking of fluid    Complete by:  As directed    Notify physician for low, dull backache, unrelieved by heat or Tylenol    Complete by:  As directed    Notify physician for menstrual like cramps    Complete by:  As directed    Notify physician for pelvic pressure    Complete by:  As directed    Notify physician for uterine contractions.  These may be painless and feel like the uterus is tightening or the baby is  "balling up"    Complete by:  As directed    Notify physician for vaginal bleeding    Complete by:  As directed    PRETERM LABOR:  Includes any of the follwing symptoms that  occur between 20 - [redacted] weeks gestation.  If these symptoms are not stopped, preterm labor can result in preterm delivery, placing your baby at risk    Complete by:  As directed      Allergies as of 04/03/2017   No Known Allergies     Medication List    TAKE these medications   CONCEPT DHA 53.5-38-1 MG Caps Take 1 tablet by mouth daily.        Total time spent taking care of this patient: 20 minutes  Signed: Vena Austriandreas Enrique Manganaro 04/03/2017, 12:11 AM

## 2017-04-07 ENCOUNTER — Ambulatory Visit (INDEPENDENT_AMBULATORY_CARE_PROVIDER_SITE_OTHER): Payer: Medicaid Other | Admitting: Advanced Practice Midwife

## 2017-04-07 VITALS — BP 120/70 | Wt 254.0 lb

## 2017-04-07 DIAGNOSIS — Z3A33 33 weeks gestation of pregnancy: Secondary | ICD-10-CM

## 2017-04-07 NOTE — Progress Notes (Signed)
Doing well today with good fetal movement. Here at 1 week rob due to scheduling error. No complaints. Reviewed starting valtrex at 36 weeks.

## 2017-04-21 ENCOUNTER — Ambulatory Visit (INDEPENDENT_AMBULATORY_CARE_PROVIDER_SITE_OTHER): Payer: Medicaid Other | Admitting: Obstetrics & Gynecology

## 2017-04-21 VITALS — BP 110/60 | Wt 253.0 lb

## 2017-04-21 DIAGNOSIS — Z6841 Body Mass Index (BMI) 40.0 and over, adult: Secondary | ICD-10-CM

## 2017-04-21 DIAGNOSIS — Z3A35 35 weeks gestation of pregnancy: Secondary | ICD-10-CM

## 2017-04-21 DIAGNOSIS — O0990 Supervision of high risk pregnancy, unspecified, unspecified trimester: Secondary | ICD-10-CM

## 2017-04-21 DIAGNOSIS — O99210 Obesity complicating pregnancy, unspecified trimester: Secondary | ICD-10-CM

## 2017-04-21 MED ORDER — VALACYCLOVIR HCL 500 MG PO TABS: 500.0000 mg | ORAL_TABLET | Freq: Every day | ORAL | 11 refills | Status: DC

## 2017-04-21 NOTE — Patient Instructions (Signed)
Third Trimester of Pregnancy The third trimester is from week 28 through week 40 (months 7 through 9). The third trimester is a time when the unborn baby (fetus) is growing rapidly. At the end of the ninth month, the fetus is about 20 inches in length and weighs 6-10 pounds. Body changes during your third trimester Your body will continue to go through many changes during pregnancy. The changes vary from woman to woman. During the third trimester:  Your weight will continue to increase. You can expect to gain 25-35 pounds (11-16 kg) by the end of the pregnancy.  You may begin to get stretch marks on your hips, abdomen, and breasts.  You may urinate more often because the fetus is moving lower into your pelvis and pressing on your bladder.  You may develop or continue to have heartburn. This is caused by increased hormones that slow down muscles in the digestive tract.  You may develop or continue to have constipation because increased hormones slow digestion and cause the muscles that push waste through your intestines to relax.  You may develop hemorrhoids. These are swollen veins (varicose veins) in the rectum that can itch or be painful.  You may develop swollen, bulging veins (varicose veins) in your legs.  You may have increased body aches in the pelvis, back, or thighs. This is due to weight gain and increased hormones that are relaxing your joints.  You may have changes in your hair. These can include thickening of your hair, rapid growth, and changes in texture. Some women also have hair loss during or after pregnancy, or hair that feels dry or thin. Your hair will most likely return to normal after your baby is born.  Your breasts will continue to grow and they will continue to become tender. A yellow fluid (colostrum) may leak from your breasts. This is the first milk you are producing for your baby.  Your belly button may stick out.  You may notice more swelling in your hands,  face, or ankles.  You may have increased tingling or numbness in your hands, arms, and legs. The skin on your belly may also feel numb.  You may feel short of breath because of your expanding uterus.  You may have more problems sleeping. This can be caused by the size of your belly, increased need to urinate, and an increase in your body's metabolism.  You may notice the fetus "dropping," or moving lower in your abdomen (lightening).  You may have increased vaginal discharge.  You may notice your joints feel loose and you may have pain around your pelvic bone.  What to expect at prenatal visits You will have prenatal exams every 2 weeks until week 36. Then you will have weekly prenatal exams. During a routine prenatal visit:  You will be weighed to make sure you and the baby are growing normally.  Your blood pressure will be taken.  Your abdomen will be measured to track your baby's growth.  The fetal heartbeat will be listened to.  Any test results from the previous visit will be discussed.  You may have a cervical check near your due date to see if your cervix has softened or thinned (effaced).  You will be tested for Group B streptococcus. This happens between 35 and 37 weeks.  Your health care provider may ask you:  What your birth plan is.  How you are feeling.  If you are feeling the baby move.  If you have had   any abnormal symptoms, such as leaking fluid, bleeding, severe headaches, or abdominal cramping.  If you are using any tobacco products, including cigarettes, chewing tobacco, and electronic cigarettes.  If you have any questions.  Other tests or screenings that may be performed during your third trimester include:  Blood tests that check for low iron levels (anemia).  Fetal testing to check the health, activity level, and growth of the fetus. Testing is done if you have certain medical conditions or if there are problems during the  pregnancy.  Nonstress test (NST). This test checks the health of your baby to make sure there are no signs of problems, such as the baby not getting enough oxygen. During this test, a belt is placed around your belly. The baby is made to move, and its heart rate is monitored during movement.  What is false labor? False labor is a condition in which you feel small, irregular tightenings of the muscles in the womb (contractions) that usually go away with rest, changing position, or drinking water. These are called Braxton Hicks contractions. Contractions may last for hours, days, or even weeks before true labor sets in. If contractions come at regular intervals, become more frequent, increase in intensity, or become painful, you should see your health care provider. What are the signs of labor?  Abdominal cramps.  Regular contractions that start at 10 minutes apart and become stronger and more frequent with time.  Contractions that start on the top of the uterus and spread down to the lower abdomen and back.  Increased pelvic pressure and dull back pain.  A watery or bloody mucus discharge that comes from the vagina.  Leaking of amniotic fluid. This is also known as your "water breaking." It could be a slow trickle or a gush. Let your health care provider know if it has a color or strange odor. If you have any of these signs, call your health care provider right away, even if it is before your due date. Follow these instructions at home: Medicines  Follow your health care provider's instructions regarding medicine use. Specific medicines may be either safe or unsafe to take during pregnancy.  Take a prenatal vitamin that contains at least 600 micrograms (mcg) of folic acid.  If you develop constipation, try taking a stool softener if your health care provider approves. Eating and drinking  Eat a balanced diet that includes fresh fruits and vegetables, whole grains, good sources of protein  such as meat, eggs, or tofu, and low-fat dairy. Your health care provider will help you determine the amount of weight gain that is right for you.  Avoid raw meat and uncooked cheese. These carry germs that can cause birth defects in the baby.  If you have low calcium intake from food, talk to your health care provider about whether you should take a daily calcium supplement.  Eat four or five small meals rather than three large meals a day.  Limit foods that are high in fat and processed sugars, such as fried and sweet foods.  To prevent constipation: ? Drink enough fluid to keep your urine clear or pale yellow. ? Eat foods that are high in fiber, such as fresh fruits and vegetables, whole grains, and beans. Activity  Exercise only as directed by your health care provider. Most women can continue their usual exercise routine during pregnancy. Try to exercise for 30 minutes at least 5 days a week. Stop exercising if you experience uterine contractions.  Avoid heavy   lifting.  Do not exercise in extreme heat or humidity, or at high altitudes.  Wear low-heel, comfortable shoes.  Practice good posture.  You may continue to have sex unless your health care provider tells you otherwise. Relieving pain and discomfort  Take frequent breaks and rest with your legs elevated if you have leg cramps or low back pain.  Take warm sitz baths to soothe any pain or discomfort caused by hemorrhoids. Use hemorrhoid cream if your health care provider approves.  Wear a good support bra to prevent discomfort from breast tenderness.  If you develop varicose veins: ? Wear support pantyhose or compression stockings as told by your healthcare provider. ? Elevate your feet for 15 minutes, 3-4 times a day. Prenatal care  Write down your questions. Take them to your prenatal visits.  Keep all your prenatal visits as told by your health care provider. This is important. Safety  Wear your seat belt at  all times when driving.  Make a list of emergency phone numbers, including numbers for family, friends, the hospital, and police and fire departments. General instructions  Avoid cat litter boxes and soil used by cats. These carry germs that can cause birth defects in the baby. If you have a cat, ask someone to clean the litter box for you.  Do not travel far distances unless it is absolutely necessary and only with the approval of your health care provider.  Do not use hot tubs, steam rooms, or saunas.  Do not drink alcohol.  Do not use any products that contain nicotine or tobacco, such as cigarettes and e-cigarettes. If you need help quitting, ask your health care provider.  Do not use any medicinal herbs or unprescribed drugs. These chemicals affect the formation and growth of the baby.  Do not douche or use tampons or scented sanitary pads.  Do not cross your legs for long periods of time.  To prepare for the arrival of your baby: ? Take prenatal classes to understand, practice, and ask questions about labor and delivery. ? Make a trial run to the hospital. ? Visit the hospital and tour the maternity area. ? Arrange for maternity or paternity leave through employers. ? Arrange for family and friends to take care of pets while you are in the hospital. ? Purchase a rear-facing car seat and make sure you know how to install it in your car. ? Pack your hospital bag. ? Prepare the baby's nursery. Make sure to remove all pillows and stuffed animals from the baby's crib to prevent suffocation.  Visit your dentist if you have not gone during your pregnancy. Use a soft toothbrush to brush your teeth and be gentle when you floss. Contact a health care provider if:  You are unsure if you are in labor or if your water has broken.  You become dizzy.  You have mild pelvic cramps, pelvic pressure, or nagging pain in your abdominal area.  You have lower back pain.  You have persistent  nausea, vomiting, or diarrhea.  You have an unusual or bad smelling vaginal discharge.  You have pain when you urinate. Get help right away if:  Your water breaks before 37 weeks.  You have regular contractions less than 5 minutes apart before 37 weeks.  You have a fever.  You are leaking fluid from your vagina.  You have spotting or bleeding from your vagina.  You have severe abdominal pain or cramping.  You have rapid weight loss or weight gain.    You have shortness of breath with chest pain.  You notice sudden or extreme swelling of your face, hands, ankles, feet, or legs.  Your baby makes fewer than 10 movements in 2 hours.  You have severe headaches that do not go away when you take medicine.  You have vision changes. Summary  The third trimester is from week 28 through week 40, months 7 through 9. The third trimester is a time when the unborn baby (fetus) is growing rapidly.  During the third trimester, your discomfort may increase as you and your baby continue to gain weight. You may have abdominal, leg, and back pain, sleeping problems, and an increased need to urinate.  During the third trimester your breasts will keep growing and they will continue to become tender. A yellow fluid (colostrum) may leak from your breasts. This is the first milk you are producing for your baby.  False labor is a condition in which you feel small, irregular tightenings of the muscles in the womb (contractions) that eventually go away. These are called Braxton Hicks contractions. Contractions may last for hours, days, or even weeks before true labor sets in.  Signs of labor can include: abdominal cramps; regular contractions that start at 10 minutes apart and become stronger and more frequent with time; watery or bloody mucus discharge that comes from the vagina; increased pelvic pressure and dull back pain; and leaking of amniotic fluid. This information is not intended to replace advice  given to you by your health care provider. Make sure you discuss any questions you have with your health care provider. Document Released: 10/21/2001 Document Revised: 04/03/2016 Document Reviewed: 12/28/2012 Elsevier Interactive Patient Education  2017 [employer]  

## 2017-04-21 NOTE — Progress Notes (Signed)
PNV, [facility], Labor precautions Valtrex Breast, Pill

## 2017-04-27 ENCOUNTER — Ambulatory Visit (INDEPENDENT_AMBULATORY_CARE_PROVIDER_SITE_OTHER): Payer: Medicaid Other | Admitting: Obstetrics and Gynecology

## 2017-04-27 ENCOUNTER — Telehealth: Payer: Self-pay

## 2017-04-27 VITALS — BP 118/76 | Wt 256.0 lb

## 2017-04-27 DIAGNOSIS — Z8659 Personal history of other mental and behavioral disorders: Secondary | ICD-10-CM

## 2017-04-27 DIAGNOSIS — O0990 Supervision of high risk pregnancy, unspecified, unspecified trimester: Secondary | ICD-10-CM

## 2017-04-27 DIAGNOSIS — O99210 Obesity complicating pregnancy, unspecified trimester: Secondary | ICD-10-CM

## 2017-04-27 DIAGNOSIS — A6000 Herpesviral infection of urogenital system, unspecified: Secondary | ICD-10-CM

## 2017-04-27 DIAGNOSIS — Z6841 Body Mass Index (BMI) 40.0 and over, adult: Secondary | ICD-10-CM

## 2017-04-27 MED ORDER — VALACYCLOVIR HCL 500 MG PO TABS: 500.0000 mg | ORAL_TABLET | Freq: Two times a day (BID) | ORAL | 3 refills | Status: DC

## 2017-04-27 MED ORDER — VALACYCLOVIR HCL 500 MG PO TABS: 500.0000 mg | ORAL_TABLET | Freq: Every day | ORAL | 11 refills | Status: DC

## 2017-04-27 MED ORDER — VALACYCLOVIR HCL 500 MG PO TABS: 500.0000 mg | ORAL_TABLET | Freq: Two times a day (BID) | ORAL | 4 refills | Status: AC

## 2017-04-27 NOTE — Progress Notes (Signed)
No vb. No lof. GBS/Aptima today. Needs growth u/s and starting NST Start valtrex 500mg  po bid today.  Small right upper vulvar lesion, appears herpetic. No other lesions (cervical, vaginal, vulvar) Culture for HSV to be sure, but is most likely cause.

## 2017-04-27 NOTE — Telephone Encounter (Signed)
Pt is 36wks and is supposed to take valtrex but doesn't have new rx PH sent in.  [facility]  She did start it with some she had left over.  She hasn't had an outbreak in seven years.  She has a place on her bottom that may be herpes and would like to have appt.  Appt made for 2:10 today c SDJ.  I changed pharmacies and sent rx in.  Pt aware.

## 2017-04-29 ENCOUNTER — Telehealth: Payer: Self-pay | Admitting: Obstetrics and Gynecology

## 2017-04-29 NOTE — Telephone Encounter (Signed)
Tried to call pt. No answer and unable to leave voicemail. She needs to be seen by her dentist.

## 2017-04-29 NOTE — Telephone Encounter (Signed)
Pt aware and plans to contact her dentist for appt.

## 2017-04-29 NOTE — Telephone Encounter (Signed)
Pt called after hour nurse line. Pt is wanting to know : [redacted] week Pregnant has a very bad toothache. Tylenol is not working and wants to know what else she can take. Please advise

## 2017-04-30 ENCOUNTER — Encounter: Payer: Self-pay | Admitting: Obstetrics and Gynecology

## 2017-04-30 LAB — GC/CHLAMYDIA PROBE AMP
Chlamydia trachomatis, NAA: NEGATIVE
NEISSERIA GONORRHOEAE BY PCR: NEGATIVE

## 2017-04-30 LAB — HERPES SIMPLEX VIRUS CULTURE

## 2017-04-30 LAB — STREP GP B NAA: Strep Gp B NAA: POSITIVE — AB

## 2017-04-30 MED ORDER — CITRANATAL BLOOM 90-1 MG PO TABS: 1.0000 | ORAL_TABLET | Freq: Every day | ORAL | 1 refills | Status: DC

## 2017-04-30 NOTE — Addendum Note (Signed)
Addended by: Reather LittlerUTHUS, Nikita Humble D on: 04/30/2017 02:51 PM   Modules accepted: Orders

## 2017-05-05 ENCOUNTER — Encounter: Payer: Self-pay | Admitting: Obstetrics and Gynecology

## 2017-05-05 ENCOUNTER — Ambulatory Visit (INDEPENDENT_AMBULATORY_CARE_PROVIDER_SITE_OTHER): Payer: Medicaid Other

## 2017-05-05 ENCOUNTER — Ambulatory Visit (INDEPENDENT_AMBULATORY_CARE_PROVIDER_SITE_OTHER): Payer: Medicaid Other | Admitting: Obstetrics and Gynecology

## 2017-05-05 ENCOUNTER — Encounter: Payer: Medicaid Other | Admitting: Obstetrics and Gynecology

## 2017-05-05 ENCOUNTER — Other Ambulatory Visit: Payer: Self-pay | Admitting: Obstetrics and Gynecology

## 2017-05-05 VITALS — BP 124/70 | Wt 257.0 lb

## 2017-05-05 DIAGNOSIS — O99213 Obesity complicating pregnancy, third trimester: Secondary | ICD-10-CM

## 2017-05-05 DIAGNOSIS — O99210 Obesity complicating pregnancy, unspecified trimester: Secondary | ICD-10-CM

## 2017-05-05 DIAGNOSIS — Z6841 Body Mass Index (BMI) 40.0 and over, adult: Secondary | ICD-10-CM

## 2017-05-05 DIAGNOSIS — O0990 Supervision of high risk pregnancy, unspecified, unspecified trimester: Secondary | ICD-10-CM | POA: Diagnosis not present

## 2017-05-05 DIAGNOSIS — Z8659 Personal history of other mental and behavioral disorders: Secondary | ICD-10-CM

## 2017-05-05 DIAGNOSIS — Z3A37 37 weeks gestation of pregnancy: Secondary | ICD-10-CM

## 2017-05-05 DIAGNOSIS — A6000 Herpesviral infection of urogenital system, unspecified: Secondary | ICD-10-CM

## 2017-05-05 NOTE — Progress Notes (Signed)
Growth u/s 64th%ile, vtx, AFI 11cm. NST today. No vb. No lof. HSV culture from last week negative. Continue to monitor for other lesions. She states her lesion has gone away today. No other lesions or prodromal symptoms.  Note provided for dentist today.  HR noted to be 106 on u/s today, but NST is very reassuring.   Baseline FHR: 125 beats/min Variability: moderate Accelerations: present Decelerations: absent Tocometry: not done  Interpretation:  INDICATIONS: obesity, BMI >40 RESULTS:  A NST procedure was performed with FHR monitoring and a normal baseline established, appropriate time of 20-40 minutes of evaluation, and accels >2 seen w 15x15 characteristics.  Results show a REACTIVE NST.

## 2017-05-14 ENCOUNTER — Other Ambulatory Visit: Payer: Self-pay | Admitting: Certified Nurse Midwife

## 2017-05-14 ENCOUNTER — Ambulatory Visit (INDEPENDENT_AMBULATORY_CARE_PROVIDER_SITE_OTHER): Payer: Medicaid Other | Admitting: Certified Nurse Midwife

## 2017-05-14 ENCOUNTER — Ambulatory Visit (INDEPENDENT_AMBULATORY_CARE_PROVIDER_SITE_OTHER): Payer: Medicaid Other

## 2017-05-14 VITALS — BP 120/70 | Wt 256.0 lb

## 2017-05-14 DIAGNOSIS — Z6841 Body Mass Index (BMI) 40.0 and over, adult: Principal | ICD-10-CM

## 2017-05-14 DIAGNOSIS — O99210 Obesity complicating pregnancy, unspecified trimester: Secondary | ICD-10-CM

## 2017-05-14 DIAGNOSIS — O0993 Supervision of high risk pregnancy, unspecified, third trimester: Secondary | ICD-10-CM | POA: Diagnosis not present

## 2017-05-14 DIAGNOSIS — Z3A38 38 weeks gestation of pregnancy: Secondary | ICD-10-CM

## 2017-05-14 DIAGNOSIS — Z362 Encounter for other antenatal screening follow-up: Secondary | ICD-10-CM

## 2017-05-14 NOTE — Progress Notes (Signed)
Irregular contractions/ Baby not as active last night, but normal activity today NST reactive with baseline 130 and accelerations to 160s to 170s, moderate variability AFI=15.56cm/ vertex Taking Valtrex BID [facility] instructions Labor precautions Previous 2 labors induced. NST and AFI in 1 week

## 2017-05-14 NOTE — Progress Notes (Signed)
Pt reports no problems. AFI/NST today. Intermittent ctx.

## 2017-05-18 ENCOUNTER — Emergency Department: Payer: Medicaid Other

## 2017-05-18 ENCOUNTER — Encounter: Payer: Self-pay | Admitting: Emergency Medicine

## 2017-05-18 ENCOUNTER — Observation Stay
Admission: EM | Admit: 2017-05-18 | Discharge: 2017-05-19 | Disposition: A | Discharge status: Home / Self Care | Payer: Medicaid Other | Attending: Obstetrics & Gynecology | Admitting: Obstetrics & Gynecology

## 2017-05-18 DIAGNOSIS — O26893 Other specified pregnancy related conditions, third trimester: Secondary | ICD-10-CM | POA: Insufficient documentation

## 2017-05-18 DIAGNOSIS — R42 Dizziness and giddiness: Secondary | ICD-10-CM | POA: Insufficient documentation

## 2017-05-18 DIAGNOSIS — R200 Anesthesia of skin: Secondary | ICD-10-CM | POA: Insufficient documentation

## 2017-05-18 DIAGNOSIS — O99713 Diseases of the skin and subcutaneous tissue complicating pregnancy, third trimester: Secondary | ICD-10-CM | POA: Diagnosis not present

## 2017-05-18 DIAGNOSIS — I639 Cerebral infarction, unspecified: Secondary | ICD-10-CM

## 2017-05-18 DIAGNOSIS — Z3A39 39 weeks gestation of pregnancy: Secondary | ICD-10-CM | POA: Diagnosis not present

## 2017-05-18 DIAGNOSIS — G459 Transient cerebral ischemic attack, unspecified: Secondary | ICD-10-CM

## 2017-05-18 DIAGNOSIS — R531 Weakness: Secondary | ICD-10-CM | POA: Insufficient documentation

## 2017-05-18 DIAGNOSIS — R202 Paresthesia of skin: Secondary | ICD-10-CM

## 2017-05-18 DIAGNOSIS — H539 Unspecified visual disturbance: Secondary | ICD-10-CM

## 2017-05-18 LAB — URINALYSIS, COMPLETE (UACMP) WITH MICROSCOPIC
BILIRUBIN URINE: NEGATIVE
Bacteria, UA: NONE SEEN
GLUCOSE, UA: NEGATIVE mg/dL
Hgb urine dipstick: NEGATIVE
KETONES UR: NEGATIVE mg/dL
LEUKOCYTES UA: NEGATIVE
Nitrite: NEGATIVE
PH: 7 (ref 5.0–8.0)
Protein, ur: NEGATIVE mg/dL
RBC / HPF: NONE SEEN RBC/hpf (ref 0–5)
Specific Gravity, Urine: 1.009 (ref 1.005–1.030)

## 2017-05-18 LAB — CBC
HCT: 26.5 % — ABNORMAL LOW (ref 35.0–47.0)
Hemoglobin: 8.8 g/dL — ABNORMAL LOW (ref 12.0–16.0)
MCH: 23.3 pg — ABNORMAL LOW (ref 26.0–34.0)
MCHC: 33.3 g/dL (ref 32.0–36.0)
MCV: 69.8 fL — AB (ref 80.0–100.0)
PLATELETS: 169 10*3/uL (ref 150–440)
RBC: 3.8 MIL/uL (ref 3.80–5.20)
RDW: 15.4 % — ABNORMAL HIGH (ref 11.5–14.5)
WBC: 9.1 10*3/uL (ref 3.6–11.0)

## 2017-05-18 LAB — GLUCOSE, CAPILLARY: Glucose-Capillary: 85 mg/dL (ref 65–99)

## 2017-05-18 LAB — URINE DRUG SCREEN, QUALITATIVE (ARMC ONLY)
Amphetamines, Ur Screen: NOT DETECTED
BARBITURATES, UR SCREEN: NOT DETECTED
Benzodiazepine, Ur Scrn: NOT DETECTED
CANNABINOID 50 NG, UR ~~LOC~~: NOT DETECTED
Cocaine Metabolite,Ur ~~LOC~~: NOT DETECTED
MDMA (ECSTASY) UR SCREEN: NOT DETECTED
Methadone Scn, Ur: NOT DETECTED
Opiate, Ur Screen: NOT DETECTED
PHENCYCLIDINE (PCP) UR S: NOT DETECTED
Tricyclic, Ur Screen: NOT DETECTED

## 2017-05-18 LAB — BASIC METABOLIC PANEL
Anion gap: 7 (ref 5–15)
BUN: 9 mg/dL (ref 6–20)
CALCIUM: 8.6 mg/dL — AB (ref 8.9–10.3)
CHLORIDE: 105 mmol/L (ref 101–111)
CO2: 24 mmol/L (ref 22–32)
CREATININE: 0.72 mg/dL (ref 0.44–1.00)
GFR calc non Af Amer: >60 mL/min (ref >60)
Glucose, Bld: 103 mg/dL — ABNORMAL HIGH (ref 65–99)
Potassium: 3.7 mmol/L (ref 3.5–5.1)
Sodium: 136 mmol/L (ref 135–145)

## 2017-05-18 NOTE — Discharge Instructions (Signed)
Fortunately today your MRI was negative for stroke in the blood work was unremarkable.

## 2017-05-18 NOTE — ED Notes (Addendum)
Pt states while driving, sudden onset right facial numbness, left arm numbness and tingling, blurred vision, and "just not feeling right" when engaging in conversation (impaired comprehension)   Pt states face numbness resolved, tingling remains in arm and face, vision improved "I still just don't feel right, but I feel better than I did earlier"

## 2017-05-18 NOTE — ED Notes (Signed)
SOC at bedside. 

## 2017-05-18 NOTE — ED Provider Notes (Signed)
[facility] Emergency Department Provider Note  ____________________________________________   First MD Initiated Contact with Patient 05/18/17 2100     (approximate)  I have reviewed the triage vital signs and the nursing notes.   HISTORY  Chief Complaint Weakness   HPI Lisa House is a 26 y.o. female who comes to the emergency department roughly 20 minutes after sustaining numbness to the right side of her face numbness to her left arm and weakness to her left arm. It all began when she was driving her car and she felt like her vision was "off" and she felt like other cars were driving into her lane. She pulled over and noted numbness on her right chin and the right side of her face and numbness in her left arm. By the time she got to the emergency department she attempted to lift her left arm but felt that it was weak and heavy. She is [redacted] weeks pregnant currently. She's never had a stroke before. She has never had a heart attack.   Past Medical History:  Diagnosis Date  . Allergy   . Depression   . Eczema   . GERD (gastroesophageal reflux disease)   . Intertrigo   . Irritable bowel syndrome   . OCD (obsessive compulsive disorder)   . Panic attack   . Polycystic ovarian disease   . Skin lesion     Patient Active Problem List   Diagnosis Date Noted  . Decreased fetal movement 04/03/2017  . BMI 40.0-44.9, adult (HCC) 02/27/2017  . High risk pregnancy, antepartum 02/08/2017  . Herpes genitalis 02/08/2017  . IBS (irritable bowel syndrome) 05/09/2015  . Obesity in pregnancy, antepartum 05/09/2015  . OCD (obsessive compulsive disorder) 05/09/2015  . Bilateral polycystic ovarian syndrome 05/09/2015  . Psoriasis 05/09/2015  . History of depression 05/09/2015  . Allergic rhinitis 05/02/2010    Past Surgical History:  Procedure Laterality Date  . CHOLECYSTECTOMY  01/09/2011   Dr. Excell Seltzer    Prior to Admission medications   Medication Sig  Start Date End Date Taking? Authorizing Provider  Prenatal-DSS-FeCb-FeGl-FA (CITRANATAL BLOOM) 90-1 MG TABS Take 1 tablet by mouth daily. 04/30/17  Yes Farrel Conners, [name]  valACYclovir (VALTREX) 500 MG tablet Take 1 tablet (500 mg total) by mouth 2 (two) times daily. 04/27/17 05/27/17 Yes Conard Novak, MD  Prenat-FeFum-FePo-FA-Omega 3 (CONCEPT DHA) 53.5-38-1 MG CAPS Take 1 tablet by mouth daily. Patient not taking: Reported on 05/14/2017 09/22/16   Schaevitz, Myra Rude, MD    Allergies Patient has no known allergies.  Family History  Problem Relation Age of Onset  . Hypertension Mother   . Diabetes Mother   . Headache Mother   . Hypertension Father   . Lung cancer Father   . Heart disease Son        Aortic Valve Disorder Stenosis-repaired    Social History Social History  Substance Use Topics  . Smoking status: Never Smoker  . Smokeless tobacco: Never Used  . Alcohol use No    Review of Systems Constitutional: No fever/chills Eyes: Positive visual changes. ENT: No sore throat. Cardiovascular: Denies chest pain. Respiratory: Denies shortness of breath. Gastrointestinal: No abdominal pain.  No nausea, no vomiting.  No diarrhea.  No constipation. Genitourinary: Negative for dysuria. Musculoskeletal: Negative for back pain. Skin: Negative for rash. Neurological: Negative for headaches, positive for numbness and weakness   ____________________________________________   PHYSICAL EXAM:  VITAL SIGNS: ED Triage Vitals [05/18/17 2047]  Enc Vitals Group  BP (!) 136/91     Pulse Rate 90     Resp 16     Temp 98 F (36.7 C)     Temp Source Oral     SpO2 100 %     Weight 250 lb (113.4 kg)     Height 5\' 5"  (1.651 m)     Head Circumference      Peak Flow      Pain Score      Pain Loc      Pain Edu?      Excl. in GC?     Constitutional: Alert and oriented 4 well appearing nontoxic no diaphoresis speaks in full clear sentences Eyes: PERRL EOMI. Head:  Atraumatic. Nose: No congestion/rhinnorhea. Mouth/Throat: No trismus Neck: No stridor.   Cardiovascular: Normal rate, regular rhythm. Grossly normal heart sounds.  Good peripheral circulation. Respiratory: Normal respiratory effort.  No retractions. Lungs CTAB and moving good air Gastrointestinal: Gravid nontender Musculoskeletal: No lower extremity edema   Neurologic:  Normal speech and language.  Cranial nerves II through XII intact slightly left-sided pronator drift 5 out of 5 grips biceps triceps hip flexion and extension plantar flexion dorsiflexion sensation intact to light touch throughout 2+ DTRs no ankle clonus Skin:  Skin is warm, dry and intact. No rash noted. Psychiatric: Mood and affect are normal. Speech and behavior are normal.    ____________________________________________   DIFFERENTIAL includes but not limited to  Stroke, TIA, cerebral venous thrombosis, hyperventilation, metabolic derangement, migraine headache ____________________________________________   LABS (all labs ordered are listed, but only abnormal results are displayed)  Labs Reviewed  BASIC METABOLIC PANEL - Abnormal; Notable for the following:       Result Value   Glucose, Bld 103 (*)    Calcium 8.6 (*)    All other components within normal limits  CBC - Abnormal; Notable for the following:    Hemoglobin 8.8 (*)    HCT 26.5 (*)    MCV 69.8 (*)    MCH 23.3 (*)    RDW 15.4 (*)    All other components within normal limits  URINALYSIS, COMPLETE (UACMP) WITH MICROSCOPIC - Abnormal; Notable for the following:    Color, Urine YELLOW (*)    APPearance CLEAR (*)    Squamous Epithelial / LPF 0-5 (*)    All other components within normal limits  GLUCOSE, CAPILLARY  URINE DRUG SCREEN, QUALITATIVE ([facility] ONLY)  ETHANOL  PROTIME-INR  APTT  DIFFERENTIAL  HEPATIC FUNCTION PANEL  CBG MONITORING, ED    Labs unremarkable. Anemia is at baseline __________________________________________  EKG  ED  ECG REPORT I, Merrily BrittleNeil Taylynn Easton, the attending physician, personally viewed and interpreted this ECG.  Date: 05/18/2017 Rate: 88 Rhythm: normal sinus rhythm QRS Axis: normal Intervals: normal ST/T Wave abnormalities: normal Narrative Interpretation: unremarkable ________________________________________  RADIOLOGY  MRI with no acute disease ____________________________________________   PROCEDURES  Procedure(s) performed: no  Procedures  Critical Care performed: yes  CRITICAL CARE Performed by: Merrily BrittleNeil Kameelah Minish   Total critical care time: 35 minutes  Critical care time was exclusive of separately billable procedures and treating other patients.  Critical care was necessary to treat or prevent imminent or life-threatening deterioration.  Critical care was time spent personally by me on the following activities: development of treatment plan with patient and/or surrogate as well as nursing, discussions with consultants, evaluation of patient's response to treatment, examination of patient, obtaining history from patient or surrogate, ordering and performing treatments and interventions, ordering and review of  laboratory studies, ordering and review of radiographic studies, pulse oximetry and re-evaluation of patient's condition.   Observation: no ____________________________________________   INITIAL IMPRESSION / ASSESSMENT AND PLAN / ED COURSE  Pertinent labs & imaging results that were available during my care of the patient were reviewed by me and considered in my medical decision making (see chart for details).  The patient arrives with mild left-sided pronator drift and a story concerning for an acute neurological event. As she is pregnant I will defer CT scan at this time and go instead to emergent MRI.     ----------------------------------------- 9:12 PM on 05/18/2017 -----------------------------------------  I discussed the case with Dr. Roseanne Reno on call for  [facility] hospitalist who recommends an MRI of the brain without contrast. He recommends against a CT scan of the head and he recommends against an MR venogram or an MR angiogram of her head as she is not having a headache. Code stroke called at 2112. ____________________________________________  I discussed the case with the specialist on call who is reassured that the MRI of the patient was negative however she is recommending inpatient admission for observation as well as for an echocardiogram and carotid Dopplers. The patient belongs to [facility] gynecology and I spoke with on-call midwife Tresea Mall who is working tonight with Dr. Jean Rosenthal and has graciously agreed to admit the patient to her service.  FINAL CLINICAL IMPRESSION(S) / ED DIAGNOSES  Final diagnoses:  Stroke (cerebrum) (HCC)  Weakness  Numbness  Vision changes      NEW MEDICATIONS STARTED DURING THIS VISIT:  New Prescriptions   No medications on file     Note:  This document was prepared using Dragon voice recognition software and may include unintentional dictation errors.     Merrily Brittle, MD 05/18/17 2342

## 2017-05-18 NOTE — ED Notes (Signed)
Pt returned from MRI °

## 2017-05-18 NOTE — ED Triage Notes (Signed)
Pt arrived to triage in wheelchair, pt crying. Pt reports sudden onset of facial numbness, bilateral extremity weakness/more in left side approximately 30 minutes ago while driving. No facial droop or drift in arms or legs. Pt is [redacted] weeks gestation, denies complications with pregnancy. Pt last OBGYN appointment was Thursday at [facility]. Dr. Don PerkingVeronese for order recommendations.

## 2017-05-19 ENCOUNTER — Inpatient Hospital Stay: Payer: Medicaid Other

## 2017-05-19 ENCOUNTER — Inpatient Hospital Stay (HOSPITAL_BASED_OUTPATIENT_CLINIC_OR_DEPARTMENT_OTHER)
Admit: 2017-05-19 | Discharge: 2017-05-19 | Disposition: A | Discharge status: Home / Self Care | Payer: Medicaid Other | Attending: Family Medicine | Admitting: Family Medicine

## 2017-05-19 ENCOUNTER — Encounter: Payer: Self-pay | Admitting: Obstetrics and Gynecology

## 2017-05-19 DIAGNOSIS — I340 Nonrheumatic mitral (valve) insufficiency: Secondary | ICD-10-CM

## 2017-05-19 DIAGNOSIS — R531 Weakness: Secondary | ICD-10-CM | POA: Diagnosis not present

## 2017-05-19 DIAGNOSIS — O26893 Other specified pregnancy related conditions, third trimester: Secondary | ICD-10-CM | POA: Diagnosis not present

## 2017-05-19 DIAGNOSIS — O99713 Diseases of the skin and subcutaneous tissue complicating pregnancy, third trimester: Secondary | ICD-10-CM | POA: Diagnosis not present

## 2017-05-19 DIAGNOSIS — Z3A39 39 weeks gestation of pregnancy: Secondary | ICD-10-CM | POA: Diagnosis not present

## 2017-05-19 LAB — DIFFERENTIAL
BASOS ABS: 0 10*3/uL (ref 0–0.1)
BASOS PCT: 0 %
EOS ABS: 0.1 10*3/uL (ref 0–0.7)
Eosinophils Relative: 1 %
Lymphocytes Relative: 24 %
Lymphs Abs: 2.1 10*3/uL (ref 1.0–3.6)
Monocytes Absolute: 0.5 10*3/uL (ref 0.2–0.9)
Monocytes Relative: 6 %
NEUTROS ABS: 6 10*3/uL (ref 1.4–6.5)
NEUTROS PCT: 69 %

## 2017-05-19 LAB — ECHOCARDIOGRAM COMPLETE
Height: 66 in
WEIGHTICAEL: 3984 [oz_av]

## 2017-05-19 LAB — HEPATIC FUNCTION PANEL
ALT: 12 U/L — ABNORMAL LOW (ref 14–54)
AST: 17 U/L (ref 15–41)
Albumin: 2.8 g/dL — ABNORMAL LOW (ref 3.5–5.0)
Alkaline Phosphatase: 126 U/L (ref 38–126)
BILIRUBIN TOTAL: 0.4 mg/dL (ref 0.3–1.2)
Total Protein: 6.6 g/dL (ref 6.5–8.1)

## 2017-05-19 LAB — ETHANOL

## 2017-05-19 LAB — LIPID PANEL
CHOLESTEROL: 173 mg/dL (ref 0–200)
HDL: 65 mg/dL (ref >40)
LDL Cholesterol: 84 mg/dL (ref 0–99)
Total CHOL/HDL Ratio: 2.7 RATIO
Triglycerides: 118 mg/dL (ref <150)
VLDL: 24 mg/dL (ref 0–40)

## 2017-05-19 LAB — APTT: aPTT: 32 seconds (ref 24–36)

## 2017-05-19 LAB — TSH: TSH: 3.986 u[IU]/mL (ref 0.350–4.500)

## 2017-05-19 LAB — PROTIME-INR
INR: 1.03
PROTHROMBIN TIME: 13.5 s (ref 11.4–15.2)

## 2017-05-19 MED ORDER — ASPIRIN EC 81 MG PO TBEC
81.0000 mg | DELAYED_RELEASE_TABLET | Freq: Every day | ORAL | Status: DC
  Administered 2017-05-19: 81 mg via ORAL
  Filled 2017-05-19 (×2): qty 1

## 2017-05-19 MED ORDER — ASPIRIN 81 MG PO CHEW
81.0000 mg | CHEWABLE_TABLET | Freq: Every day | ORAL | Status: DC
  Filled 2017-05-19: qty 1

## 2017-05-19 MED ORDER — PRENATAL PLUS 27-1 MG PO TABS
1.0000 | ORAL_TABLET | Freq: Every day | ORAL | Status: DC
  Administered 2017-05-19: 1 via ORAL
  Filled 2017-05-19: qty 1

## 2017-05-19 MED ORDER — DOCUSATE SODIUM 100 MG PO CAPS
100.0000 mg | ORAL_CAPSULE | Freq: Every day | ORAL | Status: DC
  Administered 2017-05-19: 100 mg via ORAL
  Filled 2017-05-19: qty 1

## 2017-05-19 MED ORDER — VALACYCLOVIR HCL 500 MG PO TABS: 500.0000 mg | ORAL_TABLET | Freq: Two times a day (BID) | ORAL | Status: DC

## 2017-05-19 MED ORDER — VALACYCLOVIR HCL 500 MG PO TABS
500.0000 mg | ORAL_TABLET | Freq: Every day | ORAL | Status: DC
  Administered 2017-05-19: 500 mg via ORAL
  Filled 2017-05-19 (×2): qty 1

## 2017-05-19 MED ORDER — ACETAMINOPHEN 500 MG PO TABS
1000.0000 mg | ORAL_TABLET | Freq: Four times a day (QID) | ORAL | Status: DC | PRN
  Administered 2017-05-19: 1000 mg via ORAL
  Filled 2017-05-19: qty 2

## 2017-05-19 MED ORDER — STROKE: EARLY STAGES OF RECOVERY BOOK
Freq: Once | Status: AC
  Administered 2017-05-19: 05:00:00

## 2017-05-19 MED ORDER — FERROUS FUMARATE 324 (106 FE) MG PO TABS
1.0000 | ORAL_TABLET | Freq: Every day | ORAL | Status: DC
  Administered 2017-05-19: 106 mg via ORAL
  Filled 2017-05-19: qty 1

## 2017-05-19 NOTE — ED Notes (Addendum)
Report to Kristen, RN.

## 2017-05-19 NOTE — Consult Note (Signed)
Referring Physician: Allena Katz    Chief Complaint: Left sided numbness  HPI: Lisa House is an 26 y.o. female now [redacted] weeks pregnant who was out with her mother on yesterday evening.  Had a strange feeling come over her but seemed to recover from it.  Then on her way driving home had a feeling of confusion come over her.  She felt as if the cars coming toward her were coming into her lane.  When she arrived home she saw dark circles that changed over to bright circles in her visual field.  When in the house her som was talking to her but she could not figure out what he was saying.  Her speech was normal.  She then noted numbness bilateally along the bottom of her face.  On the way to the ED had numbness develop in her left arm as well.  Symptoms of numbness lasted about 2 hours before resolving spontaneously.  Mental status did not return to baseline until 3 hours after onset of symptoms.  Currently she feels at baseline.    Date last known well: Date: 05/18/2017 Time last known well: Time: 20:00 tPA Given: No: Resolving symptoms  Past Medical History:  Diagnosis Date  . Allergy   . Depression   . Eczema   . GERD (gastroesophageal reflux disease)   . Intertrigo   . Irritable bowel syndrome   . OCD (obsessive compulsive disorder)   . Panic attack   . Polycystic ovarian disease   . Skin lesion     Past Surgical History:  Procedure Laterality Date  . CHOLECYSTECTOMY  01/09/2011   Dr. Excell Seltzer    Family History  Problem Relation Age of Onset  . Hypertension Mother   . Diabetes Mother   . Headache Mother   . Hypertension Father   . Lung cancer Father   . Heart disease Son        Aortic Valve Disorder Stenosis-repaired   Social History:  reports that she has never smoked. She has never used smokeless tobacco. She reports that she does not drink alcohol or use drugs.  Allergies: No Known Allergies  Medications:  I have reviewed the patient's current medications. Prior to  Admission:  Prescriptions Prior to Admission  Medication Sig Dispense Refill Last Dose  . Prenatal-DSS-FeCb-FeGl-FA (CITRANATAL BLOOM) 90-1 MG TABS Take 1 tablet by mouth daily. 30 tablet 1 05/18/2017 at Unknown time  . valACYclovir (VALTREX) 500 MG tablet Take 1 tablet (500 mg total) by mouth 2 (two) times daily. 60 tablet 4 05/18/2017 at Unknown time  . Prenat-FeFum-FePo-FA-Omega 3 (CONCEPT DHA) 53.5-38-1 MG CAPS Take 1 tablet by mouth daily. (Patient not taking: Reported on 05/14/2017) 30 capsule 0 Not Taking   Scheduled: . aspirin EC  81 mg Oral Daily  . docusate sodium  100 mg Oral Daily  . Ferrous Fumarate  1 tablet Oral Daily  . prenatal vitamin w/FE, FA  1 tablet Oral Q1200  . valACYclovir  500 mg Oral Daily    ROS: History obtained from the patient  General ROS: negative for - chills, fatigue, fever, night sweats, weight gain or weight loss Psychological ROS: negative for - behavioral disorder, hallucinations, memory difficulties, mood swings or suicidal ideation Ophthalmic ROS: as noted in HPI ENT ROS: negative for - epistaxis, nasal discharge, oral lesions, sore throat, tinnitus or vertigo Allergy and Immunology ROS: negative for - hives or itchy/watery eyes Hematological and Lymphatic ROS: negative for - bleeding problems, bruising or swollen lymph nodes  Endocrine ROS: negative for - galactorrhea, hair pattern changes, polydipsia/polyuria or temperature intolerance Respiratory ROS: negative for - cough, hemoptysis, shortness of breath or wheezing Cardiovascular ROS: negative for - chest pain, dyspnea on exertion, edema or irregular heartbeat Gastrointestinal ROS: negative for - abdominal pain, diarrhea, hematemesis, nausea/vomiting or stool incontinence Genito-Urinary ROS: negative for - dysuria, hematuria, incontinence or urinary frequency/urgency Musculoskeletal ROS: negative for - joint swelling or muscular weakness Neurological ROS: as noted in HPI Dermatological ROS:  negative for rash and skin lesion changes  Physical Examination: Blood pressure (!) 127/52, pulse 72, temperature 97.8 F (36.6 C), temperature source Oral, resp. rate 16, height 5\' 6"  (1.676 m), weight 112.9 kg (249 lb), last menstrual period 08/16/2016, SpO2 100 %.  HEENT-  Normocephalic, no lesions, without obvious abnormality.  Normal external eye and conjunctiva.  Normal TM's bilaterally.  Normal auditory canals and external ears. Normal external nose, mucus membranes and septum.  Normal pharynx. Cardiovascular- S1, S2 normal, pulses palpable throughout   Lungs- chest clear, no wheezing, rales, normal symmetric air entry Abdomen- soft, non-tender; bowel sounds normal; no masses,  no organomegaly Extremities- no edema Lymph-no adenopathy palpable Musculoskeletal-no joint tenderness, deformity or swelling Skin-warm and dry, no hyperpigmentation, vitiligo, or suspicious lesions  Neurological Examination   Mental Status: Alert, oriented, thought content appropriate.  Speech fluent without evidence of aphasia.  Able to follow 3 step commands without difficulty. Cranial Nerves: II: Discs flat bilaterally; Visual fields grossly normal, pupils equal, round, reactive to light and accommodation III,IV, VI: ptosis not present, extra-ocular motions intact bilaterally V,VII: smile symmetric, facial light touch sensation normal bilaterally VIII: hearing normal bilaterally IX,X: gag reflex present XI: bilateral shoulder shrug XII: midline tongue extension Motor: Right : Upper extremity   5/5    Left:     Upper extremity   5/5  Lower extremity   5/5     Lower extremity   5/5 Tone and bulk:normal tone throughout; no atrophy noted Sensory: Pinprick and light touch intact throughout, bilaterally Deep Tendon Reflexes: 2+ and symmetric throughout Plantars: Right: downgoing   Left: downgoing Cerebellar: Normal finger-to-nose and normal heel-to-shin testing bilaterally Gait: normal gait and  station    Laboratory Studies:  Basic Metabolic Panel:  Recent Labs Lab 05/18/17 2051  NA 136  K 3.7  CL 105  CO2 24  GLUCOSE 103*  BUN 9  CREATININE 0.72  CALCIUM 8.6*    Liver Function Tests:  Recent Labs Lab 05/18/17 2051  AST 17  ALT 12*  ALKPHOS 126  BILITOT 0.4  PROT 6.6  ALBUMIN 2.8*   No results for input(s): LIPASE, AMYLASE in the last 168 hours. No results for input(s): AMMONIA in the last 168 hours.  CBC:  Recent Labs Lab 05/18/17 2051  WBC 9.1  NEUTROABS 6.0  HGB 8.8*  HCT 26.5*  MCV 69.8*  PLT 169    Cardiac Enzymes: No results for input(s): CKTOTAL, CKMB, CKMBINDEX, TROPONINI in the last 168 hours.  BNP: Invalid input(s): POCBNP  CBG:  Recent Labs Lab 05/18/17 2047  GLUCAP 85    Microbiology: Results for orders placed or performed in visit on 04/27/17  Herpes simplex virus culture     Status: None   Collection Time: 04/27/17  2:44 PM  Result Value Ref Range Status   HSV Culture/Type Comment  Final    Comment: Negative No Herpes simplex virus isolated.   GC/Chlamydia Probe Amp     Status: None   Collection Time: 04/27/17  2:44 PM  Result Value Ref Range Status   Chlamydia trachomatis, NAA Negative Negative Final   Neisseria gonorrhoeae by PCR Negative Negative Final  Strep Gp B NAA     Status: Abnormal   Collection Time: 04/27/17  2:44 PM  Result Value Ref Range Status   Strep Gp B NAA Positive (A) Negative Final    Comment: Centers for Disease Control and Prevention (CDC) and American Congress of Obstetricians and Gynecologists (ACOG) guidelines for prevention of perinatal group B streptococcal (GBS) disease specify co-collection of a vaginal and rectal swab specimen to maximize sensitivity of GBS detection. Per the CDC and ACOG, swabbing both the lower vagina and rectum substantially increases the yield of detection compared with sampling the vagina alone. Penicillin G, ampicillin, or cefazolin are indicated for  intrapartum prophylaxis of perinatal GBS colonization. Reflex susceptibility testing should be performed prior to use of clindamycin only on GBS isolates from penicillin-allergic women who are considered a high risk for anaphylaxis. Treatment with vancomycin without additional testing is warranted if resistance to clindamycin is noted.     Coagulation Studies:  Recent Labs  05/18/17 2051  LABPROT 13.5  INR 1.03    Urinalysis:  Recent Labs Lab 05/18/17 2051  COLORURINE YELLOW*  LABSPEC 1.009  PHURINE 7.0  GLUCOSEU NEGATIVE  HGBUR NEGATIVE  BILIRUBINUR NEGATIVE  KETONESUR NEGATIVE  PROTEINUR NEGATIVE  NITRITE NEGATIVE  LEUKOCYTESUR NEGATIVE    Lipid Panel:    Component Value Date/Time   CHOL 173 05/19/2017 0539   TRIG 118 05/19/2017 0539   HDL 65 05/19/2017 0539   CHOLHDL 2.7 05/19/2017 0539   VLDL 24 05/19/2017 0539   LDLCALC 84 05/19/2017 0539    HgbA1C: No results found for: HGBA1C  Urine Drug Screen:     Component Value Date/Time   LABOPIA NONE DETECTED 05/18/2017 2110   COCAINSCRNUR NONE DETECTED 05/18/2017 2110   LABBENZ NONE DETECTED 05/18/2017 2110   AMPHETMU NONE DETECTED 05/18/2017 2110   THCU NONE DETECTED 05/18/2017 2110   LABBARB NONE DETECTED 05/18/2017 2110    Alcohol Level:  Recent Labs Lab 05/18/17 2051  ETH <5    Other results: EKG: normal sinus rhythm at 88 bpm.  Imaging: Mr Brain Wo Contrast (neuro Protocol)  Result Date: 05/18/2017 CLINICAL DATA:  Acute onset facial numbness, bilateral upper extremity weakness. Thirty-nine weeks pregnant. EXAM: MRI HEAD WITHOUT CONTRAST TECHNIQUE: Multiplanar, multiecho pulse sequences of the brain and surrounding structures were obtained without intravenous contrast. COMPARISON:  CT HEAD May 06, 2010 FINDINGS: BRAIN: No reduced diffusion to suggest acute ischemia. No susceptibility artifact to suggest hemorrhage. The ventricles and sulci are normal for patient's age. No suspicious parenchymal  signal, mass or mass effect. No abnormal extra-axial fluid collections. VASCULAR: Normal major intracranial vascular flow voids present at skull base. No abnormal signal along the course of the superior sagittal sinus. SKULL AND UPPER CERVICAL SPINE: No abnormal sellar expansion. No suspicious calvarial bone marrow signal. Craniocervical junction maintained. SINUSES/ORBITS: Mild paranasal sinus mucosal thickening without air-fluid levels. Mastoid air cells are well aerated. The included ocular globes and orbital contents are non-suspicious. OTHER: None. IMPRESSION: Normal noncontrast MRI head. Electronically Signed   By: Awilda Metro M.D.   On: 05/18/2017 22:31   US Carotid Bilateral  Result Date: 05/19/2017 CLINICAL DATA:  TIA symptoms, syncope, visual disturbance EXAM: BILATERAL CAROTID DUPLEX ULTRASOUND TECHNIQUE: Wallace Cullens scale imaging, color Doppler and duplex ultrasound were performed of bilateral carotid and vertebral arteries in the neck. COMPARISON:  05/18/2017 FINDINGS: Criteria: Quantification of carotid  stenosis is based on velocity parameters that correlate the residual internal carotid diameter with NASCET-based stenosis levels, using the diameter of the distal internal carotid lumen as the denominator for stenosis measurement. The following velocity measurements were obtained: RIGHT ICA:  79/26 cm/sec CCA:  132/21 cm/sec SYSTOLIC ICA/CCA RATIO:  0.6 DIASTOLIC ICA/CCA RATIO:  1.2 ECA:  135 cm/sec LEFT ICA:  89/21 cm/sec CCA:  143/26 cm/sec SYSTOLIC ICA/CCA RATIO:  0.6 DIASTOLIC ICA/CCA RATIO:  0.8 ECA:  98 cm/sec RIGHT CAROTID ARTERY: No significant carotid atherosclerosis. No hemodynamically significant right ICA stenosis, velocity elevation, or turbulent flow. Degree of narrowing less than 50%. RIGHT VERTEBRAL ARTERY:  Antegrade LEFT CAROTID ARTERY: No significant carotid atherosclerotic plaque formation. No hemodynamically significant left ICA stenosis, velocity elevation, or turbulent flow.  LEFT VERTEBRAL ARTERY:  Antegrade IMPRESSION: No significant carotid atherosclerosis or ICA stenosis by ultrasound. Degree of narrowing less than 50% bilaterally by ultrasound criteria. Patent antegrade vertebral flow bilaterally Electronically Signed   By: Judie PetitM.  Shick M.D.   On: 05/19/2017 11:45    Assessment: 26 y.o. female presenting with cognitive and visual complaints along with LUE paresthesias.  All symptoms have resolved and patient is at baseline.  MRI of the brain reviewed and is normal.  No evidence of infarct, hemorrhage or MS.  With patient in hypercoagulable state would have venous system evaluated.  Presentation likely represents a migraine equivalent and if venous imaging unremarkable no further neurological work up recommended at this time    Stroke Risk Factors - none  Plan: 1. MRV of the brain  Case discussed with Dr. Cora CollumPatel  Juliann Olesky, MD Neurology [phone] 05/19/2017, 2:59 PM

## 2017-05-19 NOTE — Evaluation (Signed)
Occupational Therapy Evaluation Patient Details Name: ESABELLA STOCKINGER MRN: [id number] DOB: 07-01-1991 Today's Date: 05/19/2017    History of Present Illness Merle Whitehorn is a 26 y.o. female who has a hx of allergies, depression, eczema, GERD, irritable bowel syndrome, OCD, polycystic ovaries who is 11w3dpregnant presents to the emergency department with a 20 minute history of numbness and tingling to the right side of her face associated with numbness and weakness of the left arm. Patient stated that she was driving when she felt confused, and "not right". She reports visual disturbances, thought cars were driving on her side of the road.  She drove home where her mother met her on her way to the hospital noted the paresthesias which started as right sided lower facial numbness.  On her arrival at the hospital she noticed left arm heaviness and weakness.  Patient denies fevers/chills, dizziness, speech disturbance, chest pain, shortness of breath, N/V/C/D, abdominal pain, dysuria/frequency.  MRI and carotid [place]are negative.    Clinical Impression   Pt is 26year old female who lives at home with her husband and 2 children in a one story home with 3 steps to enter.  She is 3153/[redacted] weeks pregnant with a baby girl.  Her symptoms of numbness and tingling in her face along jaw line on both sides and in her LUE and hand have resolved and sensation is intact.  Vision screening was normal and strength is 5/5 BUEs and hands.  No dizziness noted during session even when transitioning from supine to sitting to ambulating to bathroom.  She is at baseline for ADLs and seen for OT evaluation only.  No further OT needs.      Follow Up Recommendations  No OT follow up    Equipment Recommendations       Recommendations for Other Services       Precautions / Restrictions Precautions Precautions: None      Mobility Bed Mobility                  Transfers                      Balance                                            ADL either performed or assessed with clinical judgement   ADL Overall ADL's : At baseline                                       General ADL Comments: Pt is able to complete bed mobility, transfers and ambulate to bathroom with supervision only with no signs of decreased balance or dizziness.      Vision Baseline Vision/History: No visual deficits Patient Visual Report: No change from baseline Vision Assessment?: Yes Eye Alignment: Within Functional Limits Ocular Range of Motion: Within Functional Limits Alignment/Gaze Preference: Within Defined Limits Tracking/Visual Pursuits: Able to track stimulus in all quads without difficulty Saccades: Within functional limits Convergence: Within functional limits Visual Fields: No apparent deficits     Perception     Praxis      Pertinent Vitals/Pain Pain Assessment: No/denies pain     Hand Dominance Right   Extremity/Trunk Assessment Upper Extremity Assessment Upper Extremity Assessment: Overall  WFL for tasks assessed   Lower Extremity Assessment Lower Extremity Assessment: Defer to PT evaluation       Communication Communication Communication: No difficulties   Cognition Arousal/Alertness: Awake/alert Behavior During Therapy: WFL for tasks assessed/performed Overall Cognitive Status: Within Functional Limits for tasks assessed                                     General Comments       Exercises     Shoulder Instructions      Home Living Family/patient expects to be discharged to:: Private residence Living Arrangements: Spouse/significant other;Children Available Help at Discharge: Family Type of Home: House Home Access: Stairs to enter Technical brewer of Steps: 3 Entrance Stairs-Rails: None Home Layout: One level     Bathroom Shower/Tub: Teacher, early years/pre: Standard Bathroom Accessibility:  No   Home Equipment: None          Prior Functioning/Environment          Comments: Pt was ambulating without any AD and independent in all ADLs and driving.  She has 2 sons who are 78 years old and  55 years old.  Her 26 year old has aortic stenosis and has a planned heart surgery.           OT Problem List:        OT Treatment/Interventions:      OT Goals(Current goals can be found in the care plan section) Acute Rehab OT Goals Patient Stated Goal: "to go home' OT Goal Formulation: With patient/family  OT Frequency:     Barriers to D/C:            Co-evaluation              AM-PAC PT "6 Clicks" Daily Activity     Outcome Measure Help from another person eating meals?: None Help from another person taking care of personal grooming?: None Help from another person toileting, which includes using toliet, bedpan, or urinal?: None Help from another person bathing (including washing, rinsing, drying)?: None Help from another person to put on and taking off regular upper body clothing?: None Help from another person to put on and taking off regular lower body clothing?: None 6 Click Score: 24   End of Session Nurse Communication: Other (comment) (pt ambulated to bathroom and urinated during session)  Activity Tolerance: Patient tolerated treatment well Patient left: in bed;with call bell/phone within reach;with family/visitor present;Other (comment) (Dr Posey Pronto present to see patient as well as pts mother and 2 kids )  OT Visit Diagnosis: Other symptoms and signs involving the nervous system (R29.898)                Time: 1330-1400 OT Time Calculation (min): 30 min Charges:  OT General Charges $OT Visit: 1 Procedure OT Evaluation $OT Eval Low Complexity: 1 Procedure OT Treatments $Self Care/Home Management : 8-22 mins G-Codes:      Chrys Racer, OTR/L 05/19/17, 2:19 PM

## 2017-05-19 NOTE — Discharge Planning (Signed)
Instructions given on stroke signs and symptoms and returning to ED if experiencing any further symptoms.  Instructions and education given on calling or returning to birthplace for:  Strong, regular contractions Leaking fluid Decreased fetal movement Vaginal bleeding that is heavy like a period  Patient acknowledged instructions.  Discharge paperwork signed and patient left via wheelchair.

## 2017-05-19 NOTE — Progress Notes (Signed)
PT Cancellation Note  Patient Details Name: Romilda JoyKristin L Rendleman MRN: [id number] DOB: 01/12/1991   Cancelled Treatment:    Reason Eval/Treat Not Completed: Other (comment): Order received and chart reviewed. PT performed screen this PM. No focal neurological deficits found and pt independent with all bed mobility, transfers and ambulation. Pt able to ambulate without an AD and has returned to PLOF. PT planning to dc within house at this time. Please contact if new need arises.    Mabeline CarasMichelle C Heavyn Yearsley, PT, SPT  McFarlanMichelle Kate Sweetman 05/19/2017, 2:26 PM

## 2017-05-19 NOTE — Consult Note (Addendum)
[place] @ [facility] Admission History and Physical [employer], D.O.  ---------------------------------------------------------------------------------------------------------------------   PATIENT NAME: Lisa House MR#: [id number] DATE OF BIRTH: 01-Aug-1991 DATE OF ADMISSION: 05/18/2017 PRIMARY CARE PHYSICIAN: Steele Sizer, MD  REQUESTING/REFERRING PHYSICIAN: Dalia Heading, [name]  CHIEF COMPLAINT: Chief Complaint  Patient presents with  . Weakness   HISTORY OF PRESENT ILLNESS: Lisa House is a 26 y.o. female who is allergies, depression, eczema, GERD, irritable bowel syndrome, OCD, polycystic ovaries who is 17w3dpregnant presents to the emergency department with a 20 minute history of numbness and tingling to the right side of her face associated with numbness and weakness of the left arm. Patient stated that she was driving when she felt confused, and "not right". She reports visual disturbances, thought cars were driving on her side of the road.  She drove home where her mother met her on her way to the hospital noted the paresthesias which started as right sided lower facial numbness.  On her arrival at the hospital she noticed left arm heaviness and weakness.  Patient denies fevers/chills, dizziness, speech disturbance, chest pain, shortness of breath, N/V/C/D, abdominal pain, dysuria/frequency.   Of note, her s28year old son has aortic stenosis for which he has had an angioplasty and a planned valve replacement.  Otherwise there has been no change in status. Patient has been taking medication as prescribed and there has been no recent change in medication or diet.  There has been no recent illness, travel or sick contacts.  She is not taking aspirin at this time.  .  EMS/ED COURSE:   Patient was admitted to the OB/GYN service and medical consult was requested for further management of paresthesias. She did have an MRI of the brain in the ER which was  negative.    PAST MEDICAL HISTORY: Past Medical History:  Diagnosis Date  . Allergy   . Depression   . Eczema   . GERD (gastroesophageal reflux disease)   . Intertrigo   . Irritable bowel syndrome   . OCD (obsessive compulsive disorder)   . Panic attack   . Polycystic ovarian disease   . Skin lesion       PAST SURGICAL HISTORY: Past Surgical History:  Procedure Laterality Date  . CHOLECYSTECTOMY  01/09/2011   Dr. CBurt Knack     SOCIAL HISTORY: Social History  Substance Use Topics  . Smoking status: Never Smoker  . Smokeless tobacco: Never Used  . Alcohol use No      FAMILY HISTORY: Family History  Problem Relation Age of Onset  . Hypertension Mother   . Diabetes Mother   . Headache Mother   . Hypertension Father   . Lung cancer Father   . Heart disease Son        Aortic Valve Disorder Stenosis-repaired  Son, 7 s/p balloon angioplasty.  Valve replacement planned.    MEDICATIONS AT HOME: Prior to Admission medications   Medication Sig Start Date End Date Taking? Authorizing Provider  Prenatal-DSS-FeCb-FeGl-FA (CITRANATAL BLOOM) 90-1 MG TABS Take 1 tablet by mouth daily. 04/30/17  Yes GDalia Heading [name]  valACYclovir (VALTREX) 500 MG tablet Take 1 tablet (500 mg total) by mouth 2 (two) times daily. 04/27/17 05/27/17 Yes JWill Bonnet MD  Prenat-FeFum-FePo-FA-Omega 3 (CONCEPT DHA) 53.5-38-1 MG CAPS Take 1 tablet by mouth daily. Patient not taking: Reported on 05/14/2017 09/22/16   Schaevitz, DRandall An MD      DRUG ALLERGIES: No Known Allergies   REVIEW OF SYSTEMS:  CONSTITUTIONAL: No fever/chills, fatigue, weakness, weight gain/loss, headache EYES: Positive visual changes per HPI. No blurry or double vision. ENT: No tinnitus, postnasal drip, redness or soreness of the oropharynx. RESPIRATORY: No cough, wheeze, hemoptysis, dyspnea. CARDIOVASCULAR: No chest pain, orthopnea, palpitations, syncope. GASTROINTESTINAL: No nausea, vomiting,  constipation, diarrhea, abdominal pain, hematemesis, melena or hematochezia. GENITOURINARY: No dysuria or hematuria. ENDOCRINE: No polyuria or nocturia. No heat or cold intolerance. HEMATOLOGY: No anemia, bruising, bleeding. INTEGUMENTARY: No rashes, ulcers, lesions. MUSCULOSKELETAL: No arthritis, swelling, gout. NEUROLOGIC: Positive numbness, tingling, weakness as per history of present illness. No seizure-type activity. PSYCHIATRIC: No anxiety, depression, insomnia.  PHYSICAL EXAMINATION: VITAL SIGNS: Blood pressure (!) 116/93, pulse 85, temperature 99.3 F (37.4 C), resp. rate 17, height _0  (1.651 m), weight 113.4 kg (250 lb), last menstrual period 08/21/2016, SpO2 98 %.  GENERAL: 26 y.o.-year-old female patient, well-developed, well-nourished lying in the bed in no acute distress.  Pleasant and cooperative.   HEENT: Head atraumatic, normocephalic. Pupils equal, round, reactive to light and accommodation. No scleral icterus. Extraocular muscles intact. Nares are patent. Oropharynx is clear. Mucus membranes moist. NECK: Supple, full range of motion. Positive bilateral bruit,  No JVD. No thyroid enlargement, no tenderness, no cervical lymphadenopathy. CHEST: Normal breath sounds bilaterally. No wheezing, rales, rhonchi or crackles. No use of accessory muscles of respiration.  No reproducible chest wall tenderness.  CARDIOVASCULAR: S1, S2 normal. Holosystolic ejection murmur heard best at LSB, radiates to carotids bilaterally.  Cap refill <2 seconds. ABDOMEN: Soft, nontender, nondistended. Gravid uterus EXTREMITIES: Full range of motion. No pedal edema, cyanosis, or clubbing. NEUROLOGIC: Cranial nerves II through XII are grossly intact with no focal sensorimotor deficit. Muscle strength 5/5 in all extremities. Sensation intact. Cerebellar signs normal. Gait not checked. PSYCHIATRIC: The patient is alert and oriented x 3. Normal affect, mood, thought content. SKIN: Warm, dry, and intact  without obvious rash, lesion, or ulcer.  LABORATORY PANEL:  CBC  Recent Labs Lab 05/18/17 2051  WBC 9.1  HGB 8.8*  HCT 26.5*  PLT 169   ----------------------------------------------------------------------------------------------------------------- Chemistries  Recent Labs Lab 05/18/17 2051  NA 136  K 3.7  CL 105  CO2 24  GLUCOSE 103*  BUN 9  CREATININE 0.72  CALCIUM 8.6*   ------------------------------------------------------------------------------------------------------------------ Cardiac Enzymes No results for input(s): TROPONINI in the last 168 hours. ------------------------------------------------------------------------------------------------------------------  RADIOLOGY: Mr Brain Wo Contrast (neuro Protocol)  Result Date: 05/18/2017 CLINICAL DATA:  Acute onset facial numbness, bilateral upper extremity weakness. Thirty-nine weeks pregnant. EXAM: MRI HEAD WITHOUT CONTRAST TECHNIQUE: Multiplanar, multiecho pulse sequences of the brain and surrounding structures were obtained without intravenous contrast. COMPARISON:  CT HEAD May 06, 2010 FINDINGS: BRAIN: No reduced diffusion to suggest acute ischemia. No susceptibility artifact to suggest hemorrhage. The ventricles and sulci are normal for patient's age. No suspicious parenchymal signal, mass or mass effect. No abnormal extra-axial fluid collections. VASCULAR: Normal major intracranial vascular flow voids present at skull base. No abnormal signal along the course of the superior sagittal sinus. SKULL AND UPPER CERVICAL SPINE: No abnormal sellar expansion. No suspicious calvarial bone marrow signal. Craniocervical junction maintained. SINUSES/ORBITS: Mild paranasal sinus mucosal thickening without air-fluid levels. Mastoid air cells are well aerated. The included ocular globes and orbital contents are non-suspicious. OTHER: None. IMPRESSION: Normal noncontrast MRI head. Electronically Signed   By: Elon Alas  M.D.   On: 05/18/2017 22:31    EKG: Normal sinus rhythm at 80 bpm with normal axis, minimal criteria for LVH. and nonspecific ST and T wave changes  IMPRESSION AND PLAN:  This is a 26 y.o. female with a history of allergies, depression, eczema, GERD, irritable bowel syndrome, OCD, polycystic ovaries is currently 39 weeks and 3 days pregnant now being admitted with:  #. Transient paresthesias ? Aortic stenosis.  - Patient is admitted to the [facility] service where she will undergo a fetal nonstress test. We will plan for: - Telemetry monitoring - Studies: Echo, Carotids - Labs: CBC, BMP, Lipids, TFTs, A1C, troponins - Nursing: Neurochecks, O2, dysphagia screen. - Consults: Neurology, Cardiology, PT/OT, S/S consults.  - Meds: Daily aspirin 62m.   - Fluids: IVNS_0 /hr.   - Routine DVT Px: SCDs, early ambulation  #. 39 weeks 3 days pregnant -Expectant management per OB team.  Code Status: Full  All the records are reviewed and case discussed with ED provider. Management plans discussed with the patient and her mother who express understanding and agree with plan of care.   Paeton Latouche D.O. on 05/19/2017 at 12:44 AM Between 7am to 6pm - Pager - [phone] After 6pm go to [email or web address] - pMarketing executiveAlam[place] Hospitalists Office [phone]: Primary care physician; SSteele Sizer MD     Note: This dictation was prepared with Dragon dictation along with smaller phrase technology. Any transcriptional errors that result from this process are unintentional.

## 2017-05-19 NOTE — Progress Notes (Addendum)
[facility] - [place] at [facility]   PATIENT NAME: Lisa House    MR#:  [id number]  DATE OF BIRTH:  1991/06/16  SUBJECTIVE:  The patient came in after she had some tingling and numbness on her right cheek and right upper extremity after she was out in the hot weather yesterday getting her mother's car repaired at the mechanic shop. She is asymptomatic at present no further symptoms since being admitted  REVIEW OF SYSTEMS:   Review of Systems  Constitutional: Negative for chills, fever and weight loss.  HENT: Negative for ear discharge, ear pain and nosebleeds.   Eyes: Negative for blurred vision, pain and discharge.  Respiratory: Negative for sputum production, shortness of breath, wheezing and stridor.   Cardiovascular: Negative for chest pain, palpitations, orthopnea and PND.  Gastrointestinal: Negative for abdominal pain, diarrhea, nausea and vomiting.  Genitourinary: Negative for frequency and urgency.  Musculoskeletal: Negative for back pain and joint pain.  Neurological: Negative for sensory change, speech change, focal weakness and weakness.  Psychiatric/Behavioral: Negative for depression and hallucinations. The patient is not nervous/anxious.    Tolerating Diet: yes Tolerating PT: not needed  DRUG ALLERGIES:  No Known Allergies  VITALS:  Blood pressure (!) 127/52, pulse 72, temperature 97.8 F (36.6 C), temperature source Oral, resp. rate 16, height 5\' 6"  (1.676 m), weight 112.9 kg (249 lb), last menstrual period 08/16/2016, SpO2 100 %.  PHYSICAL EXAMINATION:   Physical Exam  GENERAL:  26 y.o.-year-old patient lying in the bed with no acute distress. obese EYES: Pupils equal, round, reactive to light and accommodation. No scleral icterus. Extraocular muscles intact.  HEENT: Head atraumatic, normocephalic. Oropharynx and nasopharynx clear.  NECK:  Supple, no jugular venous distention. No thyroid enlargement, no tenderness.  LUNGS: Normal  breath sounds bilaterally, no wheezing, rales, rhonchi. No use of accessory muscles of respiration.  CARDIOVASCULAR: S1, S2 normal. No murmurs, rubs, or gallops.  ABDOMEN: Soft, nontender, nondistended. Bowel sounds present. No organomegaly or mass. pregnant EXTREMITIES: No cyanosis, clubbing or edema b/l.    NEUROLOGIC: Cranial nerves II through XII are intact. No focal Motor or sensory deficits b/l.   PSYCHIATRIC:  patient is alert and oriented x 3.  SKIN: No obvious rash, lesion, or ulcer.   LABORATORY PANEL:  CBC  Recent Labs Lab 05/18/17 2051  WBC 9.1  HGB 8.8*  HCT 26.5*  PLT 169    Chemistries   Recent Labs Lab 05/18/17 2051  NA 136  K 3.7  CL 105  CO2 24  GLUCOSE 103*  BUN 9  CREATININE 0.72  CALCIUM 8.6*  AST 17  ALT 12*  ALKPHOS 126  BILITOT 0.4   Cardiac Enzymes No results for input(s): TROPONINI in the last 168 hours. RADIOLOGY:  Mr Brain Wo Contrast (neuro Protocol)  Result Date: 05/18/2017 CLINICAL DATA:  Acute onset facial numbness, bilateral upper extremity weakness. Thirty-nine weeks pregnant. EXAM: MRI HEAD WITHOUT CONTRAST TECHNIQUE: Multiplanar, multiecho pulse sequences of the brain and surrounding structures were obtained without intravenous contrast. COMPARISON:  CT HEAD May 06, 2010 FINDINGS: BRAIN: No reduced diffusion to suggest acute ischemia. No susceptibility artifact to suggest hemorrhage. The ventricles and sulci are normal for patient's age. No suspicious parenchymal signal, mass or mass effect. No abnormal extra-axial fluid collections. VASCULAR: Normal major intracranial vascular flow voids present at skull base. No abnormal signal along the course of the superior sagittal sinus. SKULL AND UPPER CERVICAL SPINE: No abnormal sellar expansion. No suspicious calvarial bone marrow signal.  Craniocervical junction maintained. SINUSES/ORBITS: Mild paranasal sinus mucosal thickening without air-fluid levels. Mastoid air cells are well aerated. The  included ocular globes and orbital contents are non-suspicious. OTHER: None. IMPRESSION: Normal noncontrast MRI head. Electronically Signed   By: Awilda Metroourtnay  Bloomer M.D.   On: 05/18/2017 22:31   [place] Carotid Bilateral  Result Date: 05/19/2017 CLINICAL DATA:  TIA symptoms, syncope, visual disturbance EXAM: BILATERAL CAROTID DUPLEX ULTRASOUND TECHNIQUE: Wallace CullensGray scale imaging, color Doppler and duplex ultrasound were performed of bilateral carotid and vertebral arteries in the neck. COMPARISON:  05/18/2017 FINDINGS: Criteria: Quantification of carotid stenosis is based on velocity parameters that correlate the residual internal carotid diameter with NASCET-based stenosis levels, using the diameter of the distal internal carotid lumen as the denominator for stenosis measurement. The following velocity measurements were obtained: RIGHT ICA:  79/26 cm/sec CCA:  132/21 cm/sec SYSTOLIC ICA/CCA RATIO:  0.6 DIASTOLIC ICA/CCA RATIO:  1.2 ECA:  135 cm/sec LEFT ICA:  89/21 cm/sec CCA:  143/26 cm/sec SYSTOLIC ICA/CCA RATIO:  0.6 DIASTOLIC ICA/CCA RATIO:  0.8 ECA:  98 cm/sec RIGHT CAROTID ARTERY: No significant carotid atherosclerosis. No hemodynamically significant right ICA stenosis, velocity elevation, or turbulent flow. Degree of narrowing less than 50%. RIGHT VERTEBRAL ARTERY:  Antegrade LEFT CAROTID ARTERY: No significant carotid atherosclerotic plaque formation. No hemodynamically significant left ICA stenosis, velocity elevation, or turbulent flow. LEFT VERTEBRAL ARTERY:  Antegrade IMPRESSION: No significant carotid atherosclerosis or ICA stenosis by ultrasound. Degree of narrowing less than 50% bilaterally by ultrasound criteria. Patent antegrade vertebral flow bilaterally Electronically Signed   By: Judie PetitM.  Shick M.D.   On: 05/19/2017 11:45   ASSESSMENT AND PLAN:  26 y.o. female with a history of allergies, depression, eczema, GERD, irritable bowel syndrome, OCD, polycystic ovaries is currently 39 weeks and 3 days pregnant  now being admitted with:  #. Transient paresthesias -resolved  - [facility]  - Echo wnl , Carotids no stenosis  - Meds: Daily aspirin 81mg .   - Routine DVT Px: SCDs, early ambulation MRI brain neg -neurology consult appreciated. Recommends MRV Head  #. 39 weeks 3 days pregnant -Expectant management per OB team.  Overall w/u for CVA negative.  Discharge planning once seen by Neurology  Case discussed with Care Management/Social Worker. Management plans discussed with the patient, family and they are in agreement.  CODE STATUS: full  DVT Prophylaxis: ambulation  TOTAL TIME TAKING CARE OF THIS PATIENT: 30 minutes.  >50% time spent on counselling and coordination of care pt and mother   Note: This dictation was prepared with Dragon dictation along with smaller phrase technology. Any transcriptional errors that result from this process are unintentional.  Jarreau Callanan M.D on 05/19/2017 at 2:38 PM  Between 7am to 6pm - Pager - [phone]  After 6pm go to [email or web address] - Social research officer, governmentpassword EPAS [facility]  [facility]  Office  [phone]  CC: Primary care physician; Alba CorySowles, Krichna, MD

## 2017-05-19 NOTE — Progress Notes (Signed)
Chaplain received page Code Stroke.  Chaplain went to visit with patient And her Mother. Nurse informed me That patient was going up for an MRI. I spoke with patient and her mother She said she was ok and would call Me if she needed me. Patient was a  pregnant female with stroke symptoms.

## 2017-05-19 NOTE — Discharge Summary (Signed)
Physician Discharge Summary  Patient ID: Lisa JoyKristin L Tobia MRN: [id number] DOB/AGE: [birth year] 25 y.o.  Admit date: 05/18/2017 Discharge date: 05/19/2017  Admission Diagnoses: Dizziness, [redacted] weeks pregnant, Weakness  Discharge Diagnoses:  Active Problems:   Left-sided weakness   Dizziness   Discharged Condition: good  Hospital Course: Admited with tests done for weakness, numbness to face, dizziness.  No OB sx's of labor, no s/sx preeclampsia.  Medicine and neurology consults to eval pt, no etiology determined.  Monitoring revealed no further sx's.  Consults: neurology and medicine  Significant Diagnostic Studies: MRI, US, Carotid study, labs all normal.  Treatments: IV hydration  Discharge Exam: Blood pressure 127/63, pulse 62, temperature 98 F (36.7 C), temperature source Oral, resp. rate 17, height 5\' 6"  (1.676 m), weight 249 lb (112.9 kg), last menstrual period 08/16/2016, SpO2 100 %. General appearance: alert, cooperative and no distress GI: soft, non-tender; bowel sounds normal; no masses,  no organomegaly, gravid Skin: Skin color, texture, turgor normal. No rashes or lesions or normal A NST procedure was performed with FHR monitoring and a normal baseline established, appropriate time of 20-40 minutes of evaluation, and accels >2 seen w 15x15 characteristics.  Results show a REACTIVE NST.   Disposition: 01-Home or Self Care   Allergies as of 05/19/2017   No Known Allergies     Medication List    TAKE these medications   CITRANATAL BLOOM 90-1 MG Tabs Take 1 tablet by mouth daily.   CONCEPT DHA 53.5-38-1 MG Caps Take 1 tablet by mouth daily.   valACYclovir 500 MG tablet Commonly known as:  VALTREX Take 1 tablet (500 mg total) by mouth 2 (two) times daily.      Follow-up Information    Alba CorySowles, Krichna, MD.   Specialty:  [facility] Contact information: [address] [place] [address] [phone]         OB Appt  Thurs  Signed: Letitia LibraRobert Paul Lachlan Pelto 05/19/2017, 7:57 PM

## 2017-05-19 NOTE — Progress Notes (Signed)
Speech Therapy Note: Reviewed chart notes; noted MRI results. Will await Neurology evaluation and assessment then f/u w/ pt this pm.  No gross speech deficits have been described, and she is verbally engaging w/ staff. Pt has had c/o facial tingling/numbness. No reports of difficulty swallowing have been reported; pt is currently on a regular diet.    Jerilynn SomKatherine Watson, MS, CCC-SLP

## 2017-05-19 NOTE — Progress Notes (Signed)
*  PRELIMINARY RESULTS* Echocardiogram 2D Echocardiogram has been performed.  Cristela BlueHege, Muneeb Veras 05/19/2017, 10:18 AM

## 2017-05-19 NOTE — H&P (Signed)
OB History & Physical   History of Present Illness:  Chief Complaint:   HPI:  Lisa House is a 26 y.o. G40P2002 female with EDC=05/23/2017 at 63w3ddated by LMP and confirmed with a 7wk2 day ultrasound.  Her pregnancy has been complicated by obesity with starting BMI=41kg/m2, first and early second trimester spotting,  and a history of  HSV 1. .  PNC also complicated by anemia and a history of her first son with aortic valve stenosis She also has a history of  allergies, depression, eczema, GERD, irritable bowel syndrome, OCD, and polycystic ovaries. She is admitted after she  presented to the emergency department with a 20 minute history of numbness and tingling to the right side of her face associated with numbness and weakness of the left arm. Patient stated that she was driving when she felt confused, and "not right". She reports visual disturbances, thought cars were driving on her side of the road.  She drove home where her mother met her on her way to the hospital noted the paresthesias which started as right sided lower facial numbness.  On her arrival at the hospital she noticed left arm heaviness and weakness. She also could not understand what her child was saying to her. Patient denies fevers/chills, dizziness, speech disturbance, chest pain, shortness of breath, N/V/C/D, abdominal pain, dysuria/frequency. An MRI of the head while in the ER was normal. Dr SNicole Kindred neurologist, was consulted in the ER and recommended admission for further evaluation and observation. Dr HAra Kussmaul medicine hospitalist, was also consulted and orders received.  Current medications include prenatal vitamins and Valtrex  [facility] Has not been on a baby ASA daily. Did have a recent growth scan 6/26 with EFW in the 68th% and with a normal AFI.      Maternal Medical History:   Past Medical History:  Diagnosis Date  . Allergy   . Depression   . Eczema   . GERD  (gastroesophageal reflux disease)   . Intertrigo   . Irritable bowel syndrome   . OCD (obsessive compulsive disorder)   . Panic attack   . Polycystic ovarian disease   . Skin lesion     Past Surgical History:  Procedure Laterality Date  . CHOLECYSTECTOMY  01/09/2011   Dr. CBurt Knack   No Known Allergies  Prior to Admission medications   Medication Sig Start Date End Date Taking? Authorizing Provider  Prenatal-DSS-FeCb-FeGl-FA (CITRANATAL BLOOM) 90-1 MG TABS Take 1 tablet by mouth daily. 04/30/17  Yes GDalia Heading [name]  valACYclovir (VALTREX) 500 MG tablet Take 1 tablet (500 mg total) by mouth 2 (two) times daily. 04/27/17 05/27/17 Yes JWill Bonnet MD  Prenat-FeFum-FePo-FA-Omega 3 (CONCEPT DHA) 53.5-38-1 MG CAPS Take 1 tablet by mouth daily. Patient not taking: Reported on 05/14/2017 09/22/16   Schaevitz, DRandall An MD          Social History: She  reports that she has never smoked. She has never used smokeless tobacco. She reports that she does not drink alcohol or use drugs.  Family History: family history includes Diabetes in her mother; Headache in her mother; Heart disease in her son; Hypertension in her father and mother; Lung cancer in her father.   Review of Systems: see HPI    Physical Exam:  Vital Signs: BP 134/75 (BP Location: Left Arm)   Pulse 86   Temp 97.7 F (36.5 C) (Oral)   Resp 18   Ht 5' 6"  (1.676 m)  Wt 112.9 kg (249 lb)   LMP 08/16/2016   SpO2 99%   BMI 40.19 kg/m  General: no acute distress.  HEENT: normocephalic, atraumatic Heart: regular rate & rhythm.  Systolic murmur best heard at the pulmonic area Grade 3/6 Lungs: clear to auscultation bilaterally Abdomen: soft, gravid, non-tender;  EFW: 8#-81/2#/vertex Extremities: non-tender, symmetric, trace edema bilaterally.  DTRs: +1. No evidence of DVT  Neurologic: Alert & oriented x 3.    Pertinent Results:  Prenatal Labs: Blood type/Rh O positive  Antibody screen negative   Rubella Varicella Immune nonimmune  RPR negative  HBsAg negative  HIV negative  GC negative  Chlamydia negative  Genetic screening First trimester test neg  1 hour GTT 82/91  3 hour GTT NA  GBS positive on 6/18   Baseline FHR: 125-130 with accelerations to 150s to 170, moderate variability Toco: essentially acontractile  Assessment:  AFTAN VINT is a 26 y.o. G104P2002 female at 26w3dwith transient paresthesias FWB: cat 1 tracing/ reactive NST  [facility]:  [facility]  2. Baby ASA daily 3. Medicine Consult done: Neurological and orders for studies (echo/ carotid Dopplers/ labs) [facility] Neurology consult in Am 4. Regular diet as tolerated  5. FHTs q shift 6. Vitmains/ iron and valtrex continued  CDalia Heading(Dr JGlennon Macconsulted on POM) 05/19/2017 3:59 AM

## 2017-05-20 ENCOUNTER — Other Ambulatory Visit: Payer: Self-pay | Admitting: Certified Nurse Midwife

## 2017-05-20 DIAGNOSIS — Z6841 Body Mass Index (BMI) 40.0 and over, adult: Principal | ICD-10-CM

## 2017-05-20 LAB — HEMOGLOBIN A1C
Hgb A1c MFr Bld: 5.1 % (ref 4.8–5.6)
MEAN PLASMA GLUCOSE: 100 mg/dL

## 2017-05-21 ENCOUNTER — Encounter: Payer: Self-pay | Admitting: Certified Nurse Midwife

## 2017-05-21 ENCOUNTER — Ambulatory Visit (INDEPENDENT_AMBULATORY_CARE_PROVIDER_SITE_OTHER): Payer: Medicaid Other

## 2017-05-21 ENCOUNTER — Ambulatory Visit (INDEPENDENT_AMBULATORY_CARE_PROVIDER_SITE_OTHER): Payer: Medicaid Other | Admitting: Certified Nurse Midwife

## 2017-05-21 VITALS — BP 108/62 | Wt 252.0 lb

## 2017-05-21 DIAGNOSIS — Z6841 Body Mass Index (BMI) 40.0 and over, adult: Secondary | ICD-10-CM | POA: Diagnosis not present

## 2017-05-21 DIAGNOSIS — O0990 Supervision of high risk pregnancy, unspecified, unspecified trimester: Secondary | ICD-10-CM

## 2017-05-21 DIAGNOSIS — Z3A39 39 weeks gestation of pregnancy: Secondary | ICD-10-CM

## 2017-05-21 LAB — FETAL NONSTRESS TEST

## 2017-05-21 NOTE — Progress Notes (Signed)
Hospitalized 7/9 and 7/10 for episode of paresthesia of face and weakness on left side of body and difficulty understanding speech. MRI and MRV both negative. Echo and carotid ultrasound were both normal. Possible etiology: atypical migraine Anemia on labs: hemoglobin 8.8 gm/dl and hematocrit 16.1%26.5%. Not taking vitamins or iron.  Continues on Valtrex NST reactive today with baseline 130 and accelrations to 160-180, moderate variability AFI=8.65cm/ vertex Cervix: 1.5 to 2 cm/ 50%/-3/anterior/ medium-Bishop score 5 IOL scheduled for 14 July. Probably with Cytotec

## 2017-05-21 NOTE — Progress Notes (Signed)
Pt c/o feeling tired. No ctx. Feeling pressure.

## 2017-05-21 NOTE — H&P (Signed)
OB History & Physical   History of Present Illness:  Chief Complaint:  "I am here to have my baby." HPI:  Lisa House is a 26 y.o. G53P2002 female with EDC=05/23/2017 at 40 weeks dated by LMP 08/16/2017 and c/w a 7wk2d ultrasound. She presents for an induction of labor.  Her high risk pregnancy has been complicated by obesity with starting BMI=41kg/m2 (net loss of 4 #, normal glucola x 2), first and early second trimester spotting,  and a history of  HSV 1. (Taking Valtrex PPX).  PNC also complicated by anemia (hemoglobin 8.8gm/dl on 7/9)  and a history of her first son with aortic valve stenosis. In addition, she was admitted 7/9 after presenting with cognitive and visual complaints along with LUE paresthesias. All the symptoms resolved within a few hours.   MRI of the brain, MRV, echo and carotid ultrasound were normal.  No evidence of infarct, hemorrhage or MS. The consulting neurologist thought the  presentation likely represented a migraine equivalent.  She also has a history of  allergies, depression, eczema, GERD, irritable bowel syndrome, OCD, and polycystic ovaries.   Growth scan on 05/05/2017 with EFW 64th% 3283 grams or 7#4oz. Non stress tests have been reactive and AFIs WNL.  Prenatal care site: [facility]  Dating By 7wk2d [place] Blood type: --/--/O POS (11/13 1749)   Genetic Screen 1 Screen: neg   AFP: neg    Quad:     NIPS: Antibody:Negative (12/08 0000)  Anatomic [place] complete Rubella: Immune (12/08 0000)  GTT Early:      82         Third trimester: 91 RPR: Non Reactive (04/20 0902)   Flu vaccine  HBsAg: Negative (12/08 0000)   TDaP vaccine    03/31/2017                                      Rhogam: NA HIV: Non Reactive (04/20 0902)   Baby Food                                               [id number] (06/18 1444)  Contraception  Pap: NIL  Circumcision  Varicella non immune  Pediatrician     Iron low 28 wks, not taking  iron as prescribed       Maternal Medical History:   Past Medical History:  Diagnosis Date  . Allergy   . Depression   . Eczema   . GERD (gastroesophageal reflux disease)   . Intertrigo   . Irritable bowel syndrome   . OCD (obsessive compulsive disorder)   . Panic attack   . Polycystic ovarian disease   . Skin lesion     Past Surgical History:  Procedure Laterality Date  . CHOLECYSTECTOMY  01/09/2011   Dr. Excell Seltzer   OB History  Gravida Para Term Preterm AB Living  3 2 2     2   SAB TAB Ectopic Multiple Live Births          2    # Outcome Date GA Lbr Len/2nd Weight Sex Delivery Anes PTL Lv  3 Current           2 Term 11/20/14 [redacted]w[redacted]d  3.289 kg (7 lb 4 oz) M  Vag-Spont   LIV  1 Term 02/20/10 2947w0d  4.054 kg (8 lb 15 oz) M Vag-Spont   LIV     Complications: Low amniotic fluid    Obstetric Comments  Both labors were induced     No Known Allergies  Prior to Admission medications   Medication Sig Start Date End Date Taking? Authorizing Provider         valACYclovir (VALTREX) 500 MG tablet Take 1 tablet (500 mg total) by mouth 2 (two) times daily. 04/27/17 05/27/17 Yes Conard NovakJackson, Stephen D, MD         Given samples of Ferralet on 7.12.2018 Starting baby ASA 81 mgm daily 05/21/2017        Social History: She  reports that she has never smoked. She has never used smokeless tobacco. She reports that she does not drink alcohol or use drugs.  Family History: family history includes Diabetes in her mother; Headache in her mother; Heart disease in her son; Hypertension in her father and mother; Lung cancer in her father.   Review of Systems: Negative x 10 systems reviewed except as noted in the HPI.      Physical Exam:  Vital Signs: BP 108/62   Wt 114.3 kg (252 lb)   LMP 08/16/2016   BMI 40.67 kg/m  General: no acute distress.  HEENT: normocephalic, atraumatic Lungs: clear to auscultation bilaterally Heart: RRR with systolic murmur best heard at pulmonic area Grade  3/6 Abdomen: soft, gravid, non-tender;  EFW: 8 1/2# Pelvic:   External: Normal external female genitalia  Cervix: Dilation: 2 / Effacement (%): 50 / Station: -3   Extremities: non-tender, symmetric, trace edema bilaterally.  DTRs: +1  Neurologic: Alert & oriented x 3.        Baseline FHR: Toco:   Assessment:  Lisa House is a 26 y.o. 493P2002 female at 40 weeks for IOL Bishop Score:5 FWB:  Plan:  1. Admit to Labor & Delivery   2. CBC, T&S, Clrs, IVF 3. GBS positive-PCN PPX.   4. Consents obtained. 5. Discussed induction process with patient (two prior labors induced). Explained use of Cytotec, Pitocin and foley bulbs in the induction process. Will start induction with Cytotec. Patient explained risks of hyperstimulation, fetal intolerance and Cesarean section. 6. Pain management:  7. O POS/ RI/ VNI-vaccinate for varicella prior to discharge 8. TDAP given 03/31/2017  Farrel ConnersColleen Stoy Fenn  05/21/2017 12:03 PM

## 2017-05-21 NOTE — Addendum Note (Signed)
Addended by: [name], Fallen Crisostomo on: 05/21/2017 12:42 PM   Modules accepted: Orders, SmartSet

## 2017-05-23 ENCOUNTER — Inpatient Hospital Stay
Admission: EM | Admit: 2017-05-23 | Discharge: 2017-05-26 | DRG: 775 | Disposition: A | Discharge status: Home / Self Care | Payer: Medicaid Other | Attending: Obstetrics and Gynecology | Admitting: Obstetrics and Gynecology

## 2017-05-23 ENCOUNTER — Inpatient Hospital Stay: Payer: Medicaid Other | Admitting: Anesthesiology

## 2017-05-23 ENCOUNTER — Encounter: Payer: Self-pay | Admitting: *Deleted

## 2017-05-23 DIAGNOSIS — Z6841 Body Mass Index (BMI) 40.0 and over, adult: Secondary | ICD-10-CM

## 2017-05-23 DIAGNOSIS — O9902 Anemia complicating childbirth: Secondary | ICD-10-CM | POA: Diagnosis present

## 2017-05-23 DIAGNOSIS — E669 Obesity, unspecified: Secondary | ICD-10-CM | POA: Diagnosis present

## 2017-05-23 DIAGNOSIS — Z3A40 40 weeks gestation of pregnancy: Secondary | ICD-10-CM

## 2017-05-23 DIAGNOSIS — D509 Iron deficiency anemia, unspecified: Secondary | ICD-10-CM | POA: Diagnosis present

## 2017-05-23 DIAGNOSIS — Z3493 Encounter for supervision of normal pregnancy, unspecified, third trimester: Secondary | ICD-10-CM | POA: Diagnosis present

## 2017-05-23 DIAGNOSIS — O99214 Obesity complicating childbirth: Secondary | ICD-10-CM | POA: Diagnosis present

## 2017-05-23 LAB — TYPE AND SCREEN
ABO/RH(D): O POS
Antibody Screen: NEGATIVE

## 2017-05-23 LAB — CBC
HEMATOCRIT: 26.1 % — AB (ref 35.0–47.0)
Hemoglobin: 9 g/dL — ABNORMAL LOW (ref 12.0–16.0)
MCH: 23.8 pg — AB (ref 26.0–34.0)
MCHC: 34.3 g/dL (ref 32.0–36.0)
MCV: 69.4 fL — AB (ref 80.0–100.0)
Platelets: 172 10*3/uL (ref 150–440)
RBC: 3.76 MIL/uL — ABNORMAL LOW (ref 3.80–5.20)
RDW: 16 % — AB (ref 11.5–14.5)
WBC: 8.6 10*3/uL (ref 3.6–11.0)

## 2017-05-23 MED ORDER — MISOPROSTOL 25 MCG QUARTER TABLET
25.0000 ug | ORAL_TABLET | ORAL | Status: DC | PRN
  Administered 2017-05-23 (×2): 25 ug via VAGINAL
  Filled 2017-05-23 (×2): qty 1

## 2017-05-23 MED ORDER — LIDOCAINE HCL (PF) 1 % IJ SOLN
30.0000 mL | INTRAMUSCULAR | Status: DC | PRN
  Filled 2017-05-23: qty 30

## 2017-05-23 MED ORDER — FENTANYL 2.5 MCG/ML W/ROPIVACAINE 0.15% IN NS 100 ML EPIDURAL (ARMC)
EPIDURAL | Status: AC
  Filled 2017-05-23: qty 100

## 2017-05-23 MED ORDER — OXYTOCIN 40 UNITS IN LACTATED RINGERS INFUSION - SIMPLE MED
2.5000 [IU]/h | INTRAVENOUS | Status: DC
  Filled 2017-05-23: qty 1000

## 2017-05-23 MED ORDER — LACTATED RINGERS IV SOLN
INTRAVENOUS | Status: DC
  Administered 2017-05-23 (×3): via INTRAVENOUS

## 2017-05-23 MED ORDER — PENICILLIN G POT IN DEXTROSE 60000 UNIT/ML IV SOLN
3.0000 10*6.[IU] | INTRAVENOUS | Status: DC
  Filled 2017-05-23 (×4): qty 50

## 2017-05-23 MED ORDER — MISOPROSTOL 200 MCG PO TABS
ORAL_TABLET | ORAL | Status: AC
  Administered 2017-05-23: 25 ug via VAGINAL
  Filled 2017-05-23: qty 4

## 2017-05-23 MED ORDER — TERBUTALINE SULFATE 1 MG/ML IJ SOLN: 0.2500 mg | Freq: Once | INTRAMUSCULAR | Status: DC | PRN

## 2017-05-23 MED ORDER — OXYTOCIN 10 UNIT/ML IJ SOLN
INTRAMUSCULAR | Status: AC
  Filled 2017-05-23: qty 2

## 2017-05-23 MED ORDER — SODIUM CHLORIDE 0.9 % IJ SOLN
INTRAMUSCULAR | Status: AC
  Filled 2017-05-23: qty 50

## 2017-05-23 MED ORDER — LACTATED RINGERS IV SOLN: 500.0000 mL | INTRAVENOUS | Status: DC | PRN

## 2017-05-23 MED ORDER — OXYTOCIN BOLUS FROM INFUSION
500.0000 mL | Freq: Once | INTRAVENOUS | Status: AC
  Administered 2017-05-24: 500 mL via INTRAVENOUS

## 2017-05-23 MED ORDER — BUPIVACAINE HCL (PF) 0.25 % IJ SOLN
INTRAMUSCULAR | Status: DC | PRN
Start: 2017-05-23 — End: 2017-05-24
  Administered 2017-05-23 (×2): 5 mL via EPIDURAL

## 2017-05-23 MED ORDER — FENTANYL 2.5 MCG/ML W/ROPIVACAINE 0.15% IN NS 100 ML EPIDURAL (ARMC)
EPIDURAL | Status: DC | PRN
  Administered 2017-05-23: 12 mL/h via EPIDURAL

## 2017-05-23 MED ORDER — OXYTOCIN 40 UNITS IN LACTATED RINGERS INFUSION - SIMPLE MED
1.0000 m[IU]/min | INTRAVENOUS | Status: DC
  Administered 2017-05-23: 2 m[IU]/min via INTRAVENOUS

## 2017-05-23 MED ORDER — PENICILLIN G POTASSIUM 5000000 UNITS IJ SOLR
5.0000 10*6.[IU] | Freq: Once | INTRAVENOUS | Status: AC
  Administered 2017-05-23: 5 10*6.[IU] via INTRAVENOUS
  Filled 2017-05-23: qty 5

## 2017-05-23 MED ORDER — LIDOCAINE-EPINEPHRINE (PF) 1.5 %-1:200000 IJ SOLN
INTRAMUSCULAR | Status: DC | PRN
Start: 2017-05-23 — End: 2017-05-24
  Administered 2017-05-23: 3 mL via EPIDURAL
  Administered 2017-05-23: 2 mL via PERINEURAL

## 2017-05-23 MED ORDER — AMMONIA AROMATIC IN INHA
RESPIRATORY_TRACT | Status: AC
  Filled 2017-05-23: qty 10

## 2017-05-23 MED ORDER — PENICILLIN G POTASSIUM 5000000 UNITS IJ SOLR
3.0000 10*6.[IU] | INTRAVENOUS | Status: DC
  Administered 2017-05-23 – 2017-05-24 (×4): 3 10*6.[IU] via INTRAVENOUS
  Filled 2017-05-23 (×10): qty 3

## 2017-05-23 MED ORDER — ONDANSETRON HCL 4 MG/2ML IJ SOLN: 4.0000 mg | Freq: Four times a day (QID) | INTRAMUSCULAR | Status: DC | PRN

## 2017-05-23 NOTE — Anesthesia Preprocedure Evaluation (Signed)
Anesthesia Evaluation  Patient identified by MRN, date of birth, ID band Patient awake    History of Anesthesia Complications Negative for: history of anesthetic complications  Airway Mallampati: II  TM Distance: >3 FB Neck ROM: Full    Dental no notable dental hx.    Pulmonary neg pulmonary ROS,           Cardiovascular Exercise Tolerance: Good   Murmur (recently told)   Neuro/Psych PSYCHIATRIC DISORDERS (OCD) negative neurological ROS     GI/Hepatic Neg liver ROS, GERD  ,  Endo/Other  negative endocrine ROS  Renal/GU negative Renal ROS  negative genitourinary   Musculoskeletal negative musculoskeletal ROS (+)   Abdominal   Peds negative pediatric ROS (+)  Hematology negative hematology ROS (+)   Anesthesia Other Findings   Reproductive/Obstetrics (+) Pregnancy                             Anesthesia Physical Anesthesia Plan  ASA: II  Anesthesia Plan: Epidural   Post-op Pain Management:    Induction:   PONV Risk Score and Plan:   Airway Management Planned:   Additional Equipment:   Intra-op Plan:   Post-operative Plan:   Informed Consent: I have reviewed the patients History and Physical, chart, labs and discussed the procedure including the risks, benefits and alternatives for the proposed anesthesia with the patient or authorized representative who has indicated his/her understanding and acceptance.     Plan Discussed with: Anesthesiologist  Anesthesia Plan Comments:         Anesthesia Quick Evaluation

## 2017-05-23 NOTE — Progress Notes (Signed)
Cordless EFM battery died. EFM switched out at this time.

## 2017-05-23 NOTE — Progress Notes (Signed)
Subjective:  Comfortable epidural in place  Objective:   Vitals: Blood pressure 121/65, pulse 80, temperature 98.2 F (36.8 C), temperature source Oral, resp. rate 18, height 5\' 6"  (1.676 m), weight 248 lb (112.5 kg), last menstrual period 08/16/2016. General: NAD Abdomen:gravid, non-tender Cervical Exam:  Dilation: (P) 4 Effacement (%): (P) 50 Station: -2 Presentation: (P) Vertex Exam by:: (P) Dr Bonney AidStaebler AROM clear  FHT: 120, moderate, +accels, 1 minute decel with AROM Toco:q352min  Results for orders placed or performed during the hospital encounter of 05/23/17 (from the past 24 hour(s))  CBC     Status: Abnormal   Collection Time: 05/23/17  8:48 AM  Result Value Ref Range   WBC 8.6 3.6 - 11.0 K/uL   RBC 3.76 (L) 3.80 - 5.20 MIL/uL   Hemoglobin 9.0 (L) 12.0 - 16.0 g/dL   HCT 04.526.1 (L) 40.935.0 - 81.147.0 %   MCV 69.4 (L) 80.0 - 100.0 fL   MCH 23.8 (L) 26.0 - 34.0 pg   MCHC 34.3 32.0 - 36.0 g/dL   RDW 91.416.0 (H) 78.211.5 - 95.614.5 %   Platelets 172 150 - 440 K/uL  Type and screen     Status: None   Collection Time: 05/23/17  8:48 AM  Result Value Ref Range   ABO/RH(D) O POS    Antibody Screen NEG    Sample Expiration 05/26/2017     Assessment:   26 y.o. O1H0865G3P2002 6487w0d IOL obesity  Plan:   1) Labor - continue pitocin  2) Fetus - cat I tracing

## 2017-05-23 NOTE — Progress Notes (Signed)
Cordless montiors switched out for EFM. TOCO and EFM readjusted.

## 2017-05-23 NOTE — H&P (Signed)
Date of Initial H&P: 05/21/17  History reviewed 26 yo G3P2002 at 6738w0d, patient examined, no change in status, stable to proceed with induction  [facility]  Dating By 7wk2d US Blood type: --/--/O POS (11/13 1749)   Genetic Screen 1 Screen: neg   AFP: neg    Antibody:Negative (12/08 0000)  Anatomic US complete Rubella: Immune (12/08 0000)  GTT Early:      82         Third trimester: 91 RPR: Non Reactive (04/20 0902)   Flu vaccine  HBsAg: Negative (12/08 0000)   TDaP vaccine  03/31/2017                  Rhogam: NA HIV: Non Reactive (04/20 0902)   Baby Food  Breast                        [id number]:Positive (06/18 1444)  Contraception  Progesterone Pap: NIL   Iron low 28 wks, ensure iron tx Varicella non immune

## 2017-05-23 NOTE — Progress Notes (Signed)
Dr Kephart notified of pt request for epidural. 

## 2017-05-23 NOTE — Progress Notes (Signed)
Subjective:  Doing well no concerns  Objective:   Vitals: Blood pressure 125/62, pulse 66, temperature 97.8 F (36.6 C), temperature source Oral, resp. rate 16, height 5\' 6"  (1.676 m), weight 248 lb (112.5 kg), last menstrual period 08/16/2016. General: NAD Abdomen: gravid, non-tender Cervical Exam:  Dilation: 3 Effacement (%): 50 Station: -2, -3 Exam by:: dr Bonney Aidstaebler Speculum exam no lesions Cervical foley catheter placed  FHT: 120, moderate, +accels, no decels Toco:q3-868min irregular  Results for orders placed or performed during the hospital encounter of 05/23/17 (from the past 24 hour(s))  CBC     Status: Abnormal   Collection Time: 05/23/17  8:48 AM  Result Value Ref Range   WBC 8.6 3.6 - 11.0 K/uL   RBC 3.76 (L) 3.80 - 5.20 MIL/uL   Hemoglobin 9.0 (L) 12.0 - 16.0 g/dL   HCT 98.126.1 (L) 19.135.0 - 47.847.0 %   MCV 69.4 (L) 80.0 - 100.0 fL   MCH 23.8 (L) 26.0 - 34.0 pg   MCHC 34.3 32.0 - 36.0 g/dL   RDW 29.516.0 (H) 62.111.5 - 30.814.5 %   Platelets 172 150 - 440 K/uL  Type and screen     Status: None   Collection Time: 05/23/17  8:48 AM  Result Value Ref Range   ABO/RH(D) O POS    Antibody Screen NEG    Sample Expiration 05/26/2017     Assessment:   26 y.o. M5H8469G3P2002 360w0d IOL  Plan:   1) Labor - cervical foley catheter, start pitocin  2) Fetus - cat I tracing

## 2017-05-23 NOTE — Anesthesia Procedure Notes (Signed)
Epidural Patient location during procedure: OB Start time: 05/23/2017 10:20 PM End time: 05/23/2017 10:50 PM  Staffing Resident/CRNA: Clovis FredricksonRISSON, Eliazer Hemphill Performed: resident/CRNA   Preanesthetic Checklist Completed: patient identified, site marked, surgical consent, pre-op evaluation, timeout performed, IV checked, risks and benefits discussed and monitors and equipment checked  Epidural Patient position: sitting Prep: ChloraPrep Patient monitoring: heart rate, continuous pulse ox and blood pressure Approach: midline Location: L3-L4 Injection technique: LOR saline  Needle:  Needle type: Tuohy  Needle gauge: 18 G Needle length: 9 cm Needle insertion depth: 9 cm Catheter type: closed end flexible Catheter size: 19 Gauge Catheter at skin depth: 13 cm Test dose: negative and 1.5% lidocaine with Epi 1:200 K  Assessment Sensory level: T10 Events: blood not aspirated, injection not painful, no injection resistance, negative IV test and no paresthesia  Additional Notes Reason for block:procedure for pain

## 2017-05-24 ENCOUNTER — Encounter: Payer: Self-pay | Admitting: *Deleted

## 2017-05-24 DIAGNOSIS — D509 Iron deficiency anemia, unspecified: Secondary | ICD-10-CM

## 2017-05-24 DIAGNOSIS — O9902 Anemia complicating childbirth: Secondary | ICD-10-CM

## 2017-05-24 DIAGNOSIS — Z3A40 40 weeks gestation of pregnancy: Secondary | ICD-10-CM

## 2017-05-24 MED ORDER — WITCH HAZEL-GLYCERIN EX PADS: 1.0000 "application " | MEDICATED_PAD | CUTANEOUS | Status: DC | PRN

## 2017-05-24 MED ORDER — DIPHENHYDRAMINE HCL 25 MG PO CAPS: 25.0000 mg | ORAL_CAPSULE | Freq: Four times a day (QID) | ORAL | Status: DC | PRN

## 2017-05-24 MED ORDER — ACETAMINOPHEN 325 MG PO TABS: 650.0000 mg | ORAL_TABLET | ORAL | Status: DC | PRN

## 2017-05-24 MED ORDER — SENNOSIDES-DOCUSATE SODIUM 8.6-50 MG PO TABS
2.0000 | ORAL_TABLET | ORAL | Status: DC
  Administered 2017-05-24 – 2017-05-25 (×2): 2 via ORAL
  Filled 2017-05-24 (×2): qty 2

## 2017-05-24 MED ORDER — IBUPROFEN 600 MG PO TABS
600.0000 mg | ORAL_TABLET | Freq: Four times a day (QID) | ORAL | Status: DC
  Administered 2017-05-24 – 2017-05-26 (×9): 600 mg via ORAL
  Filled 2017-05-24 (×9): qty 1

## 2017-05-24 MED ORDER — TETANUS-DIPHTH-ACELL PERTUSSIS 5-2.5-18.5 LF-MCG/0.5 IM SUSP: 0.5000 mL | Freq: Once | INTRAMUSCULAR | Status: DC

## 2017-05-24 MED ORDER — ONDANSETRON HCL 4 MG/2ML IJ SOLN: 4.0000 mg | INTRAMUSCULAR | Status: DC | PRN

## 2017-05-24 MED ORDER — DIBUCAINE 1 % RE OINT: 1.0000 "application " | TOPICAL_OINTMENT | RECTAL | Status: DC | PRN

## 2017-05-24 MED ORDER — OXYCODONE-ACETAMINOPHEN 5-325 MG PO TABS
1.0000 | ORAL_TABLET | ORAL | Status: DC | PRN
  Administered 2017-05-25 – 2017-05-26 (×2): 1 via ORAL
  Filled 2017-05-24 (×2): qty 1

## 2017-05-24 MED ORDER — BENZOCAINE-MENTHOL 20-0.5 % EX AERO: 1.0000 "application " | INHALATION_SPRAY | CUTANEOUS | Status: DC | PRN

## 2017-05-24 MED ORDER — OXYCODONE-ACETAMINOPHEN 5-325 MG PO TABS: 2.0000 | ORAL_TABLET | ORAL | Status: DC | PRN

## 2017-05-24 MED ORDER — ONDANSETRON HCL 4 MG PO TABS: 4.0000 mg | ORAL_TABLET | ORAL | Status: DC | PRN

## 2017-05-24 MED ORDER — SIMETHICONE 80 MG PO CHEW: 80.0000 mg | CHEWABLE_TABLET | ORAL | Status: DC | PRN

## 2017-05-24 MED ORDER — COCONUT OIL OIL
1.0000 "application " | TOPICAL_OIL | Status: DC | PRN
  Administered 2017-05-25: 1 via TOPICAL
  Filled 2017-05-24: qty 120

## 2017-05-24 MED ORDER — PRENATAL MULTIVITAMIN CH
1.0000 | ORAL_TABLET | Freq: Every day | ORAL | Status: DC
  Administered 2017-05-24 – 2017-05-26 (×3): 1 via ORAL
  Filled 2017-05-24: qty 1

## 2017-05-24 NOTE — Anesthesia Postprocedure Evaluation (Signed)
Anesthesia Post Note  Patient: Lisa House  Procedure(s) Performed: * No procedures listed *  Patient location during evaluation: Mother Baby Anesthesia Type: Epidural Level of consciousness: awake and alert Pain management: pain level controlled Vital Signs Assessment: post-procedure vital signs reviewed and stable Respiratory status: spontaneous breathing, nonlabored ventilation and respiratory function stable Cardiovascular status: stable Postop Assessment: epidural receding Anesthetic complications: no     Last Vitals:  Vitals:   05/24/17 0521 05/24/17 0804  BP: (!) 128/51 (!) 104/52  Pulse: 65 66  Resp: 18 17  Temp: 36.6 C (!) 36.4 C    Last Pain:  Vitals:   05/24/17 0804  TempSrc: Oral  PainSc:                  KEPHART,WILLIAM K

## 2017-05-24 NOTE — Progress Notes (Signed)
SVD of viable female infant per Dr Bonney AidStaebler.

## 2017-05-24 NOTE — Discharge Summary (Signed)
Obstetric Discharge Summary  Reason for Admission: induction of labor Prenatal Procedures: none Intrapartum Procedures: spontaneous vaginal delivery Postpartum Procedures: none Complications-Operative and Postpartum: none   Hemoglobin  Date Value Ref Range Status  05/25/2017 8.8 (L) 12.0 - 16.0 g/dL Final  40/98/119104/20/2018 9.7 (L) 11.1 - 15.9 g/dL Final   HCT  Date Value Ref Range Status  05/25/2017 26.6 (L) 35.0 - 47.0 % Final   Hematocrit  Date Value Ref Range Status  02/27/2017 29.0 (L) 34.0 - 46.6 % Final    Physical Exam:  General: alert, cooperative, appears stated age and no distress Lochia: appropriate Uterine Fundus: firm Incision: NA DVT Evaluation: No evidence of DVT seen on physical exam.  Prenatal Labs: O positive, Rubella Immune, Varicella Non-Immune. Patient states she received vaccine following her prior delivery. She declines vaccine today.   Discharge Diagnoses: Term Pregnancy-delivered  Discharge Information: Date: 05/26/2017 Activity: pelvic rest Diet: routine Allergies as of 05/26/2017   No Known Allergies     Medication List    STOP taking these medications   CONCEPT DHA 53.5-38-1 MG Caps     TAKE these medications   CITRANATAL BLOOM 90-1 MG Tabs Take 1 tablet by mouth daily.   norethindrone 0.35 MG tablet Commonly known as:  MICRONOR,CAMILA,ERRIN Take 1 tablet (0.35 mg total) by mouth daily. Start taking on:  06/07/2017   valACYclovir 500 MG tablet Commonly known as:  VALTREX Take 1 tablet (500 mg total) by mouth 2 (two) times daily.      Contraception: Progesterone-only pill Condition: stable Discharge to: home Follow-up Information    Vena AustriaStaebler, Andreas, MD Follow up in 6 week(s).   Specialty:  Obstetrics and Gynecology Contact information: [address] [place] [address] [phone]          Newborn Data: Live born unspecified sex  Birth Weight:  3770 g APGAR: 9, 10 Infant feeding: breast/formula  Home with  mother.  Tresea MallJane Kirke Breach, [name] 05/26/2017, 9:57 AM

## 2017-05-25 LAB — CBC
HCT: 26.6 % — ABNORMAL LOW (ref 35.0–47.0)
Hemoglobin: 8.8 g/dL — ABNORMAL LOW (ref 12.0–16.0)
MCH: 23.6 pg — AB (ref 26.0–34.0)
MCHC: 33.2 g/dL (ref 32.0–36.0)
MCV: 71.1 fL — AB (ref 80.0–100.0)
PLATELETS: 177 10*3/uL (ref 150–440)
RBC: 3.75 MIL/uL — AB (ref 3.80–5.20)
RDW: 16.1 % — ABNORMAL HIGH (ref 11.5–14.5)
WBC: 9.3 10*3/uL (ref 3.6–11.0)

## 2017-05-25 MED ORDER — FERROUS FUMARATE 324 (106 FE) MG PO TABS
1.0000 | ORAL_TABLET | Freq: Every day | ORAL | Status: DC
  Administered 2017-05-25 – 2017-05-26 (×2): 106 mg via ORAL
  Filled 2017-05-25 (×2): qty 1

## 2017-05-25 MED ORDER — DOCUSATE SODIUM 100 MG PO CAPS
100.0000 mg | ORAL_CAPSULE | Freq: Every day | ORAL | Status: DC
  Administered 2017-05-25 – 2017-05-26 (×2): 100 mg via ORAL
  Filled 2017-05-25 (×2): qty 1

## 2017-05-25 NOTE — Lactation Note (Signed)
This note was copied from a baby's chart. Lactation Consultation Note  Patient Name: Girl Coralyn MarkKristin Verdejo ZOXWR'UToday's Date: 05/25/2017 Reason for consult: Follow-up assessment   Maternal Data    Feeding Feeding Type: Breast Fed Length of feed: 20 min  LATCH Score/Interventions                      Lactation Tools Discussed/Used     Consult Status Consult Status: PRN Mom states that she didn't want to breastfeed at first due to baby spitting up. She assumed baby was lactose intolerant. LC explained that baby was more than likely spitting up more amniotic fluid mixed with formula. Mom agreed and said spit-up was mostly clear. Baby has been nursing well since early am according to mom and isn't having any issues with latching/positioning, etc. Mom has coconut oil in the room.    Burnadette PeterJaniya M Tedric Leeth 05/25/2017, 11:09 AM

## 2017-05-25 NOTE — Progress Notes (Signed)
Post Partum Day 1 Subjective: voiding, tolerating PO and cramping with breast feeding. Taking Motrin and Tylenol with some relief.   Objective: Blood pressure 121/61, pulse 60, temperature 97.8 F (36.6 C), temperature source Oral, resp. rate 18, height 5\' 6"  (1.676 m), weight 112.5 kg (248 lb), last menstrual period 08/16/2016, SpO2 98 %, unknown if currently breastfeeding.  Physical Exam:  General: alert, cooperative and no distress Lochia: appropriate Uterine Fundus: firm/ U-1? ML/ NT  DVT Evaluation: No evidence of DVT seen on physical exam.   Recent Labs  05/23/17 0848 05/25/17 0400  HGB 9.0* 8.8*  HCT 26.1* 26.6*  WBC 8.6 9.3  PLT 172 177    Assessment/Plan: stable PPD#1 Plan for discharge tomorrow Breast and bottle O POS/ RI/ VNI-vaccinate with Varivax prior to discharge [place] Chronic iron deficiency anemia: iron and vitamins  LOS: 2 days   Lisa House 05/25/2017, 10:24 AM

## 2017-05-26 LAB — RPR: RPR: NONREACTIVE

## 2017-05-26 MED ORDER — CITRANATAL BLOOM 90-1 MG PO TABS: 1.0000 | ORAL_TABLET | Freq: Every day | ORAL | 2 refills | Status: DC

## 2017-05-26 MED ORDER — NORETHINDRONE 0.35 MG PO TABS: 1.0000 | ORAL_TABLET | Freq: Every day | ORAL | 11 refills | Status: DC

## 2017-05-26 NOTE — Lactation Note (Signed)
This note was copied from a baby's chart. Lactation Consultation Note  Patient Name: [name] JXBJY'NToday's Date: 05/26/2017  During LC rounds, Mom states pumping is going "well" she denied needing any supplies, education or LC assist at this time.    Maternal Data Formula Feeding for Exclusion: No (breast and bottle )  Feeding Feeding Type: Breast Fed Length of feed: 15 min  LATCH Score/Interventions                      Lactation Tools Discussed/Used     Consult Status      Sunday CornSandra Clark Jamine Wingate 05/26/2017, 12:36 PM

## 2017-05-26 NOTE — Discharge Instructions (Signed)
Please call your doctor or return to the ER if you experience any chest pains, shortness of breath, dizziness, visual changes, fever greater than 101, any heavy bleeding (saturating more than 1 pad per hour), large clots, or foul smelling discharge, any worsening abdominal pain and cramping that is not controlled by pain medication, or any signs of postpartum depression. No tampons, enemas, douches, or sexual intercourse for 6 weeks. Also avoid tub baths, hot tubs, or swimming for 6 weeks.  °

## 2017-05-26 NOTE — Progress Notes (Signed)
Discharge order received from doctor. Educated patient on recommendation of receiving varicella vaccine due to prenatal labs showing pt being nonimmune. Patient declined varicella vaccine. Reviewed discharge instructions and prescriptions with patient and answered all questions. Follow up appointment instructions given. Patient verbalized understanding. ID bands checked. Patient discharged home with infant via wheelchair by nursing/auxillary.    Oswald HillockAbigail Garner, RN

## 2017-05-29 ENCOUNTER — Encounter: Payer: Self-pay | Admitting: Obstetrics and Gynecology

## 2017-09-07 ENCOUNTER — Encounter: Payer: Self-pay | Admitting: Certified Nurse Midwife

## 2017-09-07 ENCOUNTER — Ambulatory Visit (INDEPENDENT_AMBULATORY_CARE_PROVIDER_SITE_OTHER): Payer: Medicaid Other | Admitting: Certified Nurse Midwife

## 2017-09-07 VITALS — BP 108/70 | Wt 240.0 lb

## 2017-09-07 DIAGNOSIS — N926 Irregular menstruation, unspecified: Secondary | ICD-10-CM | POA: Diagnosis not present

## 2017-09-07 DIAGNOSIS — Z3687 Encounter for antenatal screening for uncertain dates: Secondary | ICD-10-CM

## 2017-09-07 DIAGNOSIS — O99210 Obesity complicating pregnancy, unspecified trimester: Secondary | ICD-10-CM

## 2017-09-07 DIAGNOSIS — O0991 Supervision of high risk pregnancy, unspecified, first trimester: Secondary | ICD-10-CM | POA: Diagnosis not present

## 2017-09-07 DIAGNOSIS — Z8279 Family history of other congenital malformations, deformations and chromosomal abnormalities: Secondary | ICD-10-CM

## 2017-09-07 LAB — POCT URINE PREGNANCY: PREG TEST UR: POSITIVE — AB

## 2017-09-07 MED ORDER — FOLIC ACID 1 MG PO TABS: 1.0000 mg | ORAL_TABLET | Freq: Every day | ORAL | 3 refills | Status: DC

## 2017-09-07 NOTE — Progress Notes (Signed)
New Obstetric Patient H&P    Chief Complaint: "Desires prenatal care"   History of Present Illness: Patient is a 26 y.o. [phone] Not Hispanic or Latino female, with unsure LMP the beginning of September (?07/20/2017), who presents with amenorrhea and positive home pregnancy test. Based on her  LMP, her EDD is 04/26/2018 and her EGA is 4953w0d. She has had only two menses since her vaginal delivery in July of this year-one in August and another in September (sometime in the first 2 weeks). Her last pap smear was 10/17/2016  and was NIL.    She had a urine pregnancy test which was positive 14 October (2 weeks ago). Since her LMP she claims she has experienced nausea and some mild cramping in lower abdomen. She has vomited only once. She denies vaginal bleeding. Her past medical history is remarkable for HSV, OCD, psoriasis, depression, anemia (hmg in July 8.8gm/dl) , obesity, GERD, IBS-D . Her prior pregnancies are notable for three TSVDs following inductions for oligohydraminos with G1 and postdates with G2 and obesity at term ith G3. Weights of babies ranged from 7#4oz to 8#15 oz.   Since her LMP, she admits to the use of tobacco products  No Since her LMP she has had a couple of drinks, but none since her positive pregnancy test. She denies use of illicit street drugs She claims she has gained   one pound since the start of her pregnancy.  There are cats in the home in the home  no If yes NA She admits close contact with children on a regular basis  yes  She has had chicken pox in the past no She has had Tuberculosis exposures, symptoms, or previously tested positive for TB   no Current or past history of domestic violence. no  Genetic Screening/Teratology Counseling: (Includes patient, baby's father, or anyone in either family with:)   1. [facility]= 29[facility]  no 2. Thalassemia ([place], [place], Mediterranean, or Asian background): MCV<80  no 3. Neural tube defect  (meningomyelocele, spina bifida, anencephaly)  no 4. Congenital heart defect  yes , first child had aortic stenosis which was improved after balloon valvuloplasty at 783 weeks of age. Currently on medication. May need AV replacement in future. 5. Down syndrome  no 6. Tay-Sachs (Jewish, [place])  no 7. Canavan's Disease  no 8. Sickle cell disease or trait (African)  no  9. Hemophilia or other blood disorders  no  10. Muscular dystrophy  no  11. Cystic fibrosis  no  12. Huntington's Chorea  no  13. Mental retardation/autism  no 14. Other inherited genetic or chromosomal disorder  no 15. Maternal metabolic disorder (DM, PKU, etc)  no 16. Patient or FOB with a child with a birth defect not listed above no  16a. Patient or FOB with a birth defect themselves no 17. Recurrent pregnancy loss, or stillbirth  no  18. Any medications since LMP other than prenatal vitamins (include vitamins, supplements, OTC meds, drugs, alcohol)  no 19. Any other genetic/environmental exposure to discuss  no  Infection History:   1. Lives with someone with TB or TB exposed  no  2. Patient or partner has history of genital herpes  yes 3. Rash or viral illness since LMP  no 4. History of STI (GC, CT, HPV, syphilis, HIV)  Yes, HSV 5. History of recent travel :  no  Other pertinent information:  Yes, FOB is Christen BameRonnie, age 228, who is  father of all children     Review of Systems:10 point review of systems negative unless otherwise noted in HPI  Past Medical History:  Past Medical History:  Diagnosis Date  . Allergy   . Depression   . Eczema   . GERD (gastroesophageal reflux disease)   . Herpes genitalia 2011   Type 1 by culture  . Intertrigo   . Irritable bowel syndrome   . Obesity   . OCD (obsessive compulsive disorder)   . Panic attack   . Polycystic ovarian disease   . Skin lesion     Past Surgical History:  Past Surgical History:  Procedure Laterality Date  . CHOLECYSTECTOMY  01/09/2011    Dr. Excell Seltzer    Gynecologic History: Patient's last menstrual period was 07/20/2017.  Obstetric History: OB History  Gravida Para Term Preterm AB Living  4 3 3     3   SAB TAB Ectopic Multiple Live Births        0 3    # Outcome Date GA Lbr Len/2nd Weight Sex Delivery Anes PTL Lv  4 Current           3 Term 05/24/17 [redacted]w[redacted]d / 00:16 8 lb 5 oz (3.77 kg) F Vag-Spont EPI  LIV  2 Term 11/20/14 [redacted]w[redacted]d  7 lb 4 oz (3.289 kg) M Vag-Spont   LIV  1 Term 02/20/10 [redacted]w[redacted]d  8 lb 15 oz (4.054 kg) M Vag-Spont   LIV     Complications: Low amniotic fluid     Birth Comments: aortic stenosis    Obstetric Comments  All labors were induced    Family History:  Family History  Problem Relation Age of Onset  . Hypertension Mother   . Diabetes Mother   . Headache Mother   . Hypertension Father   . Lung cancer Father   . Asthma Father   . Heart disease Son        Aortic Valve Disorder Stenosis-repaired  . Ovarian cancer Maternal Grandmother     Social History:  Social History   Social History  . Marital status: Single    Spouse name: N/A  . Number of children: 3  . Years of education: highschool   Occupational History  .  Unemployed   Social History Main Topics  . Smoking status: Never Smoker  . Smokeless tobacco: Never Used  . Alcohol use No  . Drug use: No  . Sexual activity: Yes    Partners: Male   Other Topics Concern  . Not on file   Social History Narrative  . No narrative on file    Allergies:  No Known Allergies  Medications:none  Physical Exam Vitals: BP 108/70   Wt 240 lb (108.9 kg)   LMP 07/20/2017   BMI 38.74 kg/m   General: WF in NAD, presents with 26 year old and 4mos old children  PE deferred at this time   Neurologic: Grossly intact Psychiatric: mood appropriate, affect full   Assessment: 26 y.o. G4P3003 at [redacted]w[redacted]d by unsure LMP presenting to initiate prenatal care History of HSV Obesity with BMI=38.6kg/m2 First baby with CAD-aortic stenosis History of  depression History of anemia Close spacing of children  Plan: 1) Avoid alcoholic beverages. 2) Urine culture and UDS today. Will do NOB labs when she returns for ultrasound for dating and physical exam in 1-2 weeks. Will need early 1 hour GTT scheduled. 3) Discontinue the use of all non-medicinal drugs and chemicals.  4) Take prenatal vitamins daily. Given  samples of Citranatal Bloom and a prescription for folic acid 1 mgm daily 5) Nutrition, food safety (fish, cheese advisories, and high nitrite foods) and exercise discussed. 6) Hospital and practice style discussed with cross coverage system.  7) Genetic Screening, such as with 1st Trimester Screening, AFP testing, and Ultrasound, is discussed with patient. At the conclusion of today's visit patient requested genetic testing. Desires first trimester test. Offer fetal echo at 22-24 weeks due to hx of aortic stenosis in first son. 8) Patient is asked about travel to areas at risk for the Zika virus, and counseled to avoid travel and exposure to mosquitoes or sexual partners who may have themselves been exposed to the virus. Testing is discussed, and will be ordered as appropriate.   Farrel Conners, [name]

## 2017-09-08 ENCOUNTER — Encounter: Payer: Self-pay | Admitting: Certified Nurse Midwife

## 2017-09-08 DIAGNOSIS — O99013 Anemia complicating pregnancy, third trimester: Secondary | ICD-10-CM | POA: Insufficient documentation

## 2017-09-09 LAB — URINE DRUG PANEL 7
Amphetamines, Urine: NEGATIVE ng/mL
Barbiturate Quant, Ur: NEGATIVE ng/mL
Benzodiazepine Quant, Ur: NEGATIVE ng/mL
Cannabinoid Quant, Ur: NEGATIVE ng/mL
Cocaine (Metab.): NEGATIVE ng/mL
OPIATE QUANT UR: NEGATIVE ng/mL
PCP QUANT UR: NEGATIVE ng/mL

## 2017-09-09 LAB — URINE CULTURE: Organism ID, Bacteria: NO GROWTH

## 2017-09-22 ENCOUNTER — Other Ambulatory Visit: Payer: Self-pay | Admitting: Certified Nurse Midwife

## 2017-09-22 ENCOUNTER — Ambulatory Visit (INDEPENDENT_AMBULATORY_CARE_PROVIDER_SITE_OTHER): Payer: Medicaid Other

## 2017-09-22 ENCOUNTER — Ambulatory Visit (INDEPENDENT_AMBULATORY_CARE_PROVIDER_SITE_OTHER): Payer: Medicaid Other | Admitting: Certified Nurse Midwife

## 2017-09-22 VITALS — BP 122/70 | Wt 243.0 lb

## 2017-09-22 DIAGNOSIS — Z3A08 8 weeks gestation of pregnancy: Secondary | ICD-10-CM

## 2017-09-22 DIAGNOSIS — Z3687 Encounter for antenatal screening for uncertain dates: Secondary | ICD-10-CM

## 2017-09-22 DIAGNOSIS — O0991 Supervision of high risk pregnancy, unspecified, first trimester: Secondary | ICD-10-CM

## 2017-09-22 DIAGNOSIS — Z8279 Family history of other congenital malformations, deformations and chromosomal abnormalities: Secondary | ICD-10-CM

## 2017-09-22 DIAGNOSIS — O99211 Obesity complicating pregnancy, first trimester: Secondary | ICD-10-CM

## 2017-09-22 DIAGNOSIS — O368990 Maternal care for other specified fetal problems, unspecified trimester, not applicable or unspecified: Secondary | ICD-10-CM

## 2017-09-22 DIAGNOSIS — Z1379 Encounter for other screening for genetic and chromosomal anomalies: Secondary | ICD-10-CM

## 2017-09-22 DIAGNOSIS — Z113 Encounter for screening for infections with a predominantly sexual mode of transmission: Secondary | ICD-10-CM

## 2017-09-22 NOTE — Progress Notes (Signed)
ROB NOB physical  Pt having nausea no vomiting. Declines flu shot   Coralyn MarkKristin Achenbach is a 26 y.o. [phone] Not Hispanic or Latino female, with unsure LMP the beginning of September (?07/20/2017), who presents for a NOB physical, NOB labs, and viability scan. She was seen 09/07/2017 for her NOB history (please see that note) Based on her  LMP, her EDD is 04/26/2018 and her EGA is 9wk1d. She has had only two menses since her vaginal delivery in July of this year-one in August and another in September (sometime in the first 2 weeks). Her last pap smear was 10/17/2016  and was NIL.  Ultrasound today reveals a SIUP with CRL 8wk 5days and FCA 166. Will change EDC to 04/29/2018 because of unsure LMP. There were two umbilical cysts seen, but the anatomy was otherwise normal.  Risk factors include obesity with a BMI=38.4 (243 pounds), close spacing of children,  OCD, history of depression, HSV, psoriasis, and a history of her first son having aortic valve stenosis. She had an episode of facial paresthesia and weakness on the one side of her body with her third pregnancy, which was thought to be an atypical migraine (had a neg MRI and MRV).  ROS: complains of worsening nausea and desires a RX for this. Denies vaginal bleeding  PE: BP 122/70   Wt 243 lb (110.2 kg)   LMP 07/20/2017   BMI 39.22 kg/m   Constitutional  : obese, well developed, alert, in no acute distress WF Neck  : no thyromegaly, no cervical lymphadenopathy Chest/Respiratory : breathing unlabored, normal breath sounds   Heart : regular rate, normal rhythm, no murmurs present. Breasts : breasts symmetrical, soft, hypo and hyperpigmented areas on both areolas (no change), no masses palpated, no axillary or supraclavicular or infraclavicular lymphademopathy Abdomen: abdomen nontender to palpation, normal bowel sounds, tone normal without rigidity or guarding, no masses present.  RUQ non-tender and without mass. No hernias present. There is an  intradermal lesion just right of midline, NT, about 1.5 cm in diameter. Pelvic:   External/BUS:  appearance healthy, no lesions present  Vagina:  nontender to palpation, no bleeding present  Cervix: closed, parous, NT  Uterus AV, 8-10 week size, mobile, NT  Adnexa : no adnexal tenderness present, no adnexal masses present  Skin:  Plaque with silvery scale noted on right foot, skin very dry. Neuro: grossly oriented to person, place and time Psyche : judgment and insight intact,  mood normal, affect appropriate  A/P: IUP at 8wk5days Umbilical cysts in the first trimester-if they persist into the second trimester, will consult MFM. Repeat ultrasound in 4 weeks to follow up on umbilical cysts Obesity: Already prescribed folic acid 1 mgm, schedule for 1 hour GTT when returns in 4 weeks Desires Materniti21 test rather than first trimester test-will get at next visit Refill PNV Given samples of Bonjesta. Will get PA for Diclegis and send RX for Diclegis to pharmacy History of son with aortic stenosis-get fetal echo at 22-24 weeks. Hx of HSV-start PPX with Valtrex at 36 weeks. GC/CHlamydia NAAT. PAp not due until 10/2017  Farrel Connersolleen Ramell Wacha, [name]

## 2017-09-23 DIAGNOSIS — O0990 Supervision of high risk pregnancy, unspecified, unspecified trimester: Secondary | ICD-10-CM | POA: Insufficient documentation

## 2017-09-23 DIAGNOSIS — O368990 Maternal care for other specified fetal problems, unspecified trimester, not applicable or unspecified: Secondary | ICD-10-CM | POA: Insufficient documentation

## 2017-09-23 DIAGNOSIS — Z8279 Family history of other congenital malformations, deformations and chromosomal abnormalities: Secondary | ICD-10-CM | POA: Insufficient documentation

## 2017-09-23 LAB — RPR+RH+ABO+RUB AB+AB SCR+CB...
ANTIBODY SCREEN: NEGATIVE
HIV SCREEN 4TH GENERATION: NONREACTIVE
Hematocrit: 34 % (ref 34.0–46.6)
Hemoglobin: 11 g/dL — ABNORMAL LOW (ref 11.1–15.9)
Hepatitis B Surface Ag: NEGATIVE
MCH: 24.1 pg — ABNORMAL LOW (ref 26.6–33.0)
MCHC: 32.4 g/dL (ref 31.5–35.7)
MCV: 75 fL — AB (ref 79–97)
PLATELETS: 233 10*3/uL (ref 150–379)
RBC: 4.56 x10E6/uL (ref 3.77–5.28)
RDW: 14.8 % (ref 12.3–15.4)
RPR: NONREACTIVE
Rh Factor: POSITIVE
Rubella Antibodies, IGG: 4.16 index (ref >0.99)
WBC: 8 10*3/uL (ref 3.4–10.8)

## 2017-09-23 MED ORDER — DOXYLAMINE-PYRIDOXINE 10-10 MG PO TBEC: 2.0000 | DELAYED_RELEASE_TABLET | Freq: Every day | ORAL | 5 refills | Status: DC

## 2017-09-23 MED ORDER — PRENATAL 27-0.8 MG PO TABS: 1.0000 | ORAL_TABLET | Freq: Every day | ORAL | 11 refills | Status: DC

## 2017-09-24 ENCOUNTER — Encounter: Payer: Self-pay | Admitting: Certified Nurse Midwife

## 2017-09-24 LAB — GC/CHLAMYDIA PROBE AMP
Chlamydia trachomatis, NAA: NEGATIVE
Neisseria gonorrhoeae by PCR: NEGATIVE

## 2017-10-20 ENCOUNTER — Other Ambulatory Visit: Payer: Medicaid Other

## 2017-10-20 ENCOUNTER — Encounter: Payer: Medicaid Other | Admitting: Obstetrics and Gynecology

## 2017-10-20 ENCOUNTER — Encounter: Payer: Medicaid Other | Admitting: Advanced Practice Midwife

## 2017-10-21 ENCOUNTER — Encounter: Payer: Self-pay | Admitting: Obstetrics and Gynecology

## 2017-10-21 ENCOUNTER — Ambulatory Visit (INDEPENDENT_AMBULATORY_CARE_PROVIDER_SITE_OTHER): Payer: Medicaid Other

## 2017-10-21 ENCOUNTER — Ambulatory Visit (INDEPENDENT_AMBULATORY_CARE_PROVIDER_SITE_OTHER): Payer: Medicaid Other | Admitting: Obstetrics and Gynecology

## 2017-10-21 ENCOUNTER — Other Ambulatory Visit: Payer: Self-pay | Admitting: Certified Nurse Midwife

## 2017-10-21 ENCOUNTER — Other Ambulatory Visit: Payer: Medicaid Other

## 2017-10-21 VITALS — BP 122/80 | Wt 250.0 lb

## 2017-10-21 DIAGNOSIS — O368990 Maternal care for other specified fetal problems, unspecified trimester, not applicable or unspecified: Secondary | ICD-10-CM

## 2017-10-21 DIAGNOSIS — O0991 Supervision of high risk pregnancy, unspecified, first trimester: Secondary | ICD-10-CM | POA: Diagnosis not present

## 2017-10-21 DIAGNOSIS — Z6841 Body Mass Index (BMI) 40.0 and over, adult: Secondary | ICD-10-CM

## 2017-10-21 DIAGNOSIS — Z1379 Encounter for other screening for genetic and chromosomal anomalies: Secondary | ICD-10-CM

## 2017-10-21 DIAGNOSIS — Z8659 Personal history of other mental and behavioral disorders: Secondary | ICD-10-CM

## 2017-10-21 DIAGNOSIS — O99210 Obesity complicating pregnancy, unspecified trimester: Secondary | ICD-10-CM

## 2017-10-21 DIAGNOSIS — A6000 Herpesviral infection of urogenital system, unspecified: Secondary | ICD-10-CM

## 2017-10-21 DIAGNOSIS — Z3A12 12 weeks gestation of pregnancy: Secondary | ICD-10-CM

## 2017-10-21 DIAGNOSIS — O99211 Obesity complicating pregnancy, first trimester: Secondary | ICD-10-CM

## 2017-10-21 DIAGNOSIS — O0990 Supervision of high risk pregnancy, unspecified, unspecified trimester: Secondary | ICD-10-CM

## 2017-10-21 DIAGNOSIS — Z8279 Family history of other congenital malformations, deformations and chromosomal abnormalities: Secondary | ICD-10-CM

## 2017-10-21 MED ORDER — FOLIC ACID 1 MG PO TABS: 1.0000 mg | ORAL_TABLET | Freq: Every day | ORAL | 5 refills | Status: DC

## 2017-10-21 NOTE — Progress Notes (Signed)
Routine Prenatal Care Visit  Subjective  Lisa House is a 26 y.o. [phone] at 673w6d being seen today for ongoing prenatal care.  She is currently monitored for the following issues for this high-risk pregnancy and has IBS (irritable bowel syndrome); Obesity in pregnancy, antepartum; OCD (obsessive compulsive disorder); Bilateral polycystic ovarian syndrome; Psoriasis; Allergic rhinitis; History of depression; Herpes genitalis; BMI 40.0-44.9, adult (HCC); Anemia; Supervision of high risk pregnancy, antepartum; Umbilical cord cyst during pregnancy, antepartum; and Family history of congenital aortic stenosis on their problem list.  ----------------------------------------------------------------------------------- Patient reports no complaints.    . Vag. Bleeding: None.   . Denies leaking of fluid.  ----------------------------------------------------------------------------------- The following portions of the patient's history were reviewed and updated as appropriate: allergies, current medications, past family history, past medical history, past social history, past surgical history and problem list. Problem list updated.   Objective  Blood pressure 122/80, weight 250 lb (113.4 kg), last menstrual period 07/20/2017, unknown if currently breastfeeding. Pregravid weight 239 lb (108.4 kg) Total Weight Gain 11 lb (4.99 kg) Urinalysis: Urine Protein: Negative Urine Glucose: Negative  Fetal Status: Fetal Heart Rate (bpm): Present         General:  Alert, oriented and cooperative. Patient is in no acute distress.  Skin: Skin is warm and dry. No rash noted.   Cardiovascular: Normal heart rate noted  Respiratory: Normal respiratory effort, no problems with respiration noted  Abdomen: Soft, gravid, appropriate for gestational age.       Pelvic:  Cervical exam deferred        Extremities: Normal range of motion.     Mental Status: Normal mood and affect. Normal behavior. Normal judgment and  thought content.   Assessment   26 y.o. [id number] at 323w6d by  04/29/2018, by Ultrasound presenting for routine prenatal visit  Plan   Pregnancy #4 Problems (from 07/20/17 to present)    Problem Noted Resolved   Supervision of high risk pregnancy, antepartum 09/23/2017 by [name], [name], [name] No   Overview Addendum 10/21/2017 11:37 AM by Conard NovakJackson, Darrian Goodwill D, MD     [facility]  Dating 8wk5d ultrasound Blood type: O/Positive/-- (11/13 1057)   Genetic Screen AFP:     NIPS: [ ]  per pt request Antibody:Negative (11/13 1057)  Anatomic US  Rubella: 4.16 (11/13 1057)/ Varicella NI  GTT Early:               Third trimester:  RPR: Non Reactive (11/13 1057)   Flu vaccine  HBsAg: Negative (11/13 1057)   TDaP vaccine                                               Rhogam: HIV: non reactive 11/13  Baby Food                                               GBS:  Contraception  Pap:  Circumcision    Pediatrician    Support Person     Umbilical cysts seen at 8wk5d - resolved at 223w6d      Umbilical cord cyst during pregnancy, antepartum 09/23/2017 by [name], [name], [name] No   Overview Addendum 10/21/2017 11:36 AM by Conard NovakJackson, Cosmo Tetreault D, MD    Seen at  [id number]- follow up with ultrasound in second trimester. If cysts persist consult MFM and genetics. - No evidence on 12/ week ultrasound. Follow up at 20 week ultrasound      BMI 40.0-44.9, adult (HCC) 02/27/2017 by Conard NovakJackson, Marella Vanderpol D, MD No   Overview Signed 10/21/2017 11:37 AM by Conard NovakJackson, Jaaron Oleson D, MD    BMI >=40 [x]  early 1h gtt - 12/12 [x]  u/s for dating [ ]   [x]  nutritional goals [x]  folic acid 1mg  [x]  bASA (>12 weeks) [ ]  consider nutrition consult [ ]  consider maternal EKG 1st trimester [ ]  Growth u/s 28 [ ] , 32 [ ] , 36 weeks [ ]  [ ]  NST/AFI weekly 36+ weeks (36[] , 37[] , 38[] , 39[] , 40[] ) [ ]  IOL by 41 weeks (scheduled, prn [] )     Herpes genitalis 02/08/2017 by [name], [name], [name] No   Overview Addendum 04/30/2017  10:14 AM by Conard NovakJackson, Nevaeh Korte D, MD    [ ]  Needs PPX at 36 weeks       Obesity in pregnancy, antepartum 05/09/2015 by Alba CorySowles, Krichna, MD No   Overview Signed 02/08/2017  4:06 PM by [name], [name], [name]    BMI>40. Need monthly growth scans. Start weekly antepartum testing at 36 weeks. Early glucola=82      History of depression 05/09/2015 by Alba CorySowles, Krichna, MD No     Please refer to After Visit Summary for other counseling recommendations.   Return in about 4 weeks (around 11/18/2017) for Routine Prenatal Appointment.  Thomasene MohairStephen Josey Forcier, MD  10/21/2017 11:46 AM

## 2017-10-22 ENCOUNTER — Encounter: Payer: Self-pay | Admitting: Certified Nurse Midwife

## 2017-10-22 LAB — GLUCOSE, 1 HOUR GESTATIONAL: Gestational Diabetes Screen: 91 mg/dL (ref 65–139)

## 2017-10-25 LAB — MATERNIT 21 PLUS CORE, BLOOD
CHROMOSOME 18: NEGATIVE
Chromosome 13: NEGATIVE
Chromosome 21: NEGATIVE
Y CHROMOSOME: DETECTED

## 2017-10-26 ENCOUNTER — Encounter: Payer: Self-pay | Admitting: Obstetrics and Gynecology

## 2017-10-27 ENCOUNTER — Telehealth: Payer: Self-pay | Admitting: Obstetrics and Gynecology

## 2017-10-27 ENCOUNTER — Telehealth: Payer: Self-pay | Admitting: Certified Nurse Midwife

## 2017-10-27 NOTE — Telephone Encounter (Signed)
Pt [facility]

## 2017-10-27 NOTE — Telephone Encounter (Signed)
Pt is calling to find out about results. Please advise

## 2017-10-27 NOTE — Telephone Encounter (Signed)
Pt is calling needing a prescription sent in for Vitamins please advise. Pt doesn't know the name of them

## 2017-10-27 NOTE — Telephone Encounter (Signed)
See pt message, sent to [facility] as I believed he called

## 2017-10-29 ENCOUNTER — Encounter: Payer: Self-pay | Admitting: Obstetrics & Gynecology

## 2017-10-29 NOTE — Telephone Encounter (Signed)
Please advise 

## 2017-10-29 NOTE — Telephone Encounter (Signed)
Let her know genetic testing normal and she has a boy (if she wants to know)

## 2017-11-05 ENCOUNTER — Telehealth: Payer: Self-pay | Admitting: Obstetrics and Gynecology

## 2017-11-05 NOTE — Telephone Encounter (Signed)
Spoke with patient. Turns out I had called her previously and given her the results, but did not document this.   She has no questions or needs at this time.

## 2017-11-10 NOTE — L&D Delivery Note (Signed)
Delivery Note At 9:14 PM a viable female was delivered via Vaginal, Spontaneous (Presentation: ROA).  APGAR: 8, 9; weight pending.   Placenta status: spontaneous, intact.  Cord: 3VC, bodycord x 1 without complications.  Cord pH: N/A  Anesthesia:  Epidural Episiotomy:  none Lacerations:  none Suture Repair: none Est. Blood Loss (mL):  250mL  Mom to postpartum.  Baby to Couplet care / Skin to Skin.  Vena Austriandreas Keniel Ralston 05/02/2018, 9:27 PM

## 2017-11-18 ENCOUNTER — Encounter: Payer: Self-pay | Admitting: Obstetrics and Gynecology

## 2017-11-18 ENCOUNTER — Ambulatory Visit (INDEPENDENT_AMBULATORY_CARE_PROVIDER_SITE_OTHER): Payer: Medicaid Other | Admitting: Obstetrics and Gynecology

## 2017-11-18 VITALS — BP 112/68 | Wt 248.0 lb

## 2017-11-18 DIAGNOSIS — K219 Gastro-esophageal reflux disease without esophagitis: Secondary | ICD-10-CM

## 2017-11-18 DIAGNOSIS — A6000 Herpesviral infection of urogenital system, unspecified: Secondary | ICD-10-CM

## 2017-11-18 DIAGNOSIS — O0990 Supervision of high risk pregnancy, unspecified, unspecified trimester: Secondary | ICD-10-CM

## 2017-11-18 DIAGNOSIS — O99210 Obesity complicating pregnancy, unspecified trimester: Secondary | ICD-10-CM

## 2017-11-18 DIAGNOSIS — Z6841 Body Mass Index (BMI) 40.0 and over, adult: Secondary | ICD-10-CM

## 2017-11-18 DIAGNOSIS — Z3A16 16 weeks gestation of pregnancy: Secondary | ICD-10-CM

## 2017-11-18 MED ORDER — RANITIDINE HCL 150 MG PO TABS: 150.0000 mg | ORAL_TABLET | Freq: Two times a day (BID) | ORAL | 11 refills | Status: DC

## 2017-11-18 NOTE — Patient Instructions (Signed)
[email or web address]

## 2017-11-18 NOTE — Progress Notes (Signed)
Routine Prenatal Care Visit  Subjective  Lisa House is a 27 y.o. [phone] at 4764w6d being seen today for ongoing prenatal care.  She is currently monitored for the following issues for this high-risk pregnancy and has IBS (irritable bowel syndrome); Obesity in pregnancy, antepartum; OCD (obsessive compulsive disorder); Bilateral polycystic ovarian syndrome; Psoriasis; Allergic rhinitis; History of depression; Herpes genitalis; BMI 40.0-44.9, adult (HCC); Anemia; Supervision of high risk pregnancy, antepartum; Umbilical cord cyst during pregnancy, antepartum; and Family history of congenital aortic stenosis on their problem list.  ----------------------------------------------------------------------------------- Patient reports epigastric pain that radiated to her back. Believes it is indigestion.  Contractions: Not present. Vag. Bleeding: None.  Movement: Absent. Denies leaking of fluid.  ----------------------------------------------------------------------------------- The following portions of the patient's history were reviewed and updated as appropriate: allergies, current medications, past family history, past medical history, past social history, past surgical history and problem list. Problem list updated.   Objective  Blood pressure 112/68, weight 248 lb (112.5 kg), last menstrual period 07/20/2017, unknown if currently breastfeeding. Pregravid weight 239 lb (108.4 kg) Total Weight Gain 9 lb (4.082 kg) Urinalysis: Urine Protein: Negative Urine Glucose: Negative  Fetal Status: Fetal Heart Rate (bpm): 145   Movement: Absent     General:  Alert, oriented and cooperative. Patient is in no acute distress.  Skin: Skin is warm and dry. No rash noted.   Cardiovascular: Normal heart rate noted  Respiratory: Normal respiratory effort, no problems with respiration noted  Abdomen: Soft, gravid, appropriate for gestational age. Pain/Pressure: Absent     Pelvic:  Cervical exam deferred         Extremities: Normal range of motion.     ental Status: Normal mood and affect. Normal behavior. Normal judgment and thought content.     Assessment   27 y.o. [id number] at 5964w6d by  04/29/2018, by Ultrasound presenting for routine prenatal visit  Plan   Pregnancy #4 Problems (from 07/20/17 to present)    Problem Noted Resolved   Supervision of high risk pregnancy, antepartum 09/23/2017 by [name], [name], [name] No   Overview Addendum 11/18/2017  9:03 AM by Natale MilchSchuman, Bo Rogue R, MD     [facility]  Dating 8wk5d ultrasound Blood type: O/Positive/-- (11/13 1057)   Genetic Screen AFP:     NIPS: [ ]  per pt request Antibody:Negative (11/13 1057)  Anatomic US  Rubella: 4.16 (11/13 1057)/ Varicella NI  GTT Early:   91            Third trimester:  RPR: Non Reactive (11/13 1057)   Flu vaccine  Declined  HBsAg: Negative (11/13 1057)   TDaP vaccine                                               Rhogam: HIV: non reactive 11/13  Baby Food   Breast                                            GBS:  Contraception  Undecided Pap:  Circumcision    Pediatrician    Support Person     Umbilical cysts seen at 8wk5d         Umbilical cord cyst during pregnancy, antepartum 09/23/2017 by [name], [name], [name] No  Overview Addendum 10/21/2017 11:36 AM by Conard Novak, MD    Seen at [phone]- follow up with ultrasound in second trimester. If cysts persist consult MFM and genetics. - No evidence on 12/ week ultrasound. Follow up at 20 week ultrasound      BMI 40.0-44.9, adult (HCC) 02/27/2017 by Conard Novak, MD No   Overview Signed 10/21/2017 11:37 AM by Conard Novak, MD    BMI >=40 [ ]  early 1h gtt -  [ ]  u/s for dating [ ]   [ ]  nutritional goals [ ]  folic acid 1mg  [ ]  bASA (>12 weeks) [ ]  consider nutrition consult [ ]  consider maternal EKG 1st trimester [ ]  Growth u/s 28 [ ] , 32 [ ] , 36 weeks [ ]  [ ]  NST/AFI weekly 36+ weeks (36[] , 37[] , 38[] , 39[] ,  40[] ) [ ]  IOL by 41 weeks (scheduled, prn [] )       Herpes genitalis 02/08/2017 by Farrel Conners, [name] No   Overview Addendum 04/30/2017 10:14 AM by Conard Novak, MD    [x]  Needs PPX at 36 weeks * lesion noted at 36-week visit, very small, upper right intralabial fold.  Cultured and negative for HSV.  Still need to make sure resolved before attempting vaginal delivery.      Obesity in pregnancy, antepartum 05/09/2015 by [name], MD No   Overview Signed 02/08/2017  4:06 PM by Farrel Conners, [name]    BMI>40. Need monthly growth scans. Start weekly antepartum testing at 36 weeks. Early glucola=82      History of depression 05/09/2015 by [name], MD No       Preterm labor symptoms and general obstetric precautions including but not limited to vaginal bleeding, contractions, leaking of fluid and fetal movement were reviewed in detail with the patient. Please refer to After Visit Summary for other counseling recommendations.   Given information about prenatal classes. Given information from [email or web address] about contraception choices Refused Flu vaccination despite counseling on risks and benefits Compalins of heartburn, given rx for Zantac.  Return in about 4 weeks (around 12/16/2017) for ROB anatomy US.

## 2017-11-27 ENCOUNTER — Telehealth: Payer: Self-pay

## 2017-11-27 NOTE — Telephone Encounter (Signed)
Please advise 

## 2017-11-27 NOTE — Telephone Encounter (Signed)
Pt requesting Concept DHA be sent to [facility] if before 5 today. If not, [facility] [place]. Pt states something else was called in & she picked it up yesterday & it makes her sick. [phone]

## 2017-11-30 ENCOUNTER — Other Ambulatory Visit: Payer: Self-pay | Admitting: Certified Nurse Midwife

## 2017-11-30 MED ORDER — CONCEPT DHA 53.5-38-1 MG PO CAPS: 1.0000 | ORAL_CAPSULE | Freq: Every day | ORAL | 11 refills | Status: DC

## 2017-11-30 NOTE — Telephone Encounter (Signed)
[facility] [place]

## 2017-12-16 ENCOUNTER — Ambulatory Visit (INDEPENDENT_AMBULATORY_CARE_PROVIDER_SITE_OTHER): Payer: Medicaid Other

## 2017-12-16 ENCOUNTER — Ambulatory Visit (INDEPENDENT_AMBULATORY_CARE_PROVIDER_SITE_OTHER): Payer: Medicaid Other | Admitting: Maternal Newborn

## 2017-12-16 ENCOUNTER — Encounter: Payer: Self-pay | Admitting: Maternal Newborn

## 2017-12-16 VITALS — BP 120/60 | Wt 256.0 lb

## 2017-12-16 DIAGNOSIS — O0990 Supervision of high risk pregnancy, unspecified, unspecified trimester: Secondary | ICD-10-CM | POA: Diagnosis not present

## 2017-12-16 DIAGNOSIS — Z3689 Encounter for other specified antenatal screening: Secondary | ICD-10-CM

## 2017-12-16 DIAGNOSIS — K5900 Constipation, unspecified: Secondary | ICD-10-CM

## 2017-12-16 DIAGNOSIS — Z3A20 20 weeks gestation of pregnancy: Secondary | ICD-10-CM | POA: Diagnosis not present

## 2017-12-16 DIAGNOSIS — R5383 Other fatigue: Secondary | ICD-10-CM

## 2017-12-16 DIAGNOSIS — Z8279 Family history of other congenital malformations, deformations and chromosomal abnormalities: Secondary | ICD-10-CM

## 2017-12-16 MED ORDER — DOCUSATE SODIUM 100 MG PO CAPS: 100.0000 mg | ORAL_CAPSULE | Freq: Two times a day (BID) | ORAL | 2 refills | Status: DC | PRN

## 2017-12-16 MED ORDER — FERROUS SULFATE 325 (65 FE) MG PO TABS: 325.0000 mg | ORAL_TABLET | Freq: Every day | ORAL | 1 refills | Status: DC

## 2017-12-16 MED ORDER — CONCEPT DHA 53.5-38-1 MG PO CAPS: 1.0000 | ORAL_CAPSULE | Freq: Every day | ORAL | 11 refills | Status: DC

## 2017-12-16 NOTE — Progress Notes (Signed)
C/o constipation, psoriasis on right foot - cream she has can't be used in preg.rj

## 2017-12-16 NOTE — Progress Notes (Signed)
Routine Prenatal Care Visit  Subjective  Lisa House is a 27 y.o. [phone] at 1659w6d being seen today for ongoing prenatal care.  She is currently monitored for the following issues for this high-risk pregnancy and has IBS (irritable bowel syndrome); Obesity in pregnancy, antepartum; OCD (obsessive compulsive disorder); Bilateral polycystic ovarian syndrome; Psoriasis; Allergic rhinitis; History of depression; Herpes genitalis; BMI 40.0-44.9, adult (HCC); Anemia; Supervision of high risk pregnancy, antepartum; Umbilical cord cyst during pregnancy, antepartum; and Family history of congenital aortic stenosis on their problem list.  ----------------------------------------------------------------------------------- Patient reports fatigue and constipation.   Contractions: Not present. Vag. Bleeding: None.  Movement: Present. Denies leaking of fluid.  ----------------------------------------------------------------------------------- The following portions of the patient's history were reviewed and updated as appropriate: allergies, current medications, past family history, past medical history, past social history, past surgical history and problem list. Problem list updated.   Objective  Last menstrual period 07/20/2017, unknown if currently breastfeeding. Pregravid weight 239 lb (108.4 kg) Total Weight Gain 17 lb (7.711 kg) Urinalysis: Urine Protein: Negative Urine Glucose: Negative  Fetal Status: Fetal Heart Rate (bpm): 141   Movement: Present     General:  Alert, oriented and cooperative. Patient is in no acute distress.  Skin: Skin is warm and dry. No rash noted.   Cardiovascular: Normal heart rate noted  Respiratory: Normal respiratory effort, no problems with respiration noted  Abdomen: Soft, gravid, appropriate for gestational age. Pain/Pressure: Absent     Pelvic:  Cervical exam deferred        Extremities: Normal range of motion.     Mental Status: Normal mood and affect.  Normal behavior. Normal judgment and thought content.     Assessment   27 y.o. [id number] at 1559w6d, EDD  04/29/2018 by Ultrasound presenting for routine prenatal visit.  Plan   Pregnancy #4 Problems (from 07/20/17 to present)    Problem Noted Resolved   Supervision of high risk pregnancy, antepartum 09/23/2017 by [name], [name], [name] No   Overview Addendum 11/18/2017  9:03 AM by Natale MilchSchuman, Christanna R, MD     [facility]  Dating 8wk5d ultrasound Blood type: O/Positive/-- (11/13 1057)   Genetic Screen AFP:     NIPS: [ ]  per pt request Antibody:Negative (11/13 1057)  Anatomic US  Rubella: 4.16 (11/13 1057)/ Varicella NI  GTT Early:   91            Third trimester:  RPR: Non Reactive (11/13 1057)   Flu vaccine  Declined  HBsAg: Negative (11/13 1057)   TDaP vaccine                                               Rhogam: HIV: non reactive 11/13  Baby Food   Breast                                            GBS:  Contraception  Undecided Pap:  Circumcision    Pediatrician    Support Person     Umbilical cysts seen at 8wk5d         Umbilical cord cyst during pregnancy, antepartum 09/23/2017 by [name], [name], [name] No   Overview Addendum 10/21/2017 11:36 AM by Conard NovakJackson, Stephen D, MD    Seen  at [id number]- follow up with ultrasound in second trimester. If cysts persist consult MFM and genetics. - No evidence on 12/ week ultrasound. Follow up at 20 week ultrasound      BMI 40.0-44.9, adult (HCC) 02/27/2017 by Conard Novak, MD No   Overview Signed 10/21/2017 11:37 AM by Conard Novak, MD    BMI >=40 [ ]  early 1h gtt -  [ ]  u/s for dating [ ]   [ ]  nutritional goals [ ]  folic acid 1mg  [ ]  bASA (>12 weeks) [ ]  consider nutrition consult [ ]  consider maternal EKG 1st trimester [ ]  Growth u/s 28 [ ] , 32 [ ] , 36 weeks [ ]  [ ]  NST/AFI weekly 36+ weeks (36[] , 37[] , 38[] , 39[] , 40[] ) [ ]  IOL by 41 weeks (scheduled, prn [] )       Herpes genitalis 02/08/2017 by  Farrel Conners, [name] No   Overview Addendum 04/30/2017 10:14 AM by Conard Novak, MD    [x]  Needs PPX at 36 weeks * lesion noted at 36-week visit, very small, upper right intralabial fold.  Cultured and negative for HSV.  Still need to make sure resolved before attempting vaginal delivery.      Obesity in pregnancy, antepartum 05/09/2015 by [name], MD No   Overview Signed 02/08/2017  4:06 PM by Farrel Conners, [name]    BMI>40. Need monthly growth scans. Start weekly antepartum testing at 36 weeks. Early glucola=82      History of depression 05/09/2015 by [name], MD No     Anatomy scan today, need follow up in 4 weeks for diaphragm, RVOT, LVOT. Patient elects for fetal echo due to history of child with congential aortic stenosis, will refer to pediatric cardiology.    Rx sent for Concept DHA vitamins, iron, and docusate.  Preterm labor symptoms and general obstetric precautions including but not limited to vaginal bleeding, contractions, leaking of fluid and fetal movement were reviewed in detail with the patient.  Return in about 4 weeks (around 01/13/2018) for ROB following ultrasound.  Marcelyn Bruins, [name] 12/16/2017  9:35 AM

## 2018-01-04 ENCOUNTER — Other Ambulatory Visit: Payer: Self-pay

## 2018-01-04 DIAGNOSIS — R5383 Other fatigue: Secondary | ICD-10-CM

## 2018-01-04 DIAGNOSIS — K5900 Constipation, unspecified: Secondary | ICD-10-CM

## 2018-01-04 DIAGNOSIS — O0990 Supervision of high risk pregnancy, unspecified, unspecified trimester: Secondary | ICD-10-CM

## 2018-01-04 MED ORDER — FERROUS SULFATE 325 (65 FE) MG PO TABS: 325.0000 mg | ORAL_TABLET | Freq: Every day | ORAL | 1 refills | Status: DC

## 2018-01-04 MED ORDER — DOCUSATE SODIUM 100 MG PO CAPS: 100.0000 mg | ORAL_CAPSULE | Freq: Two times a day (BID) | ORAL | 2 refills | Status: DC | PRN

## 2018-01-04 MED ORDER — CONCEPT DHA 53.5-38-1 MG PO CAPS: 1.0000 | ORAL_CAPSULE | Freq: Every day | ORAL | 11 refills | Status: AC

## 2018-01-04 NOTE — Telephone Encounter (Signed)
Pt states pnv, stool softener and iron was supposed to have been sent to pharm.  She went to p/u and they don't have rx.  [phone]  Rxs were sent in on 12/16/17.  Re-eRx'd rxs.  Unable to reach pt.

## 2018-01-13 ENCOUNTER — Encounter: Payer: Medicaid Other | Admitting: Advanced Practice Midwife

## 2018-01-13 ENCOUNTER — Other Ambulatory Visit: Payer: Medicaid Other

## 2018-01-13 ENCOUNTER — Encounter: Payer: Medicaid Other | Admitting: Obstetrics and Gynecology

## 2018-01-15 ENCOUNTER — Ambulatory Visit (INDEPENDENT_AMBULATORY_CARE_PROVIDER_SITE_OTHER): Payer: Medicaid Other

## 2018-01-15 ENCOUNTER — Encounter: Payer: Self-pay | Admitting: Certified Nurse Midwife

## 2018-01-15 ENCOUNTER — Ambulatory Visit (INDEPENDENT_AMBULATORY_CARE_PROVIDER_SITE_OTHER): Payer: Medicaid Other | Admitting: Certified Nurse Midwife

## 2018-01-15 VITALS — BP 100/74 | Wt 253.0 lb

## 2018-01-15 DIAGNOSIS — O0990 Supervision of high risk pregnancy, unspecified, unspecified trimester: Secondary | ICD-10-CM

## 2018-01-15 DIAGNOSIS — Z3A25 25 weeks gestation of pregnancy: Secondary | ICD-10-CM

## 2018-01-15 DIAGNOSIS — Z3689 Encounter for other specified antenatal screening: Secondary | ICD-10-CM | POA: Diagnosis not present

## 2018-01-15 DIAGNOSIS — O99210 Obesity complicating pregnancy, unspecified trimester: Secondary | ICD-10-CM

## 2018-01-15 DIAGNOSIS — Z6841 Body Mass Index (BMI) 40.0 and over, adult: Secondary | ICD-10-CM

## 2018-01-15 NOTE — Progress Notes (Signed)
ROB Pt gets dizzy sometimes otherwise feels good.

## 2018-01-16 NOTE — Progress Notes (Signed)
HROB at 25wk1d: BMI 40.83 Doing well. Tired. +FM Desires to breast feed baby Lisa House/ Mirena for contraception ROB/ 28 week labs and growth scan in 3 week Farrel Connersolleen Jacquise Rarick, [name]

## 2018-02-05 ENCOUNTER — Ambulatory Visit (INDEPENDENT_AMBULATORY_CARE_PROVIDER_SITE_OTHER): Payer: Medicaid Other | Admitting: Certified Nurse Midwife

## 2018-02-05 ENCOUNTER — Other Ambulatory Visit: Payer: Medicaid Other

## 2018-02-05 ENCOUNTER — Ambulatory Visit (INDEPENDENT_AMBULATORY_CARE_PROVIDER_SITE_OTHER): Payer: Medicaid Other

## 2018-02-05 VITALS — BP 120/60 | Wt 259.0 lb

## 2018-02-05 DIAGNOSIS — O0990 Supervision of high risk pregnancy, unspecified, unspecified trimester: Secondary | ICD-10-CM

## 2018-02-05 DIAGNOSIS — Z6841 Body Mass Index (BMI) 40.0 and over, adult: Secondary | ICD-10-CM | POA: Diagnosis not present

## 2018-02-05 DIAGNOSIS — R350 Frequency of micturition: Secondary | ICD-10-CM

## 2018-02-05 DIAGNOSIS — K219 Gastro-esophageal reflux disease without esophagitis: Secondary | ICD-10-CM

## 2018-02-05 DIAGNOSIS — O99210 Obesity complicating pregnancy, unspecified trimester: Secondary | ICD-10-CM

## 2018-02-05 DIAGNOSIS — Z3A28 28 weeks gestation of pregnancy: Secondary | ICD-10-CM

## 2018-02-05 LAB — POCT URINALYSIS DIPSTICK
Bilirubin, UA: NEGATIVE
Glucose, UA: NEGATIVE
LEUKOCYTES UA: NEGATIVE
NITRITE UA: NEGATIVE
PH UA: 6 (ref 5.0–8.0)
RBC UA: NEGATIVE
Spec Grav, UA: 1.025 (ref 1.010–1.025)
UROBILINOGEN UA: 0.2 U/dL

## 2018-02-05 MED ORDER — RANITIDINE HCL 150 MG PO TABS
150.0000 mg | ORAL_TABLET | Freq: Two times a day (BID) | ORAL | 4 refills | Status: DC | PRN
Start: 2018-02-05 — End: 2018-05-04

## 2018-02-05 MED ORDER — VALACYCLOVIR HCL 500 MG PO TABS: 500.0000 mg | ORAL_TABLET | Freq: Every day | ORAL | 2 refills | Status: DC

## 2018-02-05 NOTE — Progress Notes (Signed)
HROB at 28wk1d due to BMI>40, hx of HSV, OCD. O POS blood type C/o heartburn and reflux (causes coughing when she lies down). Never picked up Zantac RX. Has been using Tums with +/- relief. RX for Zantac sent. Advised to not lie down for 2 hours after eating. Baby active. Has been having irregular BH contractions and urinary frequency with voiding small amounts x 3 weeks. +1 protein on dipstick with ketones today, but no leuks, nitrites, blood. Urine culture sent Wants to start Valtrex PPX now in case she delivers early. RX for Valtrex 500 mgm daily sent to pharmacy Growth scan today: EFW 3# (69th%), AFI 18.46cm, cephalic  ROB in 2 weeks. Lisa House, [name]

## 2018-02-05 NOTE — Progress Notes (Signed)
Pt c/o heart burn. Requests new rx to be sent to [facility] [place]. Also feeling urinary pressure that comes and goes, unsure if it is due to baby's position. 28 wk labs today and growth u/s.

## 2018-02-06 LAB — 28 WEEK RH+PANEL
Basophils Absolute: 0 10*3/uL (ref 0.0–0.2)
Basos: 0 %
EOS (ABSOLUTE): 0.1 10*3/uL (ref 0.0–0.4)
Eos: 1 %
GESTATIONAL DIABETES SCREEN: 87 mg/dL (ref 65–139)
HEMATOCRIT: 27.8 % — AB (ref 34.0–46.6)
HEMOGLOBIN: 8.9 g/dL — AB (ref 11.1–15.9)
HIV SCREEN 4TH GENERATION: NONREACTIVE
Immature Grans (Abs): 0 10*3/uL (ref 0.0–0.1)
Immature Granulocytes: 0 %
LYMPHS ABS: 1.9 10*3/uL (ref 0.7–3.1)
Lymphs: 20 %
MCH: 23.2 pg — ABNORMAL LOW (ref 26.6–33.0)
MCHC: 32 g/dL (ref 31.5–35.7)
MCV: 73 fL — ABNORMAL LOW (ref 79–97)
MONOCYTES: 5 %
Monocytes Absolute: 0.4 10*3/uL (ref 0.1–0.9)
NEUTROS ABS: 6.8 10*3/uL (ref 1.4–7.0)
Neutrophils: 74 %
Platelets: 180 10*3/uL (ref 150–379)
RBC: 3.83 x10E6/uL (ref 3.77–5.28)
RDW: 15.5 % — AB (ref 12.3–15.4)
RPR: NONREACTIVE
WBC: 9.1 10*3/uL (ref 3.4–10.8)

## 2018-02-07 ENCOUNTER — Encounter: Payer: Self-pay | Admitting: Certified Nurse Midwife

## 2018-02-07 LAB — URINE CULTURE: ORGANISM ID, BACTERIA: NO GROWTH

## 2018-02-10 ENCOUNTER — Other Ambulatory Visit: Payer: Self-pay

## 2018-02-10 ENCOUNTER — Observation Stay
Admission: EM | Admit: 2018-02-10 | Discharge: 2018-02-10 | Disposition: A | Discharge status: Home / Self Care | Payer: Medicaid Other | Attending: Maternal Newborn | Admitting: Maternal Newborn

## 2018-02-10 DIAGNOSIS — O23593 Infection of other part of genital tract in pregnancy, third trimester: Principal | ICD-10-CM | POA: Insufficient documentation

## 2018-02-10 DIAGNOSIS — N760 Acute vaginitis: Secondary | ICD-10-CM

## 2018-02-10 DIAGNOSIS — Z3A28 28 weeks gestation of pregnancy: Secondary | ICD-10-CM | POA: Diagnosis not present

## 2018-02-10 LAB — WET PREP, GENITAL
SPERM: NONE SEEN
TRICH WET PREP: NONE SEEN
YEAST WET PREP: NONE SEEN

## 2018-02-10 LAB — CHLAMYDIA/NGC RT PCR (ARMC ONLY)
Chlamydia Tr: NOT DETECTED
N gonorrhoeae: NOT DETECTED

## 2018-02-10 MED ORDER — ACETAMINOPHEN 325 MG PO TABS: 650.0000 mg | ORAL_TABLET | ORAL | Status: DC | PRN

## 2018-02-10 MED ORDER — METRONIDAZOLE 0.75 % VA GEL: 1.0000 | Freq: Every day | VAGINAL | 0 refills | Status: AC

## 2018-02-10 NOTE — Progress Notes (Signed)
Pt. Discharged to home with family. Provided with discharge instructions. Patient stated that she had no further questions and will go to pharmacy to pick up prescription. FHT at discharge 145.   Patient contact number if needed for follow up results: [phone]

## 2018-02-10 NOTE — Final Progress Note (Signed)
Physician Final Progress Note  Patient ID: Lisa House MRN: [id number] DOB/AGE: 06-Apr-1991 26 y.o.  Admit date: 02/10/2018 Admitting provider: Natale Milch, MD Discharge date: 02/10/2018  Admission Diagnoses: Vaginal spotting  Discharge Diagnoses:  Active Problems:   Bacterial vaginosis  History of Present Illness: The patient is a 27 y.o. female [phone] at [redacted]w[redacted]d who presents for pink tinged spotting that occurred today at home after she was working outside. She noticed it when wiping. She also noticed some increased discharge. Denies recent intercourse, loss of fluid, painful contractions, and dysuria. Endorses good fetal movement.   10 point review of systems negative unless otherwise noted in HPI.  Past Medical History:  Diagnosis Date  . Allergy   . Depression   . Eczema   . GERD (gastroesophageal reflux disease)   . Herpes genitalia 2011   Type 1 by culture  . Intertrigo   . Irritable bowel syndrome   . Obesity   . OCD (obsessive compulsive disorder)   . Panic attack   . Polycystic ovarian disease   . Skin lesion     Past Surgical History:  Procedure Laterality Date  . CHOLECYSTECTOMY  01/09/2011   Dr. Excell Seltzer    No current facility-administered medications on file prior to encounter.    Current Outpatient Medications on File Prior to Encounter  Medication Sig Dispense Refill  . Prenatal Vit-Fe Fumarate-FA (PRENATAL MULTIVITAMIN) TABS tablet Take 1 tablet by mouth daily at 12 noon.    . ferrous sulfate (FERROUSUL) 325 (65 FE) MG tablet Take 1 tablet (325 mg total) by mouth daily with breakfast. (Patient not taking: Reported on 02/10/2018) 60 tablet 1  . ranitidine (ZANTAC) 150 MG tablet Take 1 tablet (150 mg total) by mouth 2 (two) times daily as needed for heartburn. (Patient not taking: Reported on 02/10/2018) 60 tablet 4  . valACYclovir (VALTREX) 500 MG tablet Take 1 tablet (500 mg total) by mouth daily. (Patient not taking: Reported on 02/10/2018) 30 tablet  2    No Known Allergies  Social History   Socioeconomic History  . Marital status: Single    Spouse name: Not on file  . Number of children: 3  . Years of education: highschool  . Highest education level: Not on file  Occupational History    Employer: UNEMPLOYED  Social Needs  . Financial resource strain: Not on file  . Food insecurity:    Worry: Not on file    Inability: Not on file  . Transportation needs:    Medical: Not on file    Non-medical: Not on file  Tobacco Use  . Smoking status: Former Games developer  . Smokeless tobacco: Never Used  Substance and Sexual Activity  . Alcohol use: No    Alcohol/week: 0.0 oz  . Drug use: No  . Sexual activity: Not Currently    Partners: Male  Lifestyle  . Physical activity:    Days per week: Not on file    Minutes per session: Not on file  . Stress: Not on file  Relationships  . Social connections:    Talks on phone: Not on file    Gets together: Not on file    Attends religious service: Not on file    Active member of club or organization: Not on file    Attends meetings of clubs or organizations: Not on file    Relationship status: Not on file  . Intimate partner violence:    Fear of current or ex partner: Not on  file    Emotionally abused: Not on file    Physically abused: Not on file    Forced sexual activity: Not on file  Other Topics Concern  . Not on file  Social History Narrative  . Not on file    Physical Exam: BP 111/69   Pulse (!) 117   Temp 98 F (36.7 C) (Oral)   Resp 20   Ht 5\' 5"  (1.651 m)   Wt 249 lb (112.9 kg)   LMP 07/20/2017   BMI 41.44 kg/m   Gen: NAD CV: regular rate Pulm: no increased work of breathing Pelvic: SSE, visually closed, moderate amount of white adherent discharge Ext: no signs of DVT  NST: Baseline: 140 Variability: moderate Accelerations: present Decelerations: absent Tocometry: occasional uterine irritability The patient was monitored for 30+ minutes, fetal heart rate  tracing was deemed reactive, category I tracing.  Consults: None  Significant Findings/ Diagnostic Studies: labs: wet prep positive for clue cells, GC pending  Procedures: SSE, NST  Discharge Condition: good  Disposition: Discharge disposition: 01-Home or Self Care       Diet: Regular diet  Discharge Activity: Activity as tolerated  Discharge Instructions    Discharge activity:  No Restrictions   Complete by:  As directed    Discharge diet:  No restrictions   Complete by:  As directed    No sexual activity restrictions   Complete by:  As directed    Notify physician for a general feeling that "something is not right"   Complete by:  As directed    Notify physician for increase or change in vaginal discharge   Complete by:  As directed    Notify physician for intestinal cramps, with or without diarrhea, sometimes described as "gas pain"   Complete by:  As directed    Notify physician for leaking of fluid   Complete by:  As directed    Notify physician for low, dull backache, unrelieved by heat or Tylenol   Complete by:  As directed    Notify physician for menstrual like cramps   Complete by:  As directed    Notify physician for pelvic pressure   Complete by:  As directed    Notify physician for uterine contractions.  These may be painless and feel like the uterus is tightening or the baby is  "balling up"   Complete by:  As directed    Notify physician for vaginal bleeding   Complete by:  As directed    PRETERM LABOR:  Includes any of the follwing symptoms that occur between 20 - [redacted] weeks gestation.  If these symptoms are not stopped, preterm labor can result in preterm delivery, placing your baby at risk   Complete by:  As directed      Allergies as of 02/10/2018   No Known Allergies     Medication List    TAKE these medications   ferrous sulfate 325 (65 FE) MG tablet Commonly known as:  FERROUSUL Take 1 tablet (325 mg total) by mouth daily with breakfast.    metroNIDAZOLE 0.75 % vaginal gel Commonly known as:  METROGEL Place 1 Applicatorful vaginally at bedtime for 5 days.   prenatal multivitamin Tabs tablet Take 1 tablet by mouth daily at 12 noon.   ranitidine 150 MG tablet Commonly known as:  ZANTAC Take 1 tablet (150 mg total) by mouth 2 (two) times daily as needed for heartburn.   valACYclovir 500 MG tablet Commonly known as:  VALTREX Take  1 tablet (500 mg total) by mouth daily.       Total time spent taking care of this patient: 15 minutes.  Signed: Oswaldo Conroy, [name]  02/10/2018, 10:25 PM

## 2018-02-10 NOTE — Discharge Summary (Signed)
See Final Progress Note 02/10/2018.  Marcelyn BruinsJacelyn Schmid, [name] 02/10/2018  10:29 PM

## 2018-02-10 NOTE — OB Triage Note (Signed)
Pt. Presented to ED with complaints of spotting that was pink tinged and occurred while she was doing housework. States that she called the doctor's office and notified them that she has been having spotting and has been having positive fetal movement. States that she has not being having any discharge. Patient states that she hasn't had sex within the past couple weeks. States that she feels [employer] CorporationBraxton Hicks contractions that are intermittent. Denies having blurred vision, headaches, or epigastric pain.

## 2018-02-12 ENCOUNTER — Telehealth: Payer: Self-pay | Admitting: Certified Nurse Midwife

## 2018-02-12 MED ORDER — CITRANATAL BLOOM 90-1 MG PO TABS: 1.0000 | ORAL_TABLET | Freq: Every day | ORAL | 5 refills | Status: DC

## 2018-02-19 ENCOUNTER — Ambulatory Visit (INDEPENDENT_AMBULATORY_CARE_PROVIDER_SITE_OTHER): Payer: Medicaid Other | Admitting: Maternal Newborn

## 2018-02-19 ENCOUNTER — Encounter: Payer: Self-pay | Admitting: Maternal Newborn

## 2018-02-19 ENCOUNTER — Encounter: Payer: Medicaid Other | Admitting: Obstetrics and Gynecology

## 2018-02-19 VITALS — BP 120/70 | Wt 256.0 lb

## 2018-02-19 DIAGNOSIS — Z23 Encounter for immunization: Secondary | ICD-10-CM

## 2018-02-19 DIAGNOSIS — Z3689 Encounter for other specified antenatal screening: Secondary | ICD-10-CM

## 2018-02-19 DIAGNOSIS — O0990 Supervision of high risk pregnancy, unspecified, unspecified trimester: Secondary | ICD-10-CM

## 2018-02-19 DIAGNOSIS — Z3A30 30 weeks gestation of pregnancy: Secondary | ICD-10-CM

## 2018-02-19 MED ORDER — TETANUS-DIPHTH-ACELL PERTUSSIS 5-2.5-18.5 LF-MCG/0.5 IM SUSP
0.5000 mL | Freq: Once | INTRAMUSCULAR | Status: AC
  Administered 2018-02-19: 0.5 mL via INTRAMUSCULAR

## 2018-02-19 NOTE — Progress Notes (Signed)
No concerns.rj 

## 2018-02-19 NOTE — Progress Notes (Signed)
Routine Prenatal Care Visit  Subjective  Lisa House is a 27 y.o. [phone] at [redacted]w[redacted]d being seen today for ongoing prenatal care.  She is currently monitored for the following issues for this high-risk pregnancy and has IBS (irritable bowel syndrome); Obesity in pregnancy, antepartum; OCD (obsessive compulsive disorder); Bilateral polycystic ovarian syndrome; Psoriasis; Allergic rhinitis; History of depression; Herpes genitalis; BMI 40.0-44.9, adult (HCC); Anemia; Supervision of high risk pregnancy, antepartum; Umbilical cord cyst during pregnancy, antepartum; Family history of congenital aortic stenosis; and Bacterial vaginosis on their problem list.  ----------------------------------------------------------------------------------- Patient reports no complaints.   Contractions: Not present. Vag. Bleeding: None.  Movement: Present. Denies leaking of fluid.  ----------------------------------------------------------------------------------- The following portions of the patient's history were reviewed and updated as appropriate: allergies, current medications, past family history, past medical history, past social history, past surgical history and problem list. Problem list updated.   Objective  Blood pressure 120/70, weight 256 lb (116.1 kg), last menstrual period 07/20/2017.  Pregravid weight 239 lb (108.4 kg) Total Weight Gain 17 lb (7.711 kg) Urinalysis: Urine Protein: Negative Urine Glucose: Negative  Fetal Status: Fetal Heart Rate (bpm): 138   Movement: Present     General:  Alert, oriented and cooperative. Patient is in no acute distress.  Skin: Skin is warm and dry. No rash noted.   Cardiovascular: Normal heart rate noted  Respiratory: Normal respiratory effort, no problems with respiration noted  Abdomen: Soft, gravid, appropriate for gestational age. Pain/Pressure: Absent     Pelvic:  Cervical exam deferred        Extremities: Normal range of motion.     Mental Status:  Normal mood and affect. Normal behavior. Normal judgment and thought content.     Assessment   26 y.o. [id number] at [redacted]w[redacted]d, EDD 04/29/2018 by Ultrasound presenting for routine prenatal visit.  Plan   Pregnancy #4 Problems (from 07/20/17 to present)    Problem Noted Resolved   Supervision of high risk pregnancy, antepartum 09/23/2017 by Farrel Conners, [name] No   Overview Addendum 01/16/2018  7:23 PM by Farrel Conners, [name]     Clinic [facility] Prenatal Labs  Dating 8wk5d ultrasound Blood type: O/Positive/-- (11/13 1057)   Genetic Screen AFP:     NIPS: [ ]  per pt request Antibody:Negative (11/13 1057)  Anatomic [place]  Rubella: 4.16 (11/13 1057)/ Varicella NI  GTT Early:   91            Third trimester:  RPR: Non Reactive (11/13 1057)   Flu vaccine  Declined  HBsAg: Negative (11/13 1057)   TDaP vaccine                                               Rhogam: HIV: non reactive 11/13  Baby Food   Breast                                            GBS:  Contraception  Mirena Pap:  Circumcision    Pediatrician    Support Person     Umbilical cysts seen at 8wk5d         Umbilical cord cyst during pregnancy, antepartum 09/23/2017 by Farrel Conners, [name] No   Overview Addendum 10/21/2017 11:36 AM by Conard Novak, MD  Seen at 8wk5days- follow up with ultrasound in second trimester. If cysts persist consult MFM and genetics. - No evidence on 12/ week ultrasound. Follow up at 20 week ultrasound      BMI 40.0-44.9, adult (HCC) 02/27/2017 by Conard NovakJackson, Stephen D, MD No   Overview Signed 10/21/2017 11:37 AM by Conard NovakJackson, Stephen D, MD    BMI >=40 [ ]  early 1h gtt -  [ ]  u/s for dating [ ]   [ ]  nutritional goals [ ]  folic acid 1mg  [ ]  bASA (>12 weeks) [ ]  consider nutrition consult [ ]  consider maternal EKG 1st trimester [ ]  Growth u/s 28 [ ] , 32 [ ] , 36 weeks [ ]  [ ]  NST/AFI weekly 36+ weeks (36[] , 37[] , 38[] , 39[] , 40[] ) [ ]  IOL by 41 weeks (scheduled, prn [] )       Herpes  genitalis 02/08/2017 by [name], [name], [name] No   Overview Addendum 04/30/2017 10:14 AM by Conard NovakJackson, Stephen D, MD    [x]  Needs PPX at 36 weeks      Obesity in pregnancy, antepartum 05/09/2015 by Alba CorySowles, Krichna, MD No   Overview Signed 02/08/2017  4:06 PM by [name], [name], [name]    BMI>40. Need monthly growth scans. Start weekly antepartum testing at 36 weeks. Early glucola=82      History of depression 05/09/2015 by Alba CorySowles, Krichna, MD No    TDaP today. Growth scan next visit. Gestational age appropriate obstetric precautions were reviewed. Please refer to After Visit Summary for other counseling recommendations.   Return in about 2 weeks (around 03/05/2018) for ROB and growth ultrasound.  Marcelyn BruinsJacelyn Schmid, [name]  02/19/2018  3:23 PM

## 2018-02-19 NOTE — Patient Instructions (Signed)
Third Trimester of Pregnancy The third trimester is from week 28 through week 40 (months 7 through 9). The third trimester is a time when the unborn baby (fetus) is growing rapidly. At the end of the ninth month, the fetus is about 20 inches in length and weighs 6-10 pounds. Body changes during your third trimester Your body will continue to go through many changes during pregnancy. The changes vary from woman to woman. During the third trimester:  Your weight will continue to increase. You can expect to gain 25-35 pounds (11-16 kg) by the end of the pregnancy.  You may begin to get stretch marks on your hips, abdomen, and breasts.  You may urinate more often because the fetus is moving lower into your pelvis and pressing on your bladder.  You may develop or continue to have heartburn. This is caused by increased hormones that slow down muscles in the digestive tract.  You may develop or continue to have constipation because increased hormones slow digestion and cause the muscles that push waste through your intestines to relax.  You may develop hemorrhoids. These are swollen veins (varicose veins) in the rectum that can itch or be painful.  You may develop swollen, bulging veins (varicose veins) in your legs.  You may have increased body aches in the pelvis, back, or thighs. This is due to weight gain and increased hormones that are relaxing your joints.  You may have changes in your hair. These can include thickening of your hair, rapid growth, and changes in texture. Some women also have hair loss during or after pregnancy, or hair that feels dry or thin. Your hair will most likely return to normal after your baby is born.  Your breasts will continue to grow and they will continue to become tender. A yellow fluid (colostrum) may leak from your breasts. This is the first milk you are producing for your baby.  Your belly button may stick out.  You may notice more swelling in your hands,  face, or ankles.  You may have increased tingling or numbness in your hands, arms, and legs. The skin on your belly may also feel numb.  You may feel short of breath because of your expanding uterus.  You may have more problems sleeping. This can be caused by the size of your belly, increased need to urinate, and an increase in your body's metabolism.  You may notice the fetus "dropping," or moving lower in your abdomen (lightening).  You may have increased vaginal discharge.  You may notice your joints feel loose and you may have pain around your pelvic bone.  What to expect at prenatal visits You will have prenatal exams every 2 weeks until week 36. Then you will have weekly prenatal exams. During a routine prenatal visit:  You will be weighed to make sure you and the baby are growing normally.  Your blood pressure will be taken.  Your abdomen will be measured to track your baby's growth.  The fetal heartbeat will be listened to.  Any test results from the previous visit will be discussed.  You may have a cervical check near your due date to see if your cervix has softened or thinned (effaced).  You will be tested for Group B streptococcus. This happens between 35 and 37 weeks.  Your health care provider may ask you:  What your birth plan is.  How you are feeling.  If you are feeling the baby move.  If you have had   any abnormal symptoms, such as leaking fluid, bleeding, severe headaches, or abdominal cramping.  If you are using any tobacco products, including cigarettes, chewing tobacco, and electronic cigarettes.  If you have any questions.  Other tests or screenings that may be performed during your third trimester include:  Blood tests that check for low iron levels (anemia).  Fetal testing to check the health, activity level, and growth of the fetus. Testing is done if you have certain medical conditions or if there are problems during the  pregnancy.  Nonstress test (NST). This test checks the health of your baby to make sure there are no signs of problems, such as the baby not getting enough oxygen. During this test, a belt is placed around your belly. The baby is made to move, and its heart rate is monitored during movement.  What is false labor? False labor is a condition in which you feel small, irregular tightenings of the muscles in the womb (contractions) that usually go away with rest, changing position, or drinking water. These are called Braxton Hicks contractions. Contractions may last for hours, days, or even weeks before true labor sets in. If contractions come at regular intervals, become more frequent, increase in intensity, or become painful, you should see your health care provider. What are the signs of labor?  Abdominal cramps.  Regular contractions that start at 10 minutes apart and become stronger and more frequent with time.  Contractions that start on the top of the uterus and spread down to the lower abdomen and back.  Increased pelvic pressure and dull back pain.  A watery or bloody mucus discharge that comes from the vagina.  Leaking of amniotic fluid. This is also known as your "water breaking." It could be a slow trickle or a gush. Let your health care provider know if it has a color or strange odor. If you have any of these signs, call your health care provider right away, even if it is before your due date. Follow these instructions at home: Medicines  Follow your health care provider's instructions regarding medicine use. Specific medicines may be either safe or unsafe to take during pregnancy.  Take a prenatal vitamin that contains at least 600 micrograms (mcg) of folic acid.  If you develop constipation, try taking a stool softener if your health care provider approves. Eating and drinking  Eat a balanced diet that includes fresh fruits and vegetables, whole grains, good sources of protein  such as meat, eggs, or tofu, and low-fat dairy. Your health care provider will help you determine the amount of weight gain that is right for you.  Avoid raw meat and uncooked cheese. These carry germs that can cause birth defects in the baby.  If you have low calcium intake from food, talk to your health care provider about whether you should take a daily calcium supplement.  Eat four or five small meals rather than three large meals a day.  Limit foods that are high in fat and processed sugars, such as fried and sweet foods.  To prevent constipation: ? Drink enough fluid to keep your urine clear or pale yellow. ? Eat foods that are high in fiber, such as fresh fruits and vegetables, whole grains, and beans. Activity  Exercise only as directed by your health care provider. Most women can continue their usual exercise routine during pregnancy. Try to exercise for 30 minutes at least 5 days a week. Stop exercising if you experience uterine contractions.  Avoid heavy   lifting.  Do not exercise in extreme heat or humidity, or at high altitudes.  Wear low-heel, comfortable shoes.  Practice good posture.  You may continue to have sex unless your health care provider tells you otherwise. Relieving pain and discomfort  Take frequent breaks and rest with your legs elevated if you have leg cramps or low back pain.  Take warm sitz baths to soothe any pain or discomfort caused by hemorrhoids. Use hemorrhoid cream if your health care provider approves.  Wear a good support bra to prevent discomfort from breast tenderness.  If you develop varicose veins: ? Wear support pantyhose or compression stockings as told by your healthcare provider. ? Elevate your feet for 15 minutes, 3-4 times a day. Prenatal care  Write down your questions. Take them to your prenatal visits.  Keep all your prenatal visits as told by your health care provider. This is important. Safety  Wear your seat belt at  all times when driving.  Make a list of emergency phone numbers, including numbers for family, friends, the hospital, and police and fire departments. General instructions  Avoid cat litter boxes and soil used by cats. These carry germs that can cause birth defects in the baby. If you have a cat, ask someone to clean the litter box for you.  Do not travel far distances unless it is absolutely necessary and only with the approval of your health care provider.  Do not use hot tubs, steam rooms, or saunas.  Do not drink alcohol.  Do not use any products that contain nicotine or tobacco, such as cigarettes and e-cigarettes. If you need help quitting, ask your health care provider.  Do not use any medicinal herbs or unprescribed drugs. These chemicals affect the formation and growth of the baby.  Do not douche or use tampons or scented sanitary pads.  Do not cross your legs for long periods of time.  To prepare for the arrival of your baby: ? Take prenatal classes to understand, practice, and ask questions about labor and delivery. ? Make a trial run to the hospital. ? Visit the hospital and tour the maternity area. ? Arrange for maternity or paternity leave through employers. ? Arrange for family and friends to take care of pets while you are in the hospital. ? Purchase a rear-facing car seat and make sure you know how to install it in your car. ? Pack your hospital bag. ? Prepare the baby's nursery. Make sure to remove all pillows and stuffed animals from the baby's crib to prevent suffocation.  Visit your dentist if you have not gone during your pregnancy. Use a soft toothbrush to brush your teeth and be gentle when you floss. Contact a health care provider if:  You are unsure if you are in labor or if your water has broken.  You become dizzy.  You have mild pelvic cramps, pelvic pressure, or nagging pain in your abdominal area.  You have lower back pain.  You have persistent  nausea, vomiting, or diarrhea.  You have an unusual or bad smelling vaginal discharge.  You have pain when you urinate. Get help right away if:  Your water breaks before 37 weeks.  You have regular contractions less than 5 minutes apart before 37 weeks.  You have a fever.  You are leaking fluid from your vagina.  You have spotting or bleeding from your vagina.  You have severe abdominal pain or cramping.  You have rapid weight loss or weight gain.    You have shortness of breath with chest pain.  You notice sudden or extreme swelling of your face, hands, ankles, feet, or legs.  Your baby makes fewer than 10 movements in 2 hours.  You have severe headaches that do not go away when you take medicine.  You have vision changes. Summary  The third trimester is from week 28 through week 40, months 7 through 9. The third trimester is a time when the unborn baby (fetus) is growing rapidly.  During the third trimester, your discomfort may increase as you and your baby continue to gain weight. You may have abdominal, leg, and back pain, sleeping problems, and an increased need to urinate.  During the third trimester your breasts will keep growing and they will continue to become tender. A yellow fluid (colostrum) may leak from your breasts. This is the first milk you are producing for your baby.  False labor is a condition in which you feel small, irregular tightenings of the muscles in the womb (contractions) that eventually go away. These are called Braxton Hicks contractions. Contractions may last for hours, days, or even weeks before true labor sets in.  Signs of labor can include: abdominal cramps; regular contractions that start at 10 minutes apart and become stronger and more frequent with time; watery or bloody mucus discharge that comes from the vagina; increased pelvic pressure and dull back pain; and leaking of amniotic fluid. This information is not intended to replace advice  given to you by your health care provider. Make sure you discuss any questions you have with your health care provider. Document Released: 10/21/2001 Document Revised: 04/03/2016 Document Reviewed: 12/28/2012 Elsevier Interactive Patient Education  2017 [employer]  

## 2018-03-04 ENCOUNTER — Encounter: Payer: Self-pay | Admitting: Maternal Newborn

## 2018-03-04 ENCOUNTER — Ambulatory Visit (INDEPENDENT_AMBULATORY_CARE_PROVIDER_SITE_OTHER): Payer: Medicaid Other

## 2018-03-04 ENCOUNTER — Ambulatory Visit (INDEPENDENT_AMBULATORY_CARE_PROVIDER_SITE_OTHER): Payer: Medicaid Other | Admitting: Maternal Newborn

## 2018-03-04 VITALS — BP 120/66 | Wt 255.0 lb

## 2018-03-04 DIAGNOSIS — Z3A32 32 weeks gestation of pregnancy: Secondary | ICD-10-CM

## 2018-03-04 DIAGNOSIS — Z3689 Encounter for other specified antenatal screening: Secondary | ICD-10-CM

## 2018-03-04 DIAGNOSIS — O99013 Anemia complicating pregnancy, third trimester: Secondary | ICD-10-CM

## 2018-03-04 DIAGNOSIS — O0990 Supervision of high risk pregnancy, unspecified, unspecified trimester: Secondary | ICD-10-CM

## 2018-03-04 MED ORDER — FERROUS SULFATE 325 (65 FE) MG PO TABS
325.0000 mg | ORAL_TABLET | Freq: Every day | ORAL | 4 refills | Status: DC
Start: 2018-03-04 — End: 2018-05-04

## 2018-03-04 NOTE — Progress Notes (Signed)
Routine Prenatal Care Visit  Subjective  Lisa House is a 27 y.o. [phone] at 6165w0d being seen today for ongoing prenatal care.  She is currently monitored for the following issues for this high-risk pregnancy and has IBS (irritable bowel syndrome); Obesity in pregnancy, antepartum; OCD (obsessive compulsive disorder); Bilateral polycystic ovarian syndrome; Psoriasis; Allergic rhinitis; History of depression; Herpes genitalis; BMI 40.0-44.9, adult (HCC); Anemia affecting pregnancy in third trimester; Supervision of high risk pregnancy, antepartum; Umbilical cord cyst during pregnancy, antepartum; Family history of congenital aortic stenosis; and Bacterial vaginosis on their problem list.  ----------------------------------------------------------------------------------- Patient reports fatigue. She is taking prenatal vitamins with iron but not currently on any iron supplements. Contractions: Not present. Vag. Bleeding: None.  Movement: Present. Denies leaking of fluid.  ----------------------------------------------------------------------------------- The following portions of the patient's history were reviewed and updated as appropriate: allergies, current medications, past family history, past medical history, past social history, past surgical history and problem list. Problem list updated.   Objective  Blood pressure 120/66, weight 255 lb (115.7 kg), last menstrual period 07/20/2017. Pregravid weight 239 lb (108.4 kg) Total Weight Gain 16 lb (7.258 kg) Urinalysis: Urine Protein: Negative Urine Glucose: Negative  Fetal Status: Fetal Heart Rate (bpm): 141   Movement: Present  Presentation: Transverse  General:  Alert, oriented and cooperative. Patient is in no acute distress.  Skin: Skin is warm and dry. No rash noted.   Cardiovascular: Normal heart rate noted  Respiratory: Normal respiratory effort, no problems with respiration noted  Abdomen: Soft, gravid, appropriate for  gestational age. Pain/Pressure: Absent     Pelvic:  Cervical exam deferred        Extremities: Normal range of motion.     Mental Status: Normal mood and affect. Normal behavior. Normal judgment and thought content.     Assessment   27 y.o. [id number] at 7965w0d, EDD 04/29/2018 by Ultrasound presenting for routine prenatal visit.  Plan   Pregnancy #4 Problems (from 07/20/17 to present)    Problem Noted Resolved   Supervision of high risk pregnancy, antepartum 09/23/2017 by [name], [name], [name] No   Overview Addendum 01/16/2018  7:23 PM by [name], [name], [name]     [facility]  Dating 8wk5d ultrasound Blood type: O/Positive/-- (11/13 1057)   Genetic Screen AFP:     NIPS: [ ]  per pt request Antibody:Negative (11/13 1057)  Anatomic US  Rubella: 4.16 (11/13 1057)/ Varicella NI  GTT Early:   91            Third trimester:  RPR: Non Reactive (11/13 1057)   Flu vaccine  Declined  HBsAg: Negative (11/13 1057)   TDaP vaccine                                               Rhogam: HIV: non reactive 11/13  Baby Food   Breast                                            GBS:  Contraception  Mirena Pap:  Circumcision    Pediatrician    Support Person     Umbilical cysts seen at 8wk5d         Umbilical cord cyst during pregnancy, antepartum 09/23/2017 by Sharen HonesGutierrez,  Jill Side, [name] No   Overview Addendum 10/21/2017 11:36 AM by Conard Novak, MD    Seen at [phone]- follow up with ultrasound in second trimester. If cysts persist consult MFM and genetics. - No evidence on 12/ week ultrasound. Follow up at 20 week ultrasound      BMI 40.0-44.9, adult (HCC) 02/27/2017 by Conard Novak, MD No   Overview Signed 10/21/2017 11:37 AM by Conard Novak, MD    BMI >=40 [ ]  early 1h gtt -  [ ]  u/s for dating [ ]   [ ]  nutritional goals [ ]  folic acid 1mg  [ ]  bASA (>12 weeks) [ ]  consider nutrition consult [ ]  consider maternal EKG 1st trimester [ ]  Growth u/s 28 [ ] , 32 [ ] ,  36 weeks [ ]  [ ]  NST/AFI weekly 36+ weeks (36[] , 37[] , 38[] , 39[] , 40[] ) [ ]  IOL by 41 weeks (scheduled, prn [] )       Herpes genitalis 02/08/2017 by Farrel Conners, [name] No   Overview Addendum 04/30/2017 10:14 AM by Conard Novak, MD    [x]  Needs PPX at 36 weeks * lesion noted at 36-week visit, very small, upper right intralabial fold.  Cultured and negative for HSV.  Still need to make sure resolved before attempting vaginal delivery.      Obesity in pregnancy, antepartum 05/09/2015 by [name], MD No   Overview Signed 02/08/2017  4:06 PM by Farrel Conners, [name]    BMI>40. Need monthly growth scans. Start weekly antepartum testing at 36 weeks. Early glucola=82      History of depression 05/09/2015 by [name], MD No    Growth ultrasound today 63rd percentile, AFI 16.97 cm. Repeat growth scan at 36 weeks.  Rx sent for ferrous sulfate; she is aware that she can take OTC equivalent if cost is prohibitive.  Gestational age appropriate obstetric precautions were reviewed.  Return in about 2 weeks (around 03/18/2018) for ROB.  Marcelyn Bruins, [name] 03/04/2018  10:41 AM

## 2018-03-04 NOTE — Progress Notes (Signed)
No concerns.rj 

## 2018-03-13 IMAGING — US US OB TRANSVAGINAL
1 series · 14 of 28 positions shown · non-contrast
Comparison: 10/07/2016

CLINICAL DATA: Pregnant, recent fall, right pelvic pain x6 hours

EXAM:
OBSTETRIC <14 WK US AND TRANSVAGINAL OB US
TECHNIQUE: Both transabdominal and transvaginal ultrasound examinations were
performed for complete evaluation of the gestation as well as the
maternal uterus, adnexal regions, and pelvic cul-de-sac.
Transvaginal technique was performed to assess early pregnancy.

[Series 1: us ob transvaginal · 0.19mm/px · 61 acquisitions, 14 frames shown]
[im 3/61]
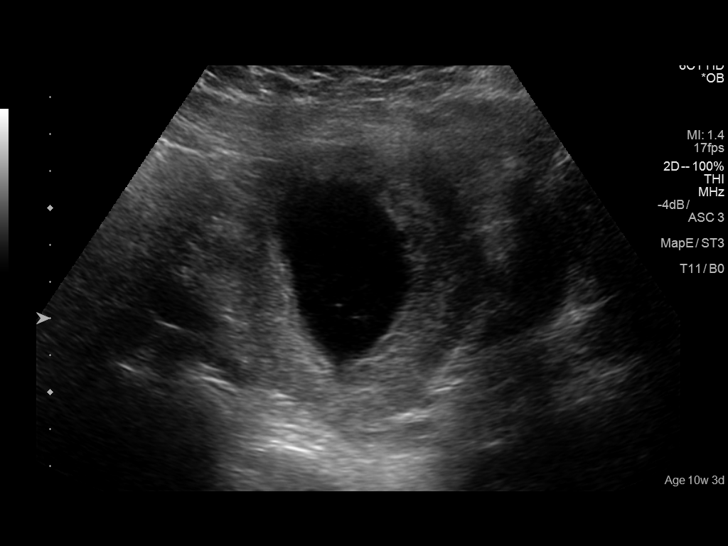
[im 7/61]
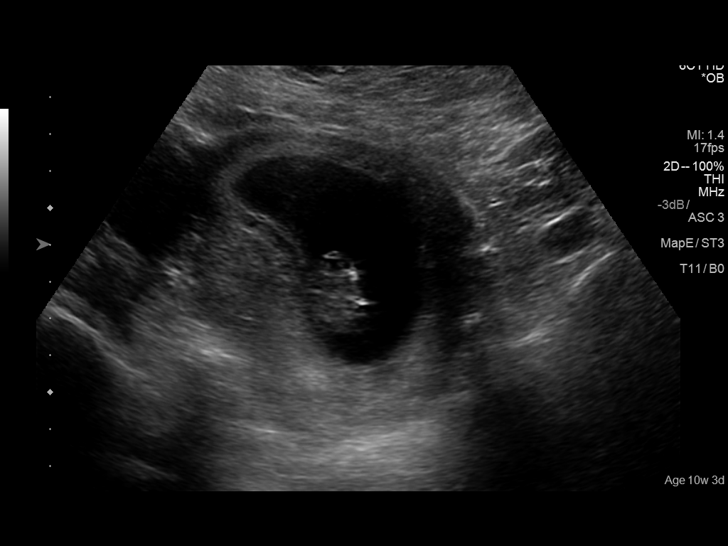
[im 12/61]
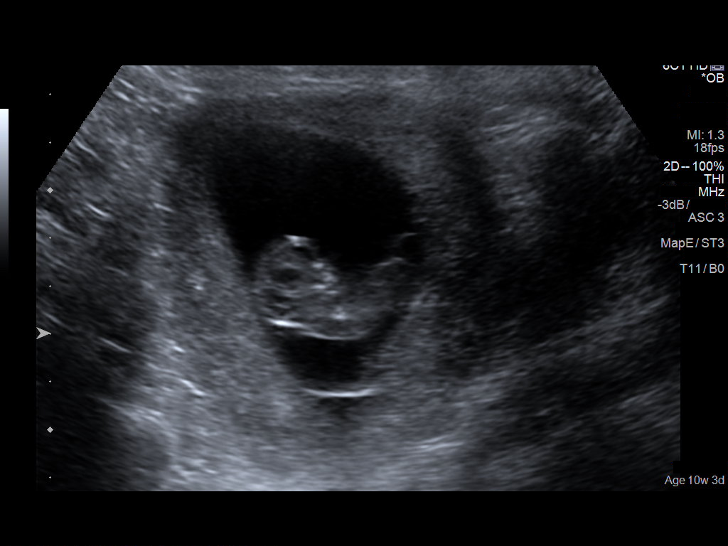
[im 16/61]
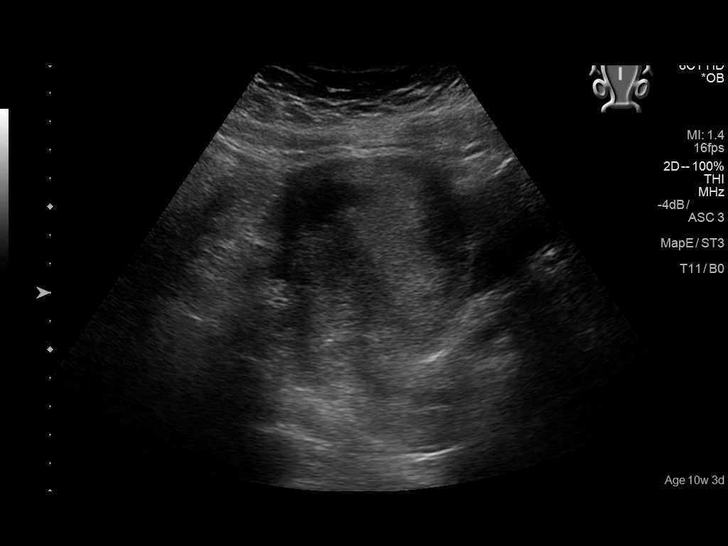
[im 21/61]
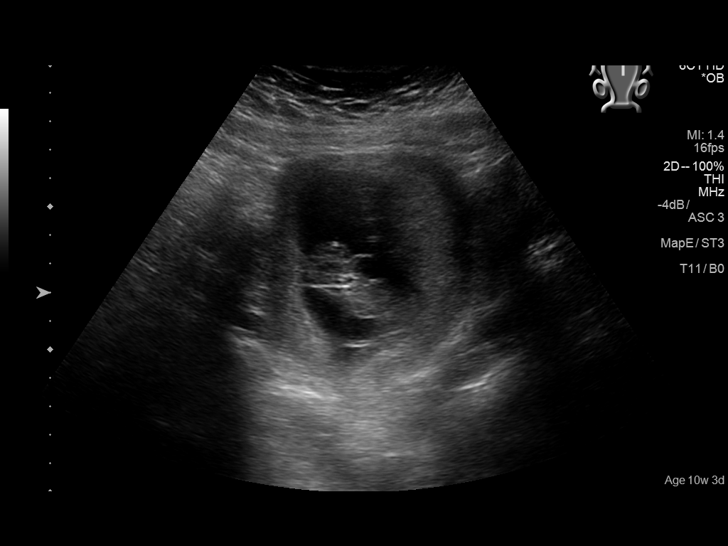
[im 25/61]
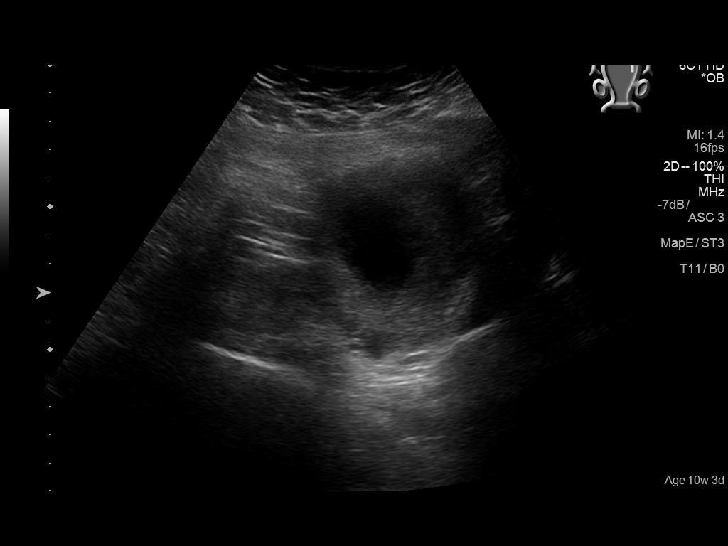
[im 29/61]
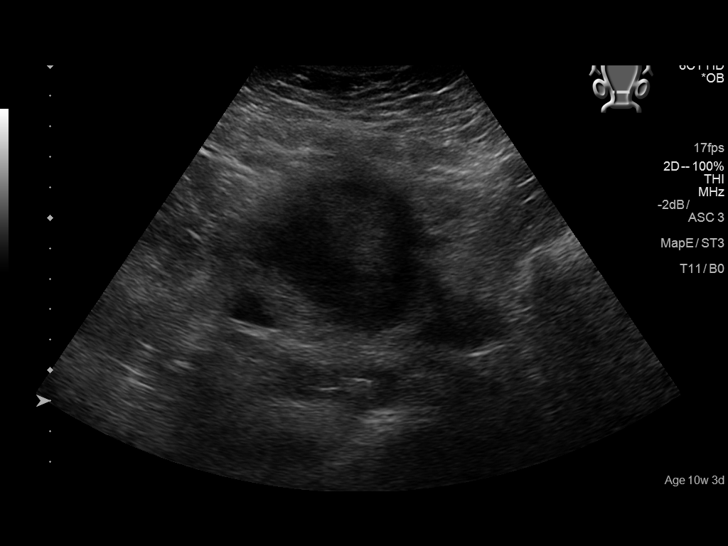
[im 34/61]
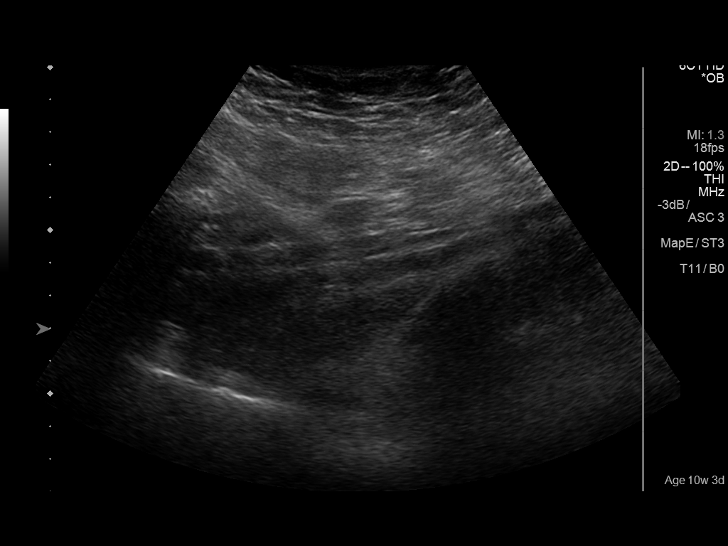
[im 38/61]
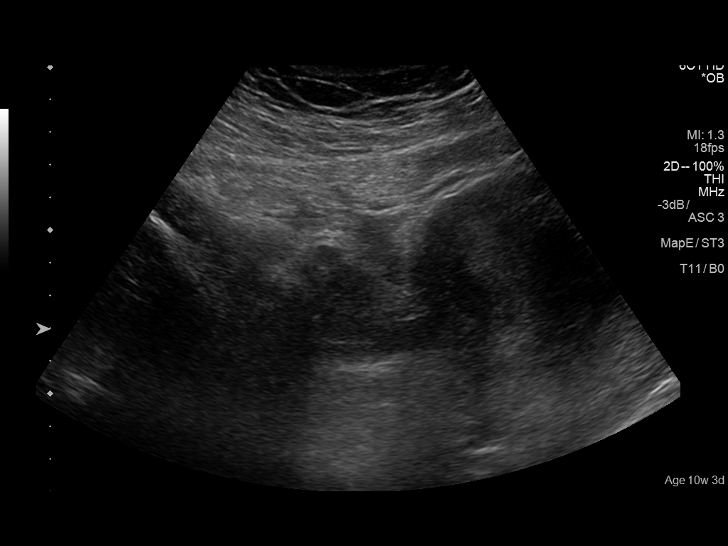
[im 43/61]
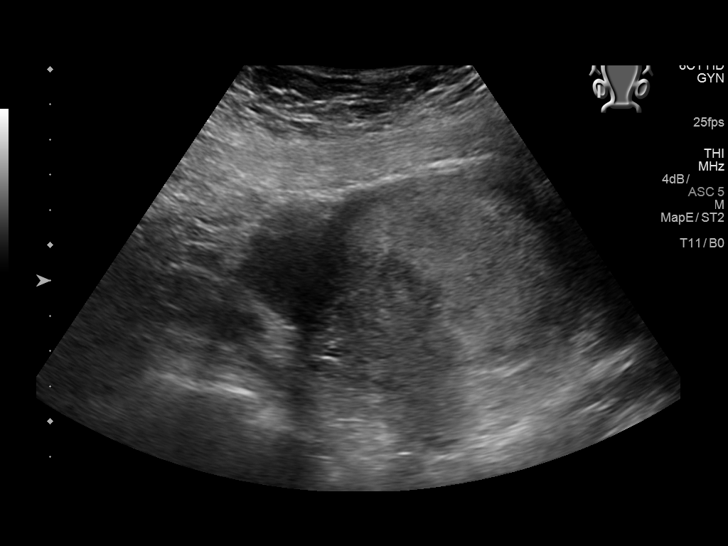
[im 47/61]
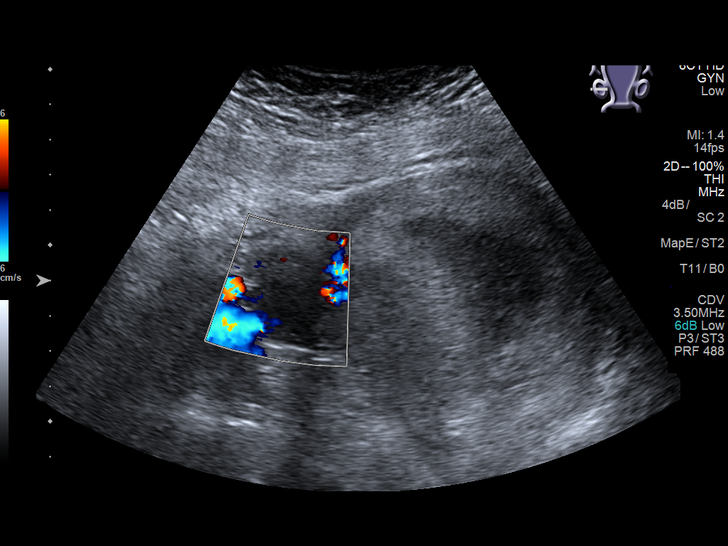
[im 52/61]
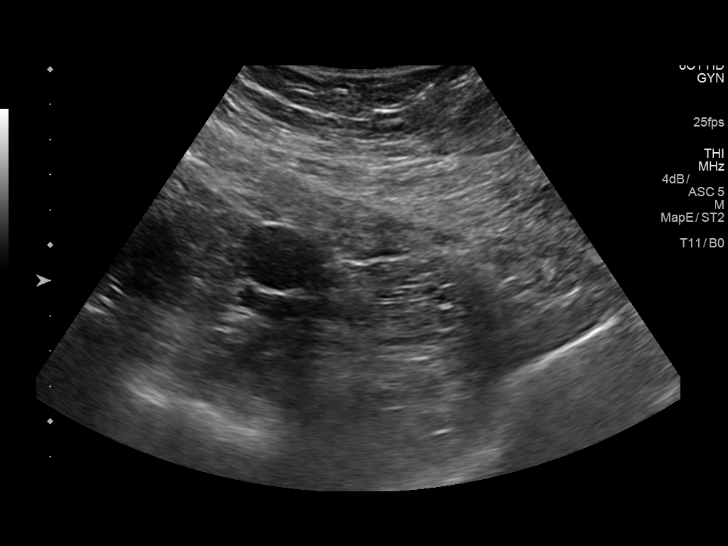
[im 56/61]
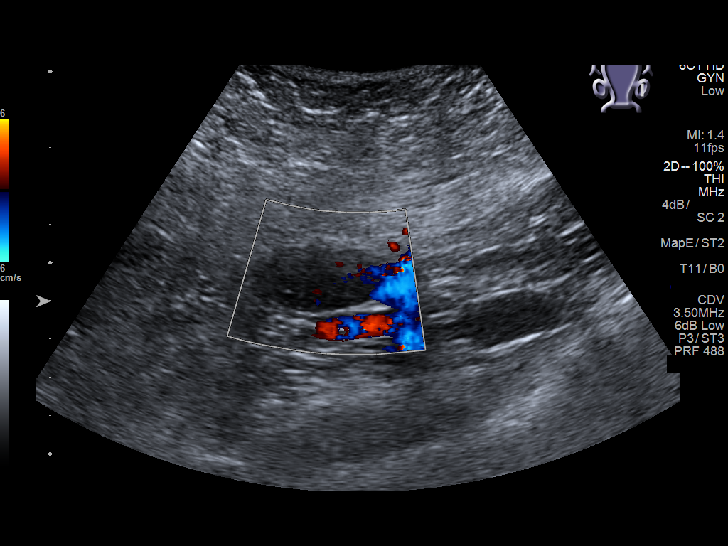
[im 61/61]
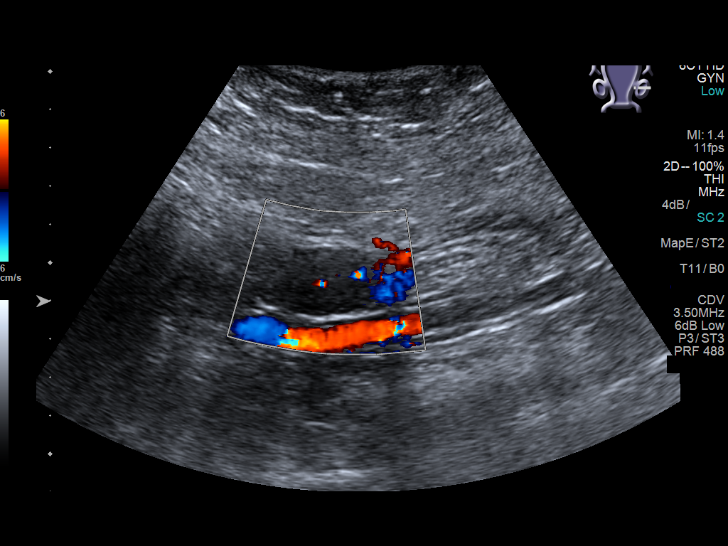

[14 of 28 positions shown; findings below may reference images not displayed]

FINDINGS: Intrauterine gestational sac: Single

Yolk sac:  Not Visualized.

Embryo:  Visualized.

Cardiac Activity: Visualized.

Heart Rate: 158  bpm

CRL:  35.8  mm   10 w   3 d                  US EDC: 05/24/2017

Subchorionic hemorrhage:  None visualized.

Maternal uterus/adnexae: Bilateral ovaries are within normal limits.

No free fluid.
IMPRESSION: Single live intrauterine gestation, with estimated gestational age
10 weeks 3 days by crown-rump length.

## 2018-03-18 ENCOUNTER — Ambulatory Visit (INDEPENDENT_AMBULATORY_CARE_PROVIDER_SITE_OTHER): Payer: Medicaid Other | Admitting: Advanced Practice Midwife

## 2018-03-18 ENCOUNTER — Encounter: Payer: Self-pay | Admitting: Advanced Practice Midwife

## 2018-03-18 VITALS — BP 124/68 | Wt 258.0 lb

## 2018-03-18 DIAGNOSIS — Z3A34 34 weeks gestation of pregnancy: Secondary | ICD-10-CM

## 2018-03-18 DIAGNOSIS — O0990 Supervision of high risk pregnancy, unspecified, unspecified trimester: Secondary | ICD-10-CM

## 2018-03-18 NOTE — Progress Notes (Signed)
Routine Prenatal Care Visit  Subjective  Lisa House is a 27 y.o. [phone] at [redacted]w[redacted]d being seen today for ongoing prenatal care.  She is currently monitored for the following issues for this high-risk pregnancy and has IBS (irritable bowel syndrome); Obesity in pregnancy, antepartum; OCD (obsessive compulsive disorder); Bilateral polycystic ovarian syndrome; Psoriasis; Allergic rhinitis; History of depression; Herpes genitalis; BMI 40.0-44.9, adult (HCC); Anemia affecting pregnancy in third trimester; Supervision of high risk pregnancy, antepartum; Umbilical cord cyst during pregnancy, antepartum; Family history of congenital aortic stenosis; and Bacterial vaginosis on their problem list.  ----------------------------------------------------------------------------------- Patient reports fatigue.  She is taking an extra iron supplement. She has started valtrex daily and is reminded to take it 2x/day. Contractions: Not present. Vag. Bleeding: None.  Movement: Present. Denies leaking of fluid.  ----------------------------------------------------------------------------------- The following portions of the patient's history were reviewed and updated as appropriate: allergies, current medications, past family history, past medical history, past social history, past surgical history and problem list. Problem list updated.   Objective  Blood pressure 124/68, weight 258 lb (117 kg), last menstrual period 07/20/2017, unknown if currently breastfeeding. Pregravid weight 239 lb (108.4 kg) Total Weight Gain 19 lb (8.618 kg) Urinalysis: Urine Protein: Negative Urine Glucose: Negative  Fetal Status: Fetal Heart Rate (bpm): 127   Movement: Present     General:  Alert, oriented and cooperative. Patient is in no acute distress.  Skin: Skin is warm and dry. No rash noted.   Cardiovascular: Normal heart rate noted  Respiratory: Normal respiratory effort, no problems with respiration noted  Abdomen: Soft,  gravid, appropriate for gestational age. Pain/Pressure: Present     Pelvic:  Cervical exam deferred        Extremities: Normal range of motion.  Edema: None  Mental Status: Normal mood and affect. Normal behavior. Normal judgment and thought content.   Assessment   27 y.o. [id number] at [redacted]w[redacted]d by  04/29/2018, by Ultrasound presenting for routine prenatal visit  Plan   Pregnancy #4 Problems (from 07/20/17 to present)    Problem Noted Resolved   Supervision of high risk pregnancy, antepartum 09/23/2017 by Farrel Conners, [name] No   Overview Addendum 01/16/2018  7:23 PM by Farrel Conners, [name]     Clinic [facility] Prenatal Labs  Dating 8wk5d ultrasound Blood type: O/Positive/-- (11/13 1057)   Genetic Screen AFP:     NIPS:  per pt request Antibody:Negative (11/13 1057)  Anatomic [place]  Rubella: 4.16 (11/13 1057)/ Varicella NI  GTT Early:   91            Third trimester:  RPR: Non Reactive (11/13 1057)   Flu vaccine  Declined  HBsAg: Negative (11/13 1057)   TDaP vaccine                                               Rhogam: HIV: non reactive 11/13  Baby Food   Breast                                            GBS:  Contraception  Mirena Pap:  Circumcision    Pediatrician    Support Person     Umbilical cysts seen at 8wk5d         Umbilical cord  cyst during pregnancy, antepartum 09/23/2017 by Farrel Conners, [name] No   Overview Addendum 10/21/2017 11:36 AM by Conard Novak, MD    Seen at [phone]- follow up with ultrasound in second trimester. If cysts persist consult MFM and genetics. - No evidence on 12/ week ultrasound. Follow up at 20 week ultrasound      BMI 40.0-44.9, adult (HCC) 02/27/2017 by Conard Novak, MD No   Overview Signed 10/21/2017 11:37 AM by Conard Novak, MD    BMI >=40  early 1h gtt -   u/s for dating    nutritional goals  folic acid   bASA (>12 weeks)  consider nutrition consult  consider maternal EKG 1st  trimester  Growth u/s 28 , 32 , 36 weeks   NST/AFI weekly 36+ weeks (36[] , 37[] , 38[] , 39[] , 40[] )  IOL by 41 weeks (scheduled, prn )       Herpes genitalis 02/08/2017 by Farrel Conners, [name] No   Overview Addendum 04/30/2017 10:14 AM by Conard Novak, MD     Needs PPX at 36 weeks * lesion noted at 36-week visit, very small, upper right intralabial fold.  Cultured and negative for HSV.  Still need to make sure resolved before attempting vaginal delivery.      Obesity in pregnancy, antepartum 05/09/2015 by [name], MD No   Overview Signed 02/08/2017  4:06 PM by Farrel Conners, [name]    BMI>40. Need monthly growth scans. Start weekly antepartum testing at 36 weeks. Early glucola=82      History of depression 05/09/2015 by [name], MD No       Preterm labor symptoms and general obstetric precautions including but not limited to vaginal bleeding, contractions, leaking of fluid and fetal movement were reviewed in detail with the patient.    Return in about 2 weeks (around 04/01/2018) for afi/nst/rob.  Tresea Mall, [name] 03/18/2018 8:54 AM

## 2018-03-18 NOTE — Progress Notes (Signed)
No vb. No lof.  

## 2018-04-01 ENCOUNTER — Ambulatory Visit (INDEPENDENT_AMBULATORY_CARE_PROVIDER_SITE_OTHER): Payer: Medicaid Other | Admitting: Obstetrics and Gynecology

## 2018-04-01 ENCOUNTER — Ambulatory Visit (INDEPENDENT_AMBULATORY_CARE_PROVIDER_SITE_OTHER): Payer: Medicaid Other

## 2018-04-01 ENCOUNTER — Encounter: Payer: Self-pay | Admitting: Obstetrics and Gynecology

## 2018-04-01 VITALS — BP 128/62 | Wt 260.0 lb

## 2018-04-01 DIAGNOSIS — O0993 Supervision of high risk pregnancy, unspecified, third trimester: Secondary | ICD-10-CM | POA: Diagnosis not present

## 2018-04-01 DIAGNOSIS — Z3685 Encounter for antenatal screening for Streptococcus B: Secondary | ICD-10-CM

## 2018-04-01 DIAGNOSIS — A6000 Herpesviral infection of urogenital system, unspecified: Secondary | ICD-10-CM

## 2018-04-01 DIAGNOSIS — O99213 Obesity complicating pregnancy, third trimester: Secondary | ICD-10-CM | POA: Diagnosis not present

## 2018-04-01 DIAGNOSIS — O0990 Supervision of high risk pregnancy, unspecified, unspecified trimester: Secondary | ICD-10-CM

## 2018-04-01 DIAGNOSIS — O99210 Obesity complicating pregnancy, unspecified trimester: Secondary | ICD-10-CM

## 2018-04-01 DIAGNOSIS — Z3A36 36 weeks gestation of pregnancy: Secondary | ICD-10-CM

## 2018-04-01 LAB — OB RESULTS CONSOLE GBS
GBS: NEGATIVE
STREP GROUP B AG: NEGATIVE

## 2018-04-01 MED ORDER — NYSTATIN 100000 UNIT/GM EX CREA: 1.0000 "application " | TOPICAL_CREAM | Freq: Two times a day (BID) | CUTANEOUS | 1 refills | Status: DC

## 2018-04-01 NOTE — Progress Notes (Signed)
Routine Prenatal Care Visit  Subjective  Lisa House is a 27 y.o. [phone] at [redacted]w[redacted]d being seen today for ongoing prenatal care.  She is currently monitored for the following issues for this high-risk pregnancy and has IBS (irritable bowel syndrome); Obesity in pregnancy, antepartum; OCD (obsessive compulsive disorder); Bilateral polycystic ovarian syndrome; Psoriasis; Allergic rhinitis; History of depression; Herpes genitalis; BMI 40.0-44.9, adult (HCC); Anemia affecting pregnancy in third trimester; Supervision of high risk pregnancy, antepartum; Umbilical cord cyst during pregnancy, antepartum; and Family history of congenital aortic stenosis on their problem list.  ----------------------------------------------------------------------------------- Patient reports no complaints.   Contractions: Irregular. Vag. Bleeding: None.  Movement: Present. Denies leaking of fluid.  ----------------------------------------------------------------------------------- The following portions of the patient's history were reviewed and updated as appropriate: allergies, current medications, past family history, past medical history, past social history, past surgical history and problem list. Problem list updated.   Objective  Blood pressure 128/62, weight 260 lb (117.9 kg), last menstrual period 07/20/2017, unknown if currently breastfeeding. Pregravid weight 239 lb (108.4 kg) Total Weight Gain 21 lb (9.526 kg)  Body mass index is 43.27 kg/m.  Urinalysis:      Fetal Status: Fetal Heart Rate (bpm): 140 Fundal Height: 37 cm Movement: Present  Presentation: Complete Breech  General:  Alert, oriented and cooperative. Patient is in no acute distress.  Skin: Skin is warm and dry. No rash noted.   Cardiovascular: Normal heart rate noted  Respiratory: Normal respiratory effort, no problems with respiration noted  Abdomen: Soft, gravid, appropriate for gestational age. Pain/Pressure: Present     Pelvic:   Cervical exam performed Dilation: 1.5 Effacement (%): 50 Station: -3  Extremities: Normal range of motion.     ental Status: Normal mood and affect. Normal behavior. Normal judgment and thought content.     Assessment   27 y.o. [id number] at 107w0d by  04/29/2018, by Ultrasound presenting for routine prenatal visit  Plan   Pregnancy #4 Problems (from 07/20/17 to present)    Problem Noted Resolved   Supervision of high risk pregnancy, antepartum 09/23/2017 by Farrel Conners, [name] No   Overview Addendum 01/16/2018  7:23 PM by Farrel Conners, [name]     Clinic [facility] Prenatal Labs  Dating 8wk5d ultrasound Blood type: O/Positive/-- (11/13 1057)   Genetic Screen AFP:     NIPS:  per pt request Antibody:Negative (11/13 1057)  Anatomic [place]  Rubella: 4.16 (11/13 1057)/ Varicella NI  GTT Early:   91            Third trimester:  RPR: Non Reactive (11/13 1057)   Flu vaccine  Declined  HBsAg: Negative (11/13 1057)   TDaP vaccine                                               Rhogam: HIV: non reactive 11/13  Baby Food   Breast                                            GBS:  Contraception  Mirena Pap:  Circumcision    Pediatrician    Support Person     Umbilical cysts seen at 8wk5d         Umbilical cord cyst during pregnancy, antepartum 09/23/2017 by  Farrel Conners, [name] No   Overview Addendum 10/21/2017 11:36 AM by Conard Novak, MD    Seen at [phone]- follow up with ultrasound in second trimester. If cysts persist consult MFM and genetics. - No evidence on 12/ week ultrasound. Follow up at 20 week ultrasound      BMI 40.0-44.9, adult (HCC) 02/27/2017 by Conard Novak, MD No   Overview Signed 10/21/2017 11:37 AM by Conard Novak, MD    BMI >=40  early 1h gtt -   u/s for dating    nutritional goals  folic acid   bASA (>12 weeks)  consider nutrition consult  consider maternal EKG 1st trimester  Growth u/s 28 , 32 , 36 weeks [  ]  NST/AFI weekly 36+ weeks (36[] , 37[] , 38[] , 39[] , 40[] )  IOL by 41 weeks (scheduled, prn )       Herpes genitalis 02/08/2017 by Farrel Conners, [name] No   Overview Addendum 04/30/2017 10:14 AM by Conard Novak, MD     Needs PPX at 36 weeks * lesion noted at 36-week visit, very small, upper right intralabial fold.  Cultured and negative for HSV.  Still need to make sure resolved before attempting vaginal delivery.      Obesity in pregnancy, antepartum 05/09/2015 by [name], MD No   Overview Signed 02/08/2017  4:06 PM by Farrel Conners, [name]    BMI>40. Need monthly growth scans. Start weekly antepartum testing at 36 weeks. Early glucola=82      History of depression 05/09/2015 by [name], MD No       Gestational age appropriate obstetric precautions including but not limited to vaginal bleeding, contractions, leaking of fluid and fetal movement were reviewed in detail with the patient.   - start NST next week - growth scan today 7lbs 5oz c/w 78%ile - breech version next week 04/08/18 at 10:00 AM - is on valtrex  Return in about 5 days (around 04/06/2018) for NST.  Vena Austria, MD, [facility], [facility] 04/01/2018, 10:59 AM

## 2018-04-01 NOTE — Progress Notes (Signed)
ROB Growth scan GBS today

## 2018-04-02 ENCOUNTER — Telehealth: Payer: Self-pay

## 2018-04-02 NOTE — Telephone Encounter (Signed)
Sent in RX for prenatal vitamins

## 2018-04-02 NOTE — Telephone Encounter (Signed)
Trying to call pt to f/u on medication from appt yesterday. Was disconnected once pt answered. Please let me know when she calls back.

## 2018-04-03 LAB — STREP GP B NAA: STREP GROUP B AG: NEGATIVE

## 2018-04-06 ENCOUNTER — Encounter: Payer: Medicaid Other | Admitting: Certified Nurse Midwife

## 2018-04-09 ENCOUNTER — Encounter: Payer: Self-pay | Admitting: Maternal Newborn

## 2018-04-09 ENCOUNTER — Ambulatory Visit (INDEPENDENT_AMBULATORY_CARE_PROVIDER_SITE_OTHER): Payer: Medicaid Other | Admitting: Maternal Newborn

## 2018-04-09 VITALS — BP 120/64 | Wt 258.0 lb

## 2018-04-09 DIAGNOSIS — Z3A37 37 weeks gestation of pregnancy: Secondary | ICD-10-CM

## 2018-04-09 DIAGNOSIS — Z369 Encounter for antenatal screening, unspecified: Secondary | ICD-10-CM

## 2018-04-09 DIAGNOSIS — O0993 Supervision of high risk pregnancy, unspecified, third trimester: Secondary | ICD-10-CM

## 2018-04-09 DIAGNOSIS — O0990 Supervision of high risk pregnancy, unspecified, unspecified trimester: Secondary | ICD-10-CM

## 2018-04-09 NOTE — Progress Notes (Signed)
C/o AMS said we could do a bedside scan today to see if baby has turned since pt didn't want to have aversion done d/t baby shower this weekend. rj

## 2018-04-09 NOTE — Progress Notes (Addendum)
Routine Prenatal Care Visit  Subjective  Lisa House is a 27 y.o. [phone] at [redacted]w[redacted]d being seen today for ongoing prenatal care.  She is currently monitored for the following issues for this high-risk pregnancy and has IBS (irritable bowel syndrome); Obesity in pregnancy, antepartum; OCD (obsessive compulsive disorder); Bilateral polycystic ovarian syndrome; Psoriasis; Allergic rhinitis; History of depression; Herpes genitalis; BMI 40.0-44.9, adult (HCC); Anemia affecting pregnancy in third trimester; Supervision of high risk pregnancy, antepartum; Umbilical cord cyst during pregnancy, antepartum; and Family history of congenital aortic stenosis on their problem list.  ----------------------------------------------------------------------------------- Patient reports no complaints.   Contractions: Irregular. Vag. Bleeding: None.  Movement: Present. No leaking of fluid.  ----------------------------------------------------------------------------------- The following portions of the patient's history were reviewed and updated as appropriate: allergies, current medications, past family history, past medical history, past social history, past surgical history and problem list. Problem list updated.   Objective  Blood pressure 120/64, weight 258 lb (117 kg), last menstrual period 07/20/2017, unknown if currently breastfeeding. Pregravid weight 239 lb (108.4 kg) Total Weight Gain 19 lb (8.618 kg) Urinalysis: Urine Protein: Negative Urine Glucose: Negative  Fetal Status: Fetal Heart Rate (bpm): 140 Fundal Height: 38 cm Movement: Present  Presentation: Complete Breech  General:  Alert, oriented and cooperative. Patient is in no acute distress.  Skin: Skin is warm and dry. No rash noted.   Cardiovascular: Normal heart rate noted  Respiratory: Normal respiratory effort, no problems with respiration noted  Abdomen: Soft, gravid, appropriate for gestational age. Pain/Pressure: Present       Pelvic:  Cervical exam deferred        Extremities: Normal range of motion.  Edema: Trace  Mental Status: Normal mood and affect. Normal behavior. Normal judgment and thought content.   NST Baseline: 140 Variability: moderate Accelerations: present Decelerations: absent Tocometry: none The patient was monitored for 20+ minutes, fetal heart rate tracing was deemed reactive, category I tracing.  Baby is breech on BSUS.  Assessment   26 y.o. [id number] at [redacted]w[redacted]d, EDD 04/29/2018 by Ultrasound presenting for routine prenatal visit.  Plan   Pregnancy #4 Problems (from 07/20/17 to present)    Problem Noted Resolved   Supervision of high risk pregnancy, antepartum 09/23/2017 by Farrel Conners, [name] No   Overview Addendum 01/16/2018  7:23 PM by Farrel Conners, [name]     Clinic [facility] Prenatal Labs  Dating 8wk5d ultrasound Blood type: O/Positive/-- (11/13 1057)   Genetic Screen AFP:     NIPS:  per pt request Antibody:Negative (11/13 1057)  Anatomic [place]  Rubella: 4.16 (11/13 1057)/ Varicella NI  GTT Early:   91            Third trimester:  RPR: Non Reactive (11/13 1057)   Flu vaccine  Declined  HBsAg: Negative (11/13 1057)   TDaP vaccine                                               Rhogam: HIV: non reactive 11/13  Baby Food   Breast                                            GBS:  Contraception  Mirena Pap:  Circumcision    Pediatrician    Support  Person     Umbilical cysts seen at 8wk5d         Umbilical cord cyst during pregnancy, antepartum 09/23/2017 by Farrel Conners, [name] No   Overview Addendum 10/21/2017 11:36 AM by Conard Novak, MD    Seen at [phone]- follow up with ultrasound in second trimester. If cysts persist consult MFM and genetics. - No evidence on 12/ week ultrasound. Follow up at 20 week ultrasound      BMI 40.0-44.9, adult (HCC) 02/27/2017 by Conard Novak, MD No   Overview Signed 10/21/2017 11:37 AM by Conard Novak, MD    BMI >=40   early 1h gtt -   u/s for dating    nutritional goals  folic acid   bASA (>12 weeks)  consider nutrition consult  consider maternal EKG 1st trimester  Growth u/s 28 , 32 , 36 weeks   NST/AFI weekly 36+ weeks (36[] , 37[] , 38[] , 39[] , 40[] )  IOL by 41 weeks (scheduled, prn )       Herpes genitalis 02/08/2017 by Farrel Conners, [name] No   Overview Addendum 04/30/2017 10:14 AM by Conard Novak, MD     Needs PPX at 36 weeks * lesion noted at 36-week visit, very small, upper right intralabial fold.  Cultured and negative for HSV.  Still need to make sure resolved before attempting vaginal delivery.      Obesity in pregnancy, antepartum 05/09/2015 by [name], MD No   Overview Signed 02/08/2017  4:06 PM by Farrel Conners, [name]    BMI>40. Need monthly growth scans. Start weekly antepartum testing at 36 weeks. Early glucola=82      History of depression 05/09/2015 by [name], MD No    Reactive NST. Discussed ECV vs. Cesarean delivery for breech presentation. Agreed to attempt ECV. Will call with available time.   Preterm labor symptoms and general obstetric precautions including but not limited to vaginal bleeding, contractions, leaking of fluid and fetal movement were reviewedt.  Return in about 1 week (around 04/16/2018) for ROB with NST/AFI.  Marcelyn Bruins, [name] 04/09/2018  3:11 PM

## 2018-04-12 ENCOUNTER — Encounter: Payer: Self-pay | Admitting: Obstetrics and Gynecology

## 2018-04-15 ENCOUNTER — Other Ambulatory Visit: Payer: Self-pay

## 2018-04-15 ENCOUNTER — Observation Stay
Admission: EM | Admit: 2018-04-15 | Discharge: 2018-04-15 | Disposition: A | Discharge status: Home / Self Care | Payer: Medicaid Other | Attending: Obstetrics & Gynecology | Admitting: Obstetrics & Gynecology

## 2018-04-15 ENCOUNTER — Encounter: Payer: Self-pay | Admitting: *Deleted

## 2018-04-15 ENCOUNTER — Other Ambulatory Visit: Payer: Medicaid Other

## 2018-04-15 ENCOUNTER — Encounter: Payer: Medicaid Other | Admitting: Obstetrics and Gynecology

## 2018-04-15 DIAGNOSIS — F410 Panic disorder [episodic paroxysmal anxiety] without agoraphobia: Secondary | ICD-10-CM | POA: Diagnosis not present

## 2018-04-15 DIAGNOSIS — Z8659 Personal history of other mental and behavioral disorders: Secondary | ICD-10-CM

## 2018-04-15 DIAGNOSIS — Z9049 Acquired absence of other specified parts of digestive tract: Secondary | ICD-10-CM | POA: Diagnosis not present

## 2018-04-15 DIAGNOSIS — O99613 Diseases of the digestive system complicating pregnancy, third trimester: Secondary | ICD-10-CM | POA: Insufficient documentation

## 2018-04-15 DIAGNOSIS — O99210 Obesity complicating pregnancy, unspecified trimester: Secondary | ICD-10-CM

## 2018-04-15 DIAGNOSIS — L409 Psoriasis, unspecified: Secondary | ICD-10-CM | POA: Diagnosis not present

## 2018-04-15 DIAGNOSIS — O0993 Supervision of high risk pregnancy, unspecified, third trimester: Secondary | ICD-10-CM | POA: Insufficient documentation

## 2018-04-15 DIAGNOSIS — O99343 Other mental disorders complicating pregnancy, third trimester: Secondary | ICD-10-CM | POA: Diagnosis not present

## 2018-04-15 DIAGNOSIS — K219 Gastro-esophageal reflux disease without esophagitis: Secondary | ICD-10-CM | POA: Insufficient documentation

## 2018-04-15 DIAGNOSIS — L309 Dermatitis, unspecified: Secondary | ICD-10-CM | POA: Diagnosis not present

## 2018-04-15 DIAGNOSIS — O99013 Anemia complicating pregnancy, third trimester: Secondary | ICD-10-CM | POA: Insufficient documentation

## 2018-04-15 DIAGNOSIS — O321XX0 Maternal care for breech presentation, not applicable or unspecified: Secondary | ICD-10-CM | POA: Diagnosis not present

## 2018-04-15 DIAGNOSIS — O99283 Endocrine, nutritional and metabolic diseases complicating pregnancy, third trimester: Secondary | ICD-10-CM | POA: Diagnosis not present

## 2018-04-15 DIAGNOSIS — F329 Major depressive disorder, single episode, unspecified: Secondary | ICD-10-CM | POA: Diagnosis not present

## 2018-04-15 DIAGNOSIS — Z79899 Other long term (current) drug therapy: Secondary | ICD-10-CM | POA: Diagnosis not present

## 2018-04-15 DIAGNOSIS — K589 Irritable bowel syndrome without diarrhea: Secondary | ICD-10-CM | POA: Insufficient documentation

## 2018-04-15 DIAGNOSIS — O99213 Obesity complicating pregnancy, third trimester: Secondary | ICD-10-CM | POA: Diagnosis not present

## 2018-04-15 DIAGNOSIS — Z3A38 38 weeks gestation of pregnancy: Secondary | ICD-10-CM | POA: Diagnosis not present

## 2018-04-15 DIAGNOSIS — Z6841 Body Mass Index (BMI) 40.0 and over, adult: Secondary | ICD-10-CM | POA: Insufficient documentation

## 2018-04-15 DIAGNOSIS — O0990 Supervision of high risk pregnancy, unspecified, unspecified trimester: Secondary | ICD-10-CM

## 2018-04-15 DIAGNOSIS — L304 Erythema intertrigo: Secondary | ICD-10-CM | POA: Insufficient documentation

## 2018-04-15 DIAGNOSIS — A6000 Herpesviral infection of urogenital system, unspecified: Secondary | ICD-10-CM

## 2018-04-15 DIAGNOSIS — O368990 Maternal care for other specified fetal problems, unspecified trimester, not applicable or unspecified: Secondary | ICD-10-CM

## 2018-04-15 DIAGNOSIS — J310 Chronic rhinitis: Secondary | ICD-10-CM | POA: Insufficient documentation

## 2018-04-15 DIAGNOSIS — F429 Obsessive-compulsive disorder, unspecified: Secondary | ICD-10-CM | POA: Insufficient documentation

## 2018-04-15 DIAGNOSIS — O99713 Diseases of the skin and subcutaneous tissue complicating pregnancy, third trimester: Secondary | ICD-10-CM | POA: Diagnosis not present

## 2018-04-15 DIAGNOSIS — Z8249 Family history of ischemic heart disease and other diseases of the circulatory system: Secondary | ICD-10-CM | POA: Insufficient documentation

## 2018-04-15 LAB — CBC
HCT: 29.2 % — ABNORMAL LOW (ref 35.0–47.0)
Hemoglobin: 9.9 g/dL — ABNORMAL LOW (ref 12.0–16.0)
MCH: 24.4 pg — AB (ref 26.0–34.0)
MCHC: 33.7 g/dL (ref 32.0–36.0)
MCV: 72.3 fL — AB (ref 80.0–100.0)
PLATELETS: 163 10*3/uL (ref 150–440)
RBC: 4.04 MIL/uL (ref 3.80–5.20)
RDW: 21.1 % — AB (ref 11.5–14.5)
WBC: 8.7 10*3/uL (ref 3.6–11.0)

## 2018-04-15 LAB — TYPE AND SCREEN
ABO/RH(D): O POS
Antibody Screen: NEGATIVE

## 2018-04-15 MED ORDER — TERBUTALINE SULFATE 1 MG/ML IJ SOLN
0.2500 mg | Freq: Once | INTRAMUSCULAR | Status: AC | PRN
  Administered 2018-04-15: 0.25 mg via INTRAVENOUS
  Filled 2018-04-15: qty 1

## 2018-04-15 MED ORDER — TERBUTALINE SULFATE 1 MG/ML IJ SOLN: 0.2500 mg | Freq: Once | INTRAMUSCULAR | Status: DC | PRN

## 2018-04-15 NOTE — Discharge Instructions (Signed)
Braxton Hicks Contractions °Contractions of the uterus can occur throughout pregnancy, but they are not always a sign that you are in labor. You may have practice contractions called Braxton Hicks contractions. These false labor contractions are sometimes confused with true labor. °What are Braxton Hicks contractions? °Braxton Hicks contractions are tightening movements that occur in the muscles of the uterus before labor. Unlike true labor contractions, these contractions do not result in opening (dilation) and thinning of the cervix. Toward the end of pregnancy (32-34 weeks), Braxton Hicks contractions can happen more often and may become stronger. These contractions are sometimes difficult to tell apart from true labor because they can be very uncomfortable. You should not feel embarrassed if you go to the hospital with false labor. °Sometimes, the only way to tell if you are in true labor is for your health care provider to look for changes in the cervix. The health care provider will do a physical exam and may monitor your contractions. If you are not in true labor, the exam should show that your cervix is not dilating and your water has not broken. °If there are other health problems associated with your pregnancy, it is completely safe for you to be sent home with false labor. You may continue to have Braxton Hicks contractions until you go into true labor. °How to tell the difference between true labor and false labor °True labor °· Contractions last 30-70 seconds. °· Contractions become very regular. °· Discomfort is usually felt in the top of the uterus, and it spreads to the lower abdomen and low back. °· Contractions do not go away with walking. °· Contractions usually become more intense and increase in frequency. °· The cervix dilates and gets thinner. °False labor °· Contractions are usually shorter and not as strong as true labor contractions. °· Contractions are usually irregular. °· Contractions  are often felt in the front of the lower abdomen and in the groin. °· Contractions may go away when you walk around or change positions while lying down. °· Contractions get weaker and are shorter-lasting as time goes on. °· The cervix usually does not dilate or become thin. °Follow these instructions at home: °· Take over-the-counter and prescription medicines only as told by your health care provider. °· Keep up with your usual exercises and follow other instructions from your health care provider. °· Eat and drink lightly if you think you are going into labor. °· If Braxton Hicks contractions are making you uncomfortable: °? Change your position from lying down or resting to walking, or change from walking to resting. °? Sit and rest in a tub of warm water. °? Drink enough fluid to keep your urine pale yellow. Dehydration may cause these contractions. °? Do slow and deep breathing several times an hour. °· Keep all follow-up prenatal visits as told by your health care provider. This is important. °Contact a health care provider if: °· You have a fever. °· You have continuous pain in your abdomen. °Get help right away if: °· Your contractions become stronger, more regular, and closer together. °· You have fluid leaking or gushing from your vagina. °· You pass blood-tinged mucus (bloody show). °· You have bleeding from your vagina. °· You have low back pain that you never had before. °· You feel your baby’s head pushing down and causing pelvic pressure. °· Your baby is not moving inside you as much as it used to. °Summary °· Contractions that occur before labor are called Braxton   Hicks contractions, false labor, or practice contractions. °· Braxton Hicks contractions are usually shorter, weaker, farther apart, and less regular than true labor contractions. True labor contractions usually become progressively stronger and regular and they become more frequent. °· Manage discomfort from Braxton Hicks contractions by  changing position, resting in a warm bath, drinking plenty of water, or practicing deep breathing. °This information is not intended to replace advice given to you by your health care provider. Make sure you discuss any questions you have with your health care provider. °Document Released: 03/12/2017 Document Revised: 03/12/2017 Document Reviewed: 03/12/2017 °Elsevier Interactive Patient Education © 2018 [employer] Inc. ° °

## 2018-04-15 NOTE — Discharge Summary (Signed)
  Gynecology Physician Postoperative Discharge Summary  Patient ID: Lisa House MRN: [id number] DOB/AGE: [birth year] 27 y.o.  Admit Date: 04/15/2018 Discharge Date: 04/15/2018  PreDiagnoses: Breech  Procedures: ECV  Significant Labs: CBC Latest Ref Rng & Units 04/15/2018 02/05/2018 09/22/2017  WBC 3.6 - 11.0 K/uL 8.7 9.1 8.0  Hemoglobin 12.0 - 16.0 g/dL 4.0(J9.9(L) 8.9(L) 11.0(L)  Hematocrit 35.0 - 47.0 % 29.2(L) 27.8(L) 34.0  Platelets 150 - 440 K/uL 163 180 233    Hospital Course:  Lisa House is a 27 y.o. [id number]  admitted for scheduled procedure.  She underwent the procedures as mentioned above, with successful version to vertex. For further details about procedure, please refer to the procedurenote. Patient had an uncomplicated post-procedure course. By time of discharge, her pain was controlled on oral pain medications; she has reactive NST. She was deemed stable for discharge to home.   A NST procedure was performed with FHR monitoring and a normal baseline established, appropriate time of 20-40 minutes of evaluation, and accels >2 seen w 15x15 characteristics.  Results show a REACTIVE NST.   Repeat Ultrasound reveals vertex presentation.  Discharge Exam: Blood pressure 128/68, pulse 93, temperature 98.2 F (36.8 C), temperature source Oral, resp. rate 18, height 5\' 6"  (1.676 m), weight 256 lb (116.1 kg), last menstrual period 07/20/2017, SpO2 99 %, unknown if currently breastfeeding. General appearance: alert and no distress  Resp: clear to auscultation bilaterally  Cardio: regular rate and rhythm  GI: gravid, NT; bowel sounds normal; no masses, no organomegaly.  Extremities: extremities normal, atraumatic, no cyanosis or edema and Homans sign is negative, no sign of DVT  Discharged Condition: Stable  Disposition: Discharge disposition: 01-Home or Self Care       Discharge Instructions    Call MD for:   Complete by:  As directed    Worsening contractions or pain;  leakage of fluid; bleeding.   Diet general   Complete by:  As directed    Increase activity slowly   Complete by:  As directed      Allergies as of 04/15/2018   No Known Allergies     Medication List    TAKE these medications   CITRANATAL BLOOM 90-1 MG Tabs Take 1 tablet by mouth daily.   ferrous sulfate 325 (65 FE) MG tablet Commonly known as:  FERROUSUL Take 1 tablet (325 mg total) by mouth daily with breakfast.   nystatin cream Commonly known as:  MYCOSTATIN Apply 1 application topically 2 (two) times daily.   ranitidine 150 MG tablet Commonly known as:  ZANTAC Take 1 tablet (150 mg total) by mouth 2 (two) times daily as needed for heartburn.   valACYclovir 500 MG tablet Commonly known as:  VALTREX Take 1 tablet (500 mg total) by mouth daily.      Follow-up Information    Nadara MustardHarris, Jazarah Capili P, MD. Go in 5 day(s).   Specialty:  Obstetrics and Gynecology Contact information: [address] [place] [address] [phone]           Annamarie MajorPaul Tristyn Pharris, MD

## 2018-04-15 NOTE — H&P (Signed)
Obstetrics Admission History & Physical   CC: BREECH  HPI:  27 y.o. [id number] @ 3772w0d (04/29/2018, by Ultrasound). Admitted on 04/15/2018:   Patient Active Problem List   Diagnosis Date Noted  . Indication for care in labor and delivery, antepartum 04/15/2018  . Supervision of high risk pregnancy, antepartum 09/23/2017  . Umbilical cord cyst during pregnancy, antepartum 09/23/2017  . Family history of congenital aortic stenosis 09/23/2017  . Anemia affecting pregnancy in third trimester 09/08/2017  . BMI 40.0-44.9, adult (HCC) 02/27/2017  . Herpes genitalis 02/08/2017  . IBS (irritable bowel syndrome) 05/09/2015  . Obesity in pregnancy, antepartum 05/09/2015  . OCD (obsessive compulsive disorder) 05/09/2015  . Bilateral polycystic ovarian syndrome 05/09/2015  . Psoriasis 05/09/2015  . History of depression 05/09/2015  . Allergic rhinitis 05/02/2010    Presents for 38 week breech presentation; no prior breech and all prior NSVD at term.  Prefers ECV to try to have vertex and vaginal delivery.   [facility] Pregnancy complicated by none.  ROS: A review of systems was performed and negative, except as stated in the above HPI. PMHx:  Past Medical History:  Diagnosis Date  . Allergy   . Depression   . Eczema   . GERD (gastroesophageal reflux disease)   . Intertrigo   . Obesity   . OCD (obsessive compulsive disorder)   . Panic attack    PSHx:  Past Surgical History:  Procedure Laterality Date  . CHOLECYSTECTOMY  01/09/2011   Dr. Excell Seltzerooper   Medications:  Medications Prior to Admission  Medication Sig Dispense Refill Last Dose  . ferrous sulfate (FERROUSUL) 325 (65 FE) MG tablet Take 1 tablet (325 mg total) by mouth daily with breakfast. 30 tablet 4 Unknown at Unknown time  . nystatin cream (MYCOSTATIN) Apply 1 application topically 2 (two) times daily. 30 g 1 Unknown at Unknown time  . Prenatal-DSS-FeCb-FeGl-FA (CITRANATAL BLOOM) 90-1 MG TABS Take 1 tablet by  mouth daily. 30 tablet 5 Unknown at Unknown time  . ranitidine (ZANTAC) 150 MG tablet Take 1 tablet (150 mg total) by mouth 2 (two) times daily as needed for heartburn. 60 tablet 4 Unknown at Unknown time  . valACYclovir (VALTREX) 500 MG tablet Take 1 tablet (500 mg total) by mouth daily. 30 tablet 2 Unknown at Unknown time   Allergies: has No Known Allergies. OBHx:  OB History  Gravida Para Term Preterm AB Living  4 3 3     3   SAB TAB Ectopic Multiple Live Births        0 3    # Outcome Date GA Lbr Len/2nd Weight Sex Delivery Anes PTL Lv  4 Current           3 Term 05/24/17 477w1d / 00:16 8 lb 5 oz (3.77 kg) F Vag-Spont EPI  LIV  2 Term 11/20/14 2220w0d  7 lb 4 oz (3.289 kg) M Vag-Spont   LIV  1 Term 02/20/10 2620w0d  8 lb 15 oz (4.054 kg) M Vag-Spont   LIV     Birth Comments: aortic stenosis     Complications: Low amniotic fluid    Obstetric Comments  All labors were induced   JYN:WGNFAOZH/YQMVHQIONGEXFHx:Negative/unremarkable except as detailed in HPI.Marland Kitchen.  No family history of birth defects. Soc Hx: Alcohol: none and Recreational drug use: none  Objective:  There were no vitals filed for this visit. Constitutional: Well nourished, well developed female in no acute distress.  HEENT: normal Skin: Warm and dry.  Cardiovascular:Regular  rate and rhythm.   Extremity: trace to 1+ bilateral pedal edema Respiratory: Clear to auscultation bilateral. Normal respiratory effort Abdomen: gravid, NT, breech Back: no CVAT Neuro: DTRs 2+, Cranial nerves grossly intact Psych: Alert and Oriented x3. No memory deficits. Normal mood and affect.  MS: normal gait, normal bilateral lower extremity ROM/strength/stability. FHT 140s   Perinatal info:  Blood type: O positive Rubella- Immune Varicella -Not immune TDaP Given during third trimester of this pregnancy RPR NR / HIV Neg/ HBsAg Neg   [place]- Frank Breech presentation, Fundal placenta, Adequate AFI  Assessment & Plan:   27 y.o. [id number] @ [redacted]w[redacted]d, Admitted on 04/15/2018:       ECV pros and cons discussed.  CS a soption for breech or for failed ECV.  Risks include pain, abruption, emergency CS, injury.  Pt prefers ECV attempt.  Will use terbuatline for muscle relaxation.  [place] guidfed procedure planned.  Annamarie Major, MD, [facility], [facility] 04/15/2018  12:54 PM

## 2018-04-15 NOTE — Progress Notes (Signed)
ECV  After informed consent, including reviewing the potential risks of pain, failure to turn fetus,  rupture of membranes, uterine abruption, bleeding and fetal death.  Patient opted to proceed with the procedure.  Ultrasound at bedside confirms breech presentation. Terbutaline 0.25 mg SQ was given  ECV was attempted under Ultrasound guidance.   The version was successful.  FHR was 120 and reactive before and after the procedure.  Toco showed mild uterine irritability. Pt. Tolerated the procedure well.  There were no complications and the pt will be discharged to home with abdominal binder after  1 -2 hours of fetal and uterine monitoring.  Annamarie MajorPaul Harris, MD, Merlinda FrederickFACOG [facility], [facility] 04/15/2018  2:59 PM

## 2018-04-15 NOTE — OB Triage Note (Signed)
Scheduled version for breech presentation.

## 2018-04-16 LAB — RPR: RPR: NONREACTIVE

## 2018-04-20 ENCOUNTER — Ambulatory Visit (INDEPENDENT_AMBULATORY_CARE_PROVIDER_SITE_OTHER): Payer: Medicaid Other

## 2018-04-20 ENCOUNTER — Ambulatory Visit (INDEPENDENT_AMBULATORY_CARE_PROVIDER_SITE_OTHER): Payer: Medicaid Other | Admitting: Obstetrics and Gynecology

## 2018-04-20 VITALS — BP 124/68 | Wt 260.0 lb

## 2018-04-20 DIAGNOSIS — O0993 Supervision of high risk pregnancy, unspecified, third trimester: Secondary | ICD-10-CM

## 2018-04-20 DIAGNOSIS — O99013 Anemia complicating pregnancy, third trimester: Secondary | ICD-10-CM | POA: Diagnosis not present

## 2018-04-20 DIAGNOSIS — O98313 Other infections with a predominantly sexual mode of transmission complicating pregnancy, third trimester: Secondary | ICD-10-CM

## 2018-04-20 DIAGNOSIS — Z369 Encounter for antenatal screening, unspecified: Secondary | ICD-10-CM

## 2018-04-20 DIAGNOSIS — Z3A38 38 weeks gestation of pregnancy: Secondary | ICD-10-CM

## 2018-04-20 DIAGNOSIS — O0990 Supervision of high risk pregnancy, unspecified, unspecified trimester: Secondary | ICD-10-CM

## 2018-04-20 DIAGNOSIS — Z3483 Encounter for supervision of other normal pregnancy, third trimester: Secondary | ICD-10-CM

## 2018-04-20 DIAGNOSIS — A6000 Herpesviral infection of urogenital system, unspecified: Secondary | ICD-10-CM

## 2018-04-20 DIAGNOSIS — O321XX0 Maternal care for breech presentation, not applicable or unspecified: Secondary | ICD-10-CM

## 2018-04-20 DIAGNOSIS — O99213 Obesity complicating pregnancy, third trimester: Secondary | ICD-10-CM | POA: Diagnosis not present

## 2018-04-20 DIAGNOSIS — O99210 Obesity complicating pregnancy, unspecified trimester: Secondary | ICD-10-CM

## 2018-04-20 NOTE — Progress Notes (Signed)
ROB NST/AFI 

## 2018-04-20 NOTE — Progress Notes (Signed)
Routine Prenatal Care Visit  Subjective  Lisa House is a 27 y.o. [phone] at [redacted]w[redacted]d being seen today for ongoing prenatal care.  She is currently monitored for the following issues for this high-risk pregnancy and has IBS (irritable bowel syndrome); Obesity in pregnancy, antepartum; OCD (obsessive compulsive disorder); Bilateral polycystic ovarian syndrome; Psoriasis; Allergic rhinitis; History of depression; Herpes genitalis; BMI 40.0-44.9, adult (HCC); Anemia affecting pregnancy in third trimester; Supervision of high risk pregnancy, antepartum; Umbilical cord cyst during pregnancy, antepartum; Family history of congenital aortic stenosis; Indication for care in labor and delivery, antepartum; and Breech malpresentation successfully converted to cephalic presentation on their problem list.  ----------------------------------------------------------------------------------- Patient reports no complaints.   Contractions: Not present. Vag. Bleeding: None.  Movement: Present. Denies leaking of fluid.  ----------------------------------------------------------------------------------- The following portions of the patient's history were reviewed and updated as appropriate: allergies, current medications, past family history, past medical history, past social history, past surgical history and problem list. Problem list updated.   Objective  Blood pressure 124/68, weight 260 lb (117.9 kg), last menstrual period 07/20/2017, unknown if currently breastfeeding. Pregravid weight 239 lb (108.4 kg) Total Weight Gain 21 lb (9.526 kg) Urinalysis: Urine Protein: Negative Urine Glucose: Negative  Fetal Status: Fetal Heart Rate (bpm): 125   Movement: Present  Presentation: Vertex  General:  Alert, oriented and cooperative. Patient is in no acute distress.  Skin: Skin is warm and dry. No rash noted.   Cardiovascular: Normal heart rate noted  Respiratory: Normal respiratory effort, no problems with  respiration noted  Abdomen: Soft, gravid, appropriate for gestational age. Pain/Pressure: Present     Pelvic:  Cervical exam deferred        Extremities: Normal range of motion.     ental Status: Normal mood and affect. Normal behavior. Normal judgment and thought content.   Baseline: 125  Variability: moderate Accelerations: present Decelerations: absent Tocometry: N/A The patient was monitored for 30 minutes, fetal heart rate tracing was deemed reactive, category I tracing,  Assessment   27 y.o. [id number] at 100w5d by  04/29/2018, by Ultrasound presenting for routine prenatal visit  Plan   Pregnancy #4 Problems (from 07/20/17 to present)    Problem Noted Resolved   Breech malpresentation successfully converted to cephalic presentation 04/15/2018 by Nadara Mustard, MD No   Supervision of high risk pregnancy, antepartum 09/23/2017 by Farrel Conners, [name] No   Overview Addendum 01/16/2018  7:23 PM by Farrel Conners, [name]     Clinic [facility] Prenatal Labs  Dating 8wk5d ultrasound Blood type: O/Positive/-- (11/13 1057)   Genetic Screen AFP:     NIPS: [ ]  per pt request Antibody:Negative (11/13 1057)  Anatomic [place]  Rubella: 4.16 (11/13 1057)/ Varicella NI  GTT Early:   91            Third trimester:  RPR: Non Reactive (11/13 1057)   Flu vaccine  Declined  HBsAg: Negative (11/13 1057)   TDaP vaccine                                               Rhogam: HIV: non reactive 11/13  Baby Food   Breast  GBS:  Contraception  Mirena Pap:  Circumcision    Pediatrician    Support Person     Umbilical cysts seen at 8wk5d         Umbilical cord cyst during pregnancy, antepartum 09/23/2017 by [name], [name], [name] No   Overview Addendum 10/21/2017 11:36 AM by Conard NovakJackson, Stephen D, MD    Seen at [phone]- follow up with ultrasound in second trimester. If cysts persist consult MFM and genetics. - No evidence on 12/ week ultrasound. Follow up at 20 week  ultrasound      BMI 40.0-44.9, adult (HCC) 02/27/2017 by Conard NovakJackson, Stephen D, MD No   Overview Signed 10/21/2017 11:37 AM by Conard NovakJackson, Stephen D, MD    BMI >=40 [ ]  early 1h gtt -  [ ]  u/s for dating [ ]   [ ]  nutritional goals [ ]  folic acid 1mg  [ ]  bASA (>12 weeks) [ ]  consider nutrition consult [ ]  consider maternal EKG 1st trimester [ ]  Growth u/s 28 [ ] , 32 [ ] , 36 weeks [ ]  [ ]  NST/AFI weekly 36+ weeks (36[] , 37[] , 38[] , 39[] , 40[] ) [ ]  IOL by 41 weeks (scheduled, prn [] )       Herpes genitalis 02/08/2017 by [name], [name], [name] No   Overview Addendum 04/30/2017 10:14 AM by Conard NovakJackson, Stephen D, MD    [x]  Needs PPX at 36 weeks * lesion noted at 36-week visit, very small, upper right intralabial fold.  Cultured and negative for HSV.  Still need to make sure resolved before attempting vaginal delivery.      Obesity in pregnancy, antepartum 05/09/2015 by Alba CorySowles, Krichna, MD No   Overview Signed 02/08/2017  4:06 PM by [name], [name], [name]    BMI>40. Need monthly growth scans. Start weekly antepartum testing at 36 weeks. Early glucola=82      History of depression 05/09/2015 by Alba CorySowles, Krichna, MD No       Gestational age appropriate obstetric precautions including but not limited to vaginal bleeding, contractions, leaking of fluid and fetal movement were reviewed in detail with the patient.    Return in about 1 week (around 04/27/2018) for rob/afi/nst.  Vena AustriaAndreas Ichael Pullara, MD, Merlinda FrederickFACOG [facility], [facility] 04/20/2018, 10:17 AM

## 2018-04-26 ENCOUNTER — Other Ambulatory Visit: Payer: Self-pay | Admitting: Certified Nurse Midwife

## 2018-04-27 ENCOUNTER — Encounter: Payer: Self-pay | Admitting: Advanced Practice Midwife

## 2018-04-27 ENCOUNTER — Ambulatory Visit (INDEPENDENT_AMBULATORY_CARE_PROVIDER_SITE_OTHER): Payer: Medicaid Other

## 2018-04-27 ENCOUNTER — Ambulatory Visit (INDEPENDENT_AMBULATORY_CARE_PROVIDER_SITE_OTHER): Payer: Medicaid Other | Admitting: Advanced Practice Midwife

## 2018-04-27 VITALS — BP 110/62 | Wt 262.0 lb

## 2018-04-27 DIAGNOSIS — O0993 Supervision of high risk pregnancy, unspecified, third trimester: Secondary | ICD-10-CM | POA: Diagnosis not present

## 2018-04-27 DIAGNOSIS — O99013 Anemia complicating pregnancy, third trimester: Secondary | ICD-10-CM | POA: Diagnosis not present

## 2018-04-27 DIAGNOSIS — Z3A39 39 weeks gestation of pregnancy: Secondary | ICD-10-CM

## 2018-04-27 DIAGNOSIS — B009 Herpesviral infection, unspecified: Secondary | ICD-10-CM | POA: Diagnosis not present

## 2018-04-27 DIAGNOSIS — O0990 Supervision of high risk pregnancy, unspecified, unspecified trimester: Secondary | ICD-10-CM

## 2018-04-27 LAB — FETAL NONSTRESS TEST

## 2018-04-27 MED ORDER — VALACYCLOVIR HCL 500 MG PO TABS
ORAL_TABLET | ORAL | 2 refills | Status: DC
Start: 2018-04-27 — End: 2018-05-04

## 2018-04-27 NOTE — Progress Notes (Signed)
AFI/NST-no complaints

## 2018-04-27 NOTE — Progress Notes (Addendum)
Routine Prenatal Care Visit  Subjective  Lisa House is a 27 y.o. [phone] at [redacted]w[redacted]d being seen today for ongoing prenatal care.  She is currently monitored for the following issues for this high-risk pregnancy and has IBS (irritable bowel syndrome); Obesity in pregnancy, antepartum; OCD (obsessive compulsive disorder); Bilateral polycystic ovarian syndrome; Psoriasis; Allergic rhinitis; History of depression; Herpes genitalis; BMI 40.0-44.9, adult (HCC); Anemia affecting pregnancy in third trimester; Supervision of high risk pregnancy, antepartum; Umbilical cord cyst during pregnancy, antepartum; Family history of congenital aortic stenosis; Indication for care in labor and delivery, antepartum; and Breech malpresentation successfully converted to cephalic presentation on their problem list.  ----------------------------------------------------------------------------------- Patient reports no complaints.  Needs refill of valtrex rx. Contractions: Not present. Vag. Bleeding: None.  Movement: Present. Denies leaking of fluid.  ----------------------------------------------------------------------------------- The following portions of the patient's history were reviewed and updated as appropriate: allergies, current medications, past family history, past medical history, past social history, past surgical history and problem list. Problem list updated.   Objective  Blood pressure 110/62, weight 262 lb (118.8 kg), last menstrual period 07/20/2017 Pregravid weight 239 lb (108.4 kg) Total Weight Gain 23 lb (10.4 kg) Urinalysis: Urine Protein: Negative Urine Glucose: Negative  Fetal Status: Fetal Heart Rate (bpm): 130   Movement: Present  Presentation: Vertex  AFI: 10.53, NST 20 minute tracing is reactive with 130 bpm baseline, moderate variability, +accelerations, -decelerations  General:  Alert, oriented and cooperative. Patient is in no acute distress.  Skin: Skin is warm and dry. No rash  noted.   Cardiovascular: Normal heart rate noted  Respiratory: Normal respiratory effort, no problems with respiration noted  Abdomen: Soft, gravid, appropriate for gestational age. Pain/Pressure: Present     Pelvic:  Cervical exam performed Dilation: 1.5 Effacement (%): 50 Station: -2  Extremities: Normal range of motion.  Edema: Trace  Mental Status: Normal mood and affect. Normal behavior. Normal judgment and thought content.   Assessment   27 y.o. [id number] at [redacted]w[redacted]d by  04/29/2018, by Ultrasound presenting for routine prenatal visit  Plan   Pregnancy #4 Problems (from 07/20/17 to present)    Problem Noted Resolved   Breech malpresentation successfully converted to cephalic presentation 04/15/2018 by Nadara Mustard, MD No   Supervision of high risk pregnancy, antepartum 09/23/2017 by Farrel Conners, [name] No   Overview Addendum 01/16/2018  7:23 PM by Farrel Conners, [name]     Clinic [facility] Prenatal Labs  Dating 8wk5d ultrasound Blood type: O/Positive/-- (11/13 1057)   Genetic Screen AFP:     NIPS: [ ]  per pt request Antibody:Negative (11/13 1057)  Anatomic [place]  Rubella: 4.16 (11/13 1057)/ Varicella NI  GTT Early:   91            Third trimester:  RPR: Non Reactive (11/13 1057)   Flu vaccine  Declined  HBsAg: Negative (11/13 1057)   TDaP vaccine                                               Rhogam: HIV: non reactive 11/13  Baby Food   Breast                                            GBS:  Contraception  Mirena Pap:  Circumcision    Pediatrician    Support Person     Umbilical cysts seen at 8wk5d         Umbilical cord cyst during pregnancy, antepartum 09/23/2017 by [name], [name], [name] No   Overview Addendum 10/21/2017 11:36 AM by Conard NovakJackson, Stephen D, MD    Seen at [phone]- follow up with ultrasound in second trimester. If cysts persist consult MFM and genetics. - No evidence on 12/ week ultrasound. Follow up at 20 week ultrasound      BMI 40.0-44.9, adult (HCC)  02/27/2017 by Conard NovakJackson, Stephen D, MD No   Overview Signed 10/21/2017 11:37 AM by Conard NovakJackson, Stephen D, MD    BMI >=40 [ ]  early 1h gtt -  [ ]  u/s for dating [ ]   [ ]  nutritional goals [ ]  folic acid 1mg  [ ]  bASA (>12 weeks) [ ]  consider nutrition consult [ ]  consider maternal EKG 1st trimester [ ]  Growth u/s 28 [ ] , 32 [ ] , 36 weeks [ ]  [ ]  NST/AFI weekly 36+ weeks (36[] , 37[] , 38[] , 39[] , 40[] ) [ ]  IOL by 41 weeks (scheduled, prn [] )       Herpes genitalis 02/08/2017 by [name], [name], [name] No   Overview Addendum 04/30/2017 10:14 AM by Conard NovakJackson, Stephen D, MD    [x]  Needs PPX at 36 weeks * lesion noted at 36-week visit, very small, upper right intralabial fold.  Cultured and negative for HSV.  Still need to make sure resolved before attempting vaginal delivery.      Obesity in pregnancy, antepartum 05/09/2015 by Alba CorySowles, Krichna, MD No   Overview Signed 02/08/2017  4:06 PM by [name], [name], [name]    BMI>40. Need monthly growth scans. Start weekly antepartum testing at 36 weeks. Early glucola=82      History of depression 05/09/2015 by Alba CorySowles, Krichna, MD No       Term labor symptoms and general obstetric precautions including but not limited to vaginal bleeding, contractions, leaking of fluid and fetal movement were reviewed in detail with the patient. IOL scheduled for 8 AM on 6/26  Return for will have induction next week.  Tresea MallJane Ivadell Gaul, [name] 04/27/2018 9:24 AM

## 2018-05-02 ENCOUNTER — Inpatient Hospital Stay: Payer: Medicaid Other | Admitting: Anesthesiology

## 2018-05-02 ENCOUNTER — Inpatient Hospital Stay
Admission: EM | Admit: 2018-05-02 | Discharge: 2018-05-04 | DRG: 806 | Disposition: A | Discharge status: Home / Self Care | Payer: Medicaid Other | Attending: Obstetrics and Gynecology | Admitting: Obstetrics and Gynecology

## 2018-05-02 ENCOUNTER — Encounter: Payer: Self-pay | Admitting: Obstetrics and Gynecology

## 2018-05-02 ENCOUNTER — Other Ambulatory Visit: Payer: Self-pay

## 2018-05-02 DIAGNOSIS — E669 Obesity, unspecified: Secondary | ICD-10-CM | POA: Diagnosis present

## 2018-05-02 DIAGNOSIS — O99210 Obesity complicating pregnancy, unspecified trimester: Secondary | ICD-10-CM

## 2018-05-02 DIAGNOSIS — O0990 Supervision of high risk pregnancy, unspecified, unspecified trimester: Secondary | ICD-10-CM

## 2018-05-02 DIAGNOSIS — Z3A40 40 weeks gestation of pregnancy: Secondary | ICD-10-CM

## 2018-05-02 DIAGNOSIS — A6000 Herpesviral infection of urogenital system, unspecified: Secondary | ICD-10-CM

## 2018-05-02 DIAGNOSIS — O9962 Diseases of the digestive system complicating childbirth: Secondary | ICD-10-CM | POA: Diagnosis present

## 2018-05-02 DIAGNOSIS — O9832 Other infections with a predominantly sexual mode of transmission complicating childbirth: Secondary | ICD-10-CM | POA: Diagnosis present

## 2018-05-02 DIAGNOSIS — O9081 Anemia of the puerperium: Secondary | ICD-10-CM | POA: Diagnosis not present

## 2018-05-02 DIAGNOSIS — K219 Gastro-esophageal reflux disease without esophagitis: Secondary | ICD-10-CM | POA: Diagnosis present

## 2018-05-02 DIAGNOSIS — O99214 Obesity complicating childbirth: Secondary | ICD-10-CM | POA: Diagnosis present

## 2018-05-02 DIAGNOSIS — Z8659 Personal history of other mental and behavioral disorders: Secondary | ICD-10-CM

## 2018-05-02 DIAGNOSIS — Z3483 Encounter for supervision of other normal pregnancy, third trimester: Secondary | ICD-10-CM | POA: Diagnosis present

## 2018-05-02 DIAGNOSIS — Z87891 Personal history of nicotine dependence: Secondary | ICD-10-CM | POA: Diagnosis not present

## 2018-05-02 DIAGNOSIS — D62 Acute posthemorrhagic anemia: Secondary | ICD-10-CM | POA: Diagnosis not present

## 2018-05-02 DIAGNOSIS — O368990 Maternal care for other specified fetal problems, unspecified trimester, not applicable or unspecified: Secondary | ICD-10-CM

## 2018-05-02 LAB — CBC
HCT: 30.6 % — ABNORMAL LOW (ref 35.0–47.0)
Hemoglobin: 10.3 g/dL — ABNORMAL LOW (ref 12.0–16.0)
MCH: 24.6 pg — AB (ref 26.0–34.0)
MCHC: 33.8 g/dL (ref 32.0–36.0)
MCV: 72.8 fL — ABNORMAL LOW (ref 80.0–100.0)
Platelets: 186 10*3/uL (ref 150–440)
RBC: 4.2 MIL/uL (ref 3.80–5.20)
RDW: 20.3 % — AB (ref 11.5–14.5)
WBC: 9.9 10*3/uL (ref 3.6–11.0)

## 2018-05-02 LAB — TYPE AND SCREEN
ABO/RH(D): O POS
Antibody Screen: NEGATIVE

## 2018-05-02 MED ORDER — AMMONIA AROMATIC IN INHA
RESPIRATORY_TRACT | Status: AC
  Filled 2018-05-02: qty 10

## 2018-05-02 MED ORDER — EPHEDRINE 5 MG/ML INJ: 10.0000 mg | INTRAVENOUS | Status: DC | PRN

## 2018-05-02 MED ORDER — OXYCODONE-ACETAMINOPHEN 5-325 MG PO TABS: 1.0000 | ORAL_TABLET | ORAL | Status: DC | PRN

## 2018-05-02 MED ORDER — OXYTOCIN 40 UNITS IN LACTATED RINGERS INFUSION - SIMPLE MED
1.0000 m[IU]/min | INTRAVENOUS | Status: DC
  Administered 2018-05-02: 2 m[IU]/min via INTRAVENOUS

## 2018-05-02 MED ORDER — LIDOCAINE-EPINEPHRINE (PF) 1.5 %-1:200000 IJ SOLN
INTRAMUSCULAR | Status: DC | PRN
  Administered 2018-05-02: 3 mL via EPIDURAL

## 2018-05-02 MED ORDER — LACTATED RINGERS IV SOLN
INTRAVENOUS | Status: DC
  Administered 2018-05-02: 17:00:00 via INTRAVENOUS

## 2018-05-02 MED ORDER — PHENYLEPHRINE 40 MCG/ML (10ML) SYRINGE FOR IV PUSH (FOR BLOOD PRESSURE SUPPORT): 80.0000 ug | PREFILLED_SYRINGE | INTRAVENOUS | Status: DC | PRN

## 2018-05-02 MED ORDER — EPHEDRINE 5 MG/ML INJ
10.0000 mg | Freq: Once | INTRAVENOUS | Status: AC
  Administered 2018-05-02: 10 mg via INTRAVENOUS

## 2018-05-02 MED ORDER — OXYTOCIN 10 UNIT/ML IJ SOLN
INTRAMUSCULAR | Status: AC
  Filled 2018-05-02: qty 2

## 2018-05-02 MED ORDER — IBUPROFEN 600 MG PO TABS
600.0000 mg | ORAL_TABLET | Freq: Four times a day (QID) | ORAL | Status: DC
  Administered 2018-05-03 – 2018-05-04 (×6): 600 mg via ORAL
  Filled 2018-05-02 (×6): qty 1

## 2018-05-02 MED ORDER — ACETAMINOPHEN 325 MG PO TABS
650.0000 mg | ORAL_TABLET | ORAL | Status: DC | PRN
Start: 2018-05-02 — End: 2018-05-04
  Administered 2018-05-03 (×3): 650 mg via ORAL
  Filled 2018-05-02 (×4): qty 2

## 2018-05-02 MED ORDER — LIDOCAINE HCL (PF) 1 % IJ SOLN
INTRAMUSCULAR | Status: DC | PRN
  Administered 2018-05-02: 3 mL via SUBCUTANEOUS

## 2018-05-02 MED ORDER — ONDANSETRON HCL 4 MG/2ML IJ SOLN: 4.0000 mg | INTRAMUSCULAR | Status: DC | PRN

## 2018-05-02 MED ORDER — OXYCODONE-ACETAMINOPHEN 5-325 MG PO TABS: 2.0000 | ORAL_TABLET | ORAL | Status: DC | PRN

## 2018-05-02 MED ORDER — DIPHENHYDRAMINE HCL 25 MG PO CAPS
25.0000 mg | ORAL_CAPSULE | Freq: Four times a day (QID) | ORAL | Status: DC | PRN
Start: 2018-05-02 — End: 2018-05-04
  Administered 2018-05-02: 25 mg via ORAL
  Filled 2018-05-02: qty 1

## 2018-05-02 MED ORDER — LACTATED RINGERS IV SOLN: 500.0000 mL | Freq: Once | INTRAVENOUS | Status: DC

## 2018-05-02 MED ORDER — ONDANSETRON HCL 4 MG/2ML IJ SOLN: 4.0000 mg | Freq: Four times a day (QID) | INTRAMUSCULAR | Status: DC | PRN

## 2018-05-02 MED ORDER — EPHEDRINE 5 MG/ML INJ
INTRAVENOUS | Status: AC
  Filled 2018-05-02: qty 4

## 2018-05-02 MED ORDER — TERBUTALINE SULFATE 1 MG/ML IJ SOLN: 0.2500 mg | Freq: Once | INTRAMUSCULAR | Status: DC | PRN

## 2018-05-02 MED ORDER — ONDANSETRON HCL 4 MG PO TABS: 4.0000 mg | ORAL_TABLET | ORAL | Status: DC | PRN

## 2018-05-02 MED ORDER — OXYTOCIN BOLUS FROM INFUSION
500.0000 mL | Freq: Once | INTRAVENOUS | Status: AC
  Administered 2018-05-02: 500 mL via INTRAVENOUS

## 2018-05-02 MED ORDER — DIPHENHYDRAMINE HCL 50 MG/ML IJ SOLN: 12.5000 mg | INTRAMUSCULAR | Status: DC | PRN

## 2018-05-02 MED ORDER — SODIUM CHLORIDE 0.9 % IV SOLN
INTRAVENOUS | Status: DC | PRN
  Administered 2018-05-02 (×3): 5 mL via EPIDURAL

## 2018-05-02 MED ORDER — LIDOCAINE HCL (PF) 1 % IJ SOLN: 30.0000 mL | INTRAMUSCULAR | Status: DC | PRN

## 2018-05-02 MED ORDER — LACTATED RINGERS IV SOLN
500.0000 mL | INTRAVENOUS | Status: DC | PRN
  Administered 2018-05-02: 500 mL via INTRAVENOUS

## 2018-05-02 MED ORDER — FENTANYL 2.5 MCG/ML W/ROPIVACAINE 0.15% IN NS 100 ML EPIDURAL (ARMC)
EPIDURAL | Status: AC
  Filled 2018-05-02: qty 100

## 2018-05-02 MED ORDER — ACETAMINOPHEN 325 MG PO TABS: 650.0000 mg | ORAL_TABLET | ORAL | Status: DC | PRN

## 2018-05-02 MED ORDER — FENTANYL 2.5 MCG/ML W/ROPIVACAINE 0.15% IN NS 100 ML EPIDURAL (ARMC): 12.0000 mL/h | EPIDURAL | Status: DC

## 2018-05-02 MED ORDER — LIDOCAINE HCL (PF) 1 % IJ SOLN
INTRAMUSCULAR | Status: AC
  Filled 2018-05-02: qty 30

## 2018-05-02 MED ORDER — MISOPROSTOL 25 MCG QUARTER TABLET
ORAL_TABLET | ORAL | Status: AC
  Filled 2018-05-02: qty 1

## 2018-05-02 MED ORDER — MISOPROSTOL 25 MCG QUARTER TABLET
25.0000 ug | ORAL_TABLET | ORAL | Status: DC | PRN
  Administered 2018-05-02: 25 ug via VAGINAL

## 2018-05-02 MED ORDER — OXYTOCIN 40 UNITS IN LACTATED RINGERS INFUSION - SIMPLE MED
2.5000 [IU]/h | INTRAVENOUS | Status: DC
  Filled 2018-05-02: qty 1000

## 2018-05-02 MED ORDER — MISOPROSTOL 200 MCG PO TABS
ORAL_TABLET | ORAL | Status: AC
  Filled 2018-05-02: qty 4

## 2018-05-02 MED ORDER — BUTORPHANOL TARTRATE 2 MG/ML IJ SOLN: 1.0000 mg | INTRAMUSCULAR | Status: DC | PRN

## 2018-05-02 MED ORDER — SOD CITRATE-CITRIC ACID 500-334 MG/5ML PO SOLN: 30.0000 mL | ORAL | Status: DC | PRN

## 2018-05-02 MED ORDER — FENTANYL 2.5 MCG/ML W/ROPIVACAINE 0.15% IN NS 100 ML EPIDURAL (ARMC)
EPIDURAL | Status: DC | PRN
  Administered 2018-05-02: 12 mL/h via EPIDURAL

## 2018-05-02 NOTE — Anesthesia Procedure Notes (Signed)
Epidural Patient location during procedure: OB Start time: 05/02/2018 5:44 PM End time: 05/02/2018 5:56 PM  Staffing Anesthesiologist: Lenard SimmerKarenz, Deshawn Witty, MD Performed: anesthesiologist   Preanesthetic Checklist Completed: patient identified, site marked, surgical consent, pre-op evaluation, timeout performed, IV checked, risks and benefits discussed and monitors and equipment checked  Epidural Patient position: sitting Prep: ChloraPrep Patient monitoring: heart rate, continuous pulse ox and blood pressure Approach: midline Location: L3-L4 Injection technique: LOR saline  Needle:  Needle type: Tuohy  Needle gauge: 17 G Needle length: 9 cm and 9 Needle insertion depth: 7 cm Catheter type: closed end flexible Catheter size: 19 Gauge Catheter at skin depth: 12 cm Test dose: negative and 1.5% lidocaine with Epi 1:200 K  Assessment Sensory level: T10 Events: blood not aspirated, injection not painful, no injection resistance, negative IV test and no paresthesia  Additional Notes 1st attempt Pt. Evaluated and documentation done after procedure finished. Patient identified. Risks/Benefits/Options discussed with patient including but not limited to bleeding, infection, nerve damage, paralysis, failed block, incomplete pain control, headache, blood pressure changes, nausea, vomiting, reactions to medication both or allergic, itching and postpartum back pain. Confirmed with bedside nurse the patient's most recent platelet count. Confirmed with patient that they are not currently taking any anticoagulation, have any bleeding history or any family history of bleeding disorders. Patient expressed understanding and wished to proceed. All questions were answered. Sterile technique was used throughout the entire procedure. Please see nursing notes for vital signs. Test dose was given through epidural catheter and negative prior to continuing to dose epidural or start infusion. Warning signs of high  block given to the patient including shortness of breath, tingling/numbness in hands, complete motor block, or any concerning symptoms with instructions to call for help. Patient was given instructions on fall risk and not to get out of bed. All questions and concerns addressed with instructions to call with any issues or inadequate analgesia.   Patient tolerated the insertion well without immediate complications.Reason for block:procedure for pain

## 2018-05-02 NOTE — Progress Notes (Addendum)
Speculum exam performed by Dr. Bonney AidStaebler, negative for lesions, OK to proceed with IOL for vaginal delivery.

## 2018-05-02 NOTE — Progress Notes (Addendum)
   Subjective:  Right sided discomfort/one sided epidural  Objective:   Vitals: Blood pressure 130/61, pulse 79, temperature 98.4 F (36.9 C), temperature source Oral, resp. rate 20, height 5\' 6"  (1.676 m), weight 262 lb (118.8 kg), last menstrual period 07/20/2017, SpO2 100 %, unknown if currently breastfeeding. General:  Abdomen: Cervical Exam:  Dilation: 8 Effacement (%): 80 Station: -1 Exam by:: R Fairley ROA  FHT: 125, moderate, no accels, deep variable Toco: q2-633min  Results for orders placed or performed during the hospital encounter of 05/02/18 (from the past 24 hour(s))  CBC     Status: Abnormal   Collection Time: 05/02/18  8:58 AM  Result Value Ref Range   WBC 9.9 3.6 - 11.0 K/uL   RBC 4.20 3.80 - 5.20 MIL/uL   Hemoglobin 10.3 (L) 12.0 - 16.0 g/dL   HCT 16.130.6 (L) 09.635.0 - 04.547.0 %   MCV 72.8 (L) 80.0 - 100.0 fL   MCH 24.6 (L) 26.0 - 34.0 pg   MCHC 33.8 32.0 - 36.0 g/dL   RDW 40.920.3 (H) 81.111.5 - 91.414.5 %   Platelets 186 150 - 440 K/uL  Type and screen [facility]     Status: None   Collection Time: 05/02/18  8:58 AM  Result Value Ref Range   ABO/RH(D) O POS    Antibody Screen NEG    Sample Expiration      05/05/2018 Performed at [facility], [address] [place], [place] [address]     Assessment:   27 y.o. [phone] 592w3d elective IOL  Plan:   1) Labor - AROM clear, stop pitocin until BP recovered and fetal recovery  2) Fetus - category II tracing - suspect secondary to hypotension following epidural administration, anesthesia at bedside to eval - good fetal scalp stim  - if continued decels while awaiting pitocin to wash out will administer terbutaline  Vena AustriaAndreas Verlia Kaney, MD, Merlinda FrederickFACOG [facility], [facility] 05/02/2018, 7:07 PM

## 2018-05-02 NOTE — H&P (Addendum)
Obstetric H&P   Chief Complaint: Scheduled Induction  Prenatal Care Provider: [facility]  History of Present Illness: 27 y.o. [id number] [redacted]w[redacted]d by 04/29/2018, by Ultrasound presenting to L&D for scheduled induction of labor.  +FM, no LOF, no VB.  Pregnancy notable for obesity, history of child with heart defect normal fetal echo this pregnancy.   Pregravid weight 239 lb (108.4 kg) Total Weight Gain 23 lb (10.4 kg)  Pregnancy #4 Problems (from 07/20/17 to 05/02/18)    Problem Noted Resolved   Supervision of high risk pregnancy, antepartum 09/23/2017 by Farrel Conners, [name] No   Overview Addendum 01/16/2018  7:23 PM by Farrel Conners, [name]     Clinic [facility] Prenatal Labs  Dating 8wk5d ultrasound Blood type: O/Positive/-- (11/13 1057)   Genetic Screen NIPS: Normal XY Antibody:Negative (11/13 1057)  Anatomic [place] Normal Female Rubella: 4.16 (11/13 1057)/ Varicella NI  GTT Early:   91            Third trimester: 87 RPR: Non Reactive (11/13 1057)   Flu vaccine  Declined  HBsAg: Negative (11/13 1057)   TDaP vaccine Needs                            Rhogam: N/A HIV: non reactive 11/13  Baby Food   Breast                                            GBS: Negative  Contraception  Mirena Pap: NEEDS  Circumcision    Pediatrician    Support Person     Umbilical cysts seen at 8wk5d         Umbilical cord cyst during pregnancy, antepartum 09/23/2017 by Farrel Conners, [name] No   Overview Addendum 10/21/2017 11:36 AM by Conard Novak, MD    Seen at [phone]- follow up with ultrasound in second trimester. If cysts persist consult MFM and genetics. - No evidence on 12/ week ultrasound. Follow up at 20 week ultrasound      Herpes genitalis 02/08/2017 by Farrel Conners, [name] No   Overview Addendum 04/30/2017 10:14 AM by Conard Novak, MD    [x]  Needs PPX at 36 weeks * lesion noted at 36-week visit, very small, upper right intralabial fold.  Cultured and negative for HSV.  Still need to make  sure resolved before attempting vaginal delivery.      Obesity in pregnancy, antepartum 05/09/2015 by [name], MD No   Overview Signed 02/08/2017  4:06 PM by Farrel Conners, [name]    BMI>40. Need monthly growth scans. Start weekly antepartum testing at 36 weeks. Early glucola=82      History of depression 05/09/2015 by [name], MD No   Breech malpresentation successfully converted to cephalic presentation 04/15/2018 by Nadara Mustard, MD 05/02/2018 by Vena Austria, MD   BMI 40.0-44.9, adult [facility]) 02/27/2017 by Conard Novak, MD 05/02/2018 by Vena Austria, MD     Review of Systems: 10 point review of systems negative unless otherwise noted in HPI  Past Medical History: Past Medical History:  Diagnosis Date  . Allergy   . Depression   . Eczema   . GERD (gastroesophageal reflux disease)   . Intertrigo   . Obesity   . OCD (obsessive compulsive disorder)   . Panic attack     Past Surgical History: Past Surgical  History:  Procedure Laterality Date  . CHOLECYSTECTOMY  01/09/2011   Dr. Excell Seltzerooper    Past Obstetric History: #: 1, Date: 02/20/10, Sex: Female, Weight: 8 lb 15 oz (4.054 kg), GA: 8015w0d, Delivery: Vaginal, Spontaneous, Apgar1: None, Apgar5: None, Living: Living, Birth Comments: aortic stenosis  #: 2, Date: 11/20/14, Sex: Female, Weight: 7 lb 4 oz (3.289 kg), GA: 3886w0d, Delivery: Vaginal, Spontaneous, Apgar1: None, Apgar5: None, Living: Living, Birth Comments: None  #: 3, Date: 05/24/17, Sex: Female, Weight: 8 lb 5 oz (3.77 kg), GA: 5432w1d, Delivery: Vaginal, Spontaneous, Apgar1: 9, Apgar5: 10, Living: Living, Birth Comments: None  #: 4, Date: None, Sex: None, Weight: None, GA: None, Delivery: None, Apgar1: None, Apgar5: None, Living: None, Birth Comments: None   Past Gynecologic History:  Family History: Family History  Problem Relation Age of Onset  . Hypertension Mother   . Diabetes Mother   . Headache Mother   . Hypertension Father   . Lung  cancer Father   . Asthma Father   . Heart disease Son        Aortic Valve Disorder Stenosis-had balloon valvuloplasty, on meds  . Ovarian cancer Maternal Grandmother   . Colon cancer Maternal Grandfather     Social History: Social History   Socioeconomic History  . Marital status: Single    Spouse name: Not on file  . Number of children: 3  . Years of education: highschool  . Highest education level: Not on file  Occupational History    Employer: UNEMPLOYED  Social Needs  . Financial resource strain: Not on file  . Food insecurity:    Worry: Not on file    Inability: Not on file  . Transportation needs:    Medical: Not on file    Non-medical: Not on file  Tobacco Use  . Smoking status: Former Games developermoker  . Smokeless tobacco: Never Used  Substance and Sexual Activity  . Alcohol use: No    Alcohol/week: 0.0 oz  . Drug use: No  . Sexual activity: Not Currently    Partners: Male  Lifestyle  . Physical activity:    Days per week: Not on file    Minutes per session: Not on file  . Stress: Not on file  Relationships  . Social connections:    Talks on phone: Not on file    Gets together: Not on file    Attends religious service: Not on file    Active member of club or organization: Not on file    Attends meetings of clubs or organizations: Not on file    Relationship status: Not on file  . Intimate partner violence:    Fear of current or ex partner: Not on file    Emotionally abused: Not on file    Physically abused: Not on file    Forced sexual activity: Not on file  Other Topics Concern  . Not on file  Social History Narrative  . Not on file    Medications: Prior to Admission medications   Medication Sig Start Date End Date Taking? Authorizing Provider  ferrous sulfate (FERROUSUL) 325 (65 FE) MG tablet Take 1 tablet (325 mg total) by mouth daily with breakfast. 03/04/18   Oswaldo ConroySchmid, Jacelyn Y, [name]  nystatin cream (MYCOSTATIN) Apply 1 application topically 2 (two)  times daily. Patient not taking: Reported on 04/15/2018 04/01/18   Vena AustriaStaebler, Cailin Gebel, MD  Prenatal-DSS-FeCb-FeGl-FA [facility](CITRANATAL BLOOM) 90-1 MG TABS Take 1 tablet by mouth daily. 02/12/18   [name], [name], [name]  ranitidine (  ZANTAC) 150 MG tablet Take 1 tablet (150 mg total) by mouth 2 (two) times daily as needed for heartburn. 02/05/18   Farrel Conners, [name]  valACYclovir (VALTREX) 500 MG tablet TAKE 2 TABLETs(1000 MG) BY MOUTH DAILY 04/27/18   Tresea Mall, [name]    Allergies: No Known Allergies  Physical Exam: Blood pressure 119/63, pulse 95, temperature 98.2 F (36.8 C), temperature source Oral, resp. rate 18, height 5\' 6"  (1.676 m), weight 262 lb (118.8 kg), last menstrual period 07/20/2017, unknown if currently breastfeeding.  FHT: 140, moderate, +accels, no decels Toco: none  General: NAD HEENT: normocephalic, anicteric Pulmonary: No increased work of breathing Cardiovascular: RRR, distal pulses 2+ Abdomen: Gravid, non-tender Leopolds: vtx Genitourinary: 1/50/-3, no visible lesion on speculum exam Extremities: no edema, erythema, or tenderness Neurologic: Grossly intact Psychiatric: mood appropriate, affect full  Labs: No results found for this or any previous visit (from the past 24 hour(s)).  Assessment: 27 y.o. [id number] [redacted]w[redacted]d by 04/29/2018 presenting for IOL  Plan: 1) Labor - Cytotec induction with foley bulb placement   2) Fetus -cat I   3) PNL - Blood type --/--/O POS (06/06 1321) / Anti-bodyscreen NEG (06/06 1321) / Rubella 4.16 (11/13 1057) /  RPR Non Reactive (06/06 1321) / HBsAg Negative (11/13 1057) / HIV Non Reactive (03/29 1008) / GBS Negative (05/23 1644)  4) Immunization History -  Immunization History  Administered Date(s) Administered  . HPV Quadrivalent 02/10/2013, 04/13/2013, 10/18/2013  . Influenza-Unspecified 10/18/2013  . Tdap 02/22/2010, 03/31/2017, 02/19/2018    5) Disposition - pending delivery  Vena Austria, MD, Mer[facility] Frederick [facility],  [facility] 05/02/2018, 8:22 AM

## 2018-05-02 NOTE — Anesthesia Preprocedure Evaluation (Signed)
Anesthesia Evaluation  Patient identified by MRN, date of birth, ID band Patient awake    History of Anesthesia Complications Negative for: history of anesthetic complications  Airway Mallampati: II  TM Distance: >3 FB Neck ROM: Full    Dental   Pulmonary neg pulmonary ROS, former smoker,           Cardiovascular Exercise Tolerance: Good   Murmur (recently told)   Neuro/Psych PSYCHIATRIC DISORDERS (OCD) Anxiety Depression negative neurological ROS     GI/Hepatic Neg liver ROS, GERD  ,  Endo/Other  negative endocrine ROS  Renal/GU negative Renal ROS  negative genitourinary   Musculoskeletal negative musculoskeletal ROS (+)   Abdominal   Peds negative pediatric ROS (+)  Hematology negative hematology ROS (+)   Anesthesia Other Findings Past Medical History: No date: Allergy No date: Depression No date: Eczema No date: GERD (gastroesophageal reflux disease) No date: Intertrigo No date: Obesity No date: OCD (obsessive compulsive disorder) No date: Panic attack   Reproductive/Obstetrics (+) Pregnancy                             Anesthesia Physical  Anesthesia Plan  ASA: II  Anesthesia Plan: Epidural   Post-op Pain Management:    Induction:   PONV Risk Score and Plan:   Airway Management Planned:   Additional Equipment:   Intra-op Plan:   Post-operative Plan:   Informed Consent: I have reviewed the patients History and Physical, chart, labs and discussed the procedure including the risks, benefits and alternatives for the proposed anesthesia with the patient or authorized representative who has indicated his/her understanding and acceptance.     Plan Discussed with: Anesthesiologist  Anesthesia Plan Comments:         Anesthesia Quick Evaluation

## 2018-05-02 NOTE — Progress Notes (Addendum)
US device appears to be recording inaccurately 1419-1420. Audible fetal HR faster than recorded rate. US monitor replaced with new monitor.

## 2018-05-02 NOTE — Progress Notes (Signed)
  Dr. Karlton LemonKarenz at bedside per nursing/patient request to reevaluate one-sided epidural (pt complaints of right-sided ctx.) Epidural repositioned and partial bolus given (half held d/t pt BP low). Anesthesia administered 10mg  ephedrine IV.

## 2018-05-02 NOTE — Progress Notes (Signed)
US continuing to read innacurately at times (see 1430). Avaolon FM50 and Vear Clockhillips monitors turned off and restarted.

## 2018-05-02 NOTE — Discharge Summary (Addendum)
Obstetric Discharge Summary Reason for Admission: induction of labor Prenatal Procedures: none Intrapartum Procedures: spontaneous vaginal delivery Postpartum Procedures: none Complications-Operative and Postpartum: none Hemoglobin  Date Value Ref Range Status  05/02/2018 10.3 (L) 12.0 - 16.0 g/dL Final  84/13/244003/29/2019 8.9 (L) 11.1 - 15.9 g/dL Final   HCT  Date Value Ref Range Status  05/02/2018 30.6 (L) 35.0 - 47.0 % Final   Hematocrit  Date Value Ref Range Status  02/05/2018 27.8 (L) 34.0 - 46.6 % Final    Physical Exam:  General: alert, cooperative and appears stated age 27Lochia: appropriate Uterine Fundus: firm Incision: NA DVT Evaluation: No evidence of DVT seen on physical exam.  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 Call to schedule appointment.   Newborn Data: Live born female  Birth Weight:  3970 g APGAR: 8, 9  Newborn Delivery   Birth date/time:  05/02/2018 21:14:00 Delivery type:  Vaginal, Spontaneous   Newborn with cleft palate, awaiting pediatric discharge orders pending adequate PO intake.    Lisa MallJane Avett House, [name] 05/04/2018, 11:18 AM

## 2018-05-02 NOTE — Progress Notes (Signed)
US tracing intermittently while pt up to restroom.

## 2018-05-03 LAB — CBC
HCT: 29.2 % — ABNORMAL LOW (ref 35.0–47.0)
Hemoglobin: 9.8 g/dL — ABNORMAL LOW (ref 12.0–16.0)
MCH: 24.6 pg — ABNORMAL LOW (ref 26.0–34.0)
MCHC: 33.4 g/dL (ref 32.0–36.0)
MCV: 73.5 fL — AB (ref 80.0–100.0)
PLATELETS: 150 10*3/uL (ref 150–440)
RBC: 3.97 MIL/uL (ref 3.80–5.20)
RDW: 20.3 % — AB (ref 11.5–14.5)
WBC: 12.2 10*3/uL — ABNORMAL HIGH (ref 3.6–11.0)

## 2018-05-03 MED ORDER — SENNOSIDES-DOCUSATE SODIUM 8.6-50 MG PO TABS
2.0000 | ORAL_TABLET | ORAL | Status: DC
  Administered 2018-05-03 – 2018-05-04 (×2): 2 via ORAL
  Filled 2018-05-03 (×2): qty 2

## 2018-05-03 MED ORDER — TETANUS-DIPHTH-ACELL PERTUSSIS 5-2.5-18.5 LF-MCG/0.5 IM SUSP: 0.5000 mL | Freq: Once | INTRAMUSCULAR | Status: DC

## 2018-05-03 MED ORDER — OXYTOCIN 40 UNITS IN LACTATED RINGERS INFUSION - SIMPLE MED
INTRAVENOUS | Status: AC
  Filled 2018-05-03: qty 1000

## 2018-05-03 MED ORDER — SIMETHICONE 80 MG PO CHEW: 80.0000 mg | CHEWABLE_TABLET | ORAL | Status: DC | PRN

## 2018-05-03 MED ORDER — DIBUCAINE 1 % RE OINT
1.0000 "application " | TOPICAL_OINTMENT | RECTAL | Status: DC | PRN
  Administered 2018-05-03: 1 via RECTAL
  Filled 2018-05-03: qty 28

## 2018-05-03 MED ORDER — COCONUT OIL OIL
1.0000 "application " | TOPICAL_OIL | Status: DC | PRN
  Filled 2018-05-03: qty 120

## 2018-05-03 MED ORDER — FERROUS SULFATE 325 (65 FE) MG PO TABS
325.0000 mg | ORAL_TABLET | Freq: Two times a day (BID) | ORAL | Status: DC
  Administered 2018-05-03 – 2018-05-04 (×3): 325 mg via ORAL
  Filled 2018-05-03 (×3): qty 1

## 2018-05-03 MED ORDER — BENZOCAINE-MENTHOL 20-0.5 % EX AERO: 1.0000 "application " | INHALATION_SPRAY | CUTANEOUS | Status: DC | PRN

## 2018-05-03 MED ORDER — PRENATAL MULTIVITAMIN CH
1.0000 | ORAL_TABLET | Freq: Every day | ORAL | Status: DC
  Administered 2018-05-03 – 2018-05-04 (×2): 1 via ORAL
  Filled 2018-05-03 (×2): qty 1

## 2018-05-03 MED ORDER — VARICELLA VIRUS VACCINE LIVE 1350 PFU/0.5ML IJ SUSR: 0.5000 mL | INTRAMUSCULAR | Status: DC | PRN

## 2018-05-03 MED ORDER — WITCH HAZEL-GLYCERIN EX PADS
1.0000 "application " | MEDICATED_PAD | CUTANEOUS | Status: DC | PRN
  Administered 2018-05-03: 1 via TOPICAL
  Filled 2018-05-03: qty 100

## 2018-05-03 NOTE — Progress Notes (Signed)
PPD#0 SVD Subjective:  Sitting up in bed, eating breakfast. Pain control is adequate. Voiding without difficulty. Tolerating a regular diet. Ambulating well.  Objective:   Blood pressure 107/64, pulse (!) 56, temperature 98.3 F (36.8 C), temperature source Oral, resp. rate 18, height 5\' 6"  (1.676 m), weight 262 lb (118.8 kg), last menstrual period 07/20/2017, SpO2 99 %, currently breastfeeding.  General: NAD Pulmonary: no increased work of breathing Abdomen: non-distended, non-tender Uterus:  fundus firm; lochia appropriate Laceration: N/A Extremities: trace edema, no erythema, no tenderness, no signs of DVT  Results for orders placed or performed during the hospital encounter of 05/02/18 (from the past 72 hour(s))  CBC     Status: Abnormal   Collection Time: 05/02/18  8:58 AM  Result Value Ref Range   WBC 9.9 3.6 - 11.0 K/uL   RBC 4.20 3.80 - 5.20 MIL/uL   Hemoglobin 10.3 (L) 12.0 - 16.0 g/dL   HCT 16.130.6 (L) 09.635.0 - 04.547.0 %   MCV 72.8 (L) 80.0 - 100.0 fL   MCH 24.6 (L) 26.0 - 34.0 pg   MCHC 33.8 32.0 - 36.0 g/dL   RDW 40.920.3 (H) 81.111.5 - 91.414.5 %   Platelets 186 150 - 440 K/uL    Comment: Performed at [facility], [address] [place], [place] [address]  Type and screen [facility]     Status: None   Collection Time: 05/02/18  8:58 AM  Result Value Ref Range   ABO/RH(D) O POS    Antibody Screen NEG    Sample Expiration      05/05/2018 Performed at [facility], [address] [place], [place] [address]   CBC     Status: Abnormal   Collection Time: 05/03/18  6:15 AM  Result Value Ref Range   WBC 12.2 (H) 3.6 - 11.0 K/uL   RBC 3.97 3.80 - 5.20 MIL/uL   Hemoglobin 9.8 (L) 12.0 - 16.0 g/dL   HCT 86.529.2 (L) 78.435.0 - 69.647.0 %   MCV 73.5 (L) 80.0 - 100.0 fL   MCH 24.6 (L) 26.0 - 34.0 pg   MCHC 33.4 32.0 - 36.0 g/dL   RDW 29.520.3 (H) 28.411.5 - 13.214.5 %   Platelets 150 150 - 440 K/uL    Comment: Performed at [facility], [address] [place], [place] [address]    Assessment:   27 y.o. [id number] postpartum day # 0 recovering well.  Plan:    1) Acute blood loss anemia - hemodynamically stable and asymptomatic - PO ferrous sulfate  2) Blood Type --/--/O POS (06/23 [id number])   3) Rubella 4.16 (11/13 1057) / Varicella non-immune, Varivax ordered /TDAP given 02/19/2018.  4) Breast and formula feeding  5) Contraception - vasectomy  6) Disposition: continue postpartum care.  Marcelyn BruinsJacelyn Schmid, [name] 05/03/2018  8:56 AM

## 2018-05-03 NOTE — Anesthesia Postprocedure Evaluation (Signed)
Anesthesia Post Note  Patient: Lisa House  Procedure(s) Performed: AN AD HOC LABOR EPIDURAL  Patient location during evaluation: Mother Baby Anesthesia Type: Epidural Level of consciousness: awake Pain management: pain level controlled Vital Signs Assessment: post-procedure vital signs reviewed and stable Respiratory status: spontaneous breathing Cardiovascular status: blood pressure returned to baseline Postop Assessment: no headache and no backache Anesthetic complications: no     Last Vitals:  Vitals:   05/03/18 0128 05/03/18 0334  BP: (!) 108/58 (!) 101/54  Pulse: 62 (!) 57  Resp: 20 16  Temp: 36.7 C 36.7 C  SpO2: 100% 100%    Last Pain:  Vitals:   05/03/18 0334  TempSrc: Oral  PainSc:                  Lisa House

## 2018-05-03 NOTE — Progress Notes (Signed)
Pt requesting to be taken out of the directory. Pt requesting password to be [employer]

## 2018-05-04 LAB — RPR: RPR Ser Ql: NONREACTIVE

## 2018-05-04 NOTE — Progress Notes (Addendum)
Discharge instructions given. Patient verbalizes understanding of teaching. Infant to remain a patient until tomorrow. Rooming in instructions given to mom. Patient declined varicella vaccine. Mom verbalizes understanding of teaching.

## 2018-05-20 ENCOUNTER — Telehealth: Payer: Self-pay | Admitting: Obstetrics and Gynecology

## 2018-05-20 NOTE — Telephone Encounter (Signed)
Patient is schedule 06/15/18 with AMS for Mirena insertion

## 2018-06-15 ENCOUNTER — Ambulatory Visit (INDEPENDENT_AMBULATORY_CARE_PROVIDER_SITE_OTHER): Payer: Medicaid Other | Admitting: Obstetrics and Gynecology

## 2018-06-15 ENCOUNTER — Other Ambulatory Visit (HOSPITAL_COMMUNITY)
Admission: RE | Admit: 2018-06-15 | Discharge: 2018-06-15 | Disposition: A | Discharge status: Home / Self Care | Payer: Medicaid Other | Source: Ambulatory Visit | Attending: Obstetrics and Gynecology | Admitting: Obstetrics and Gynecology

## 2018-06-15 ENCOUNTER — Encounter: Payer: Self-pay | Admitting: Obstetrics and Gynecology

## 2018-06-15 DIAGNOSIS — Z124 Encounter for screening for malignant neoplasm of cervix: Secondary | ICD-10-CM

## 2018-06-15 DIAGNOSIS — Z30430 Encounter for insertion of intrauterine contraceptive device: Secondary | ICD-10-CM

## 2018-06-15 NOTE — Progress Notes (Addendum)
Postpartum Visit  Chief Complaint:  Chief Complaint  Patient presents with  . Postpartum Care  . Contraception    Mirena Insert    History of Present Illness: Patient is a 27 y.o. [id number] presents for postpartum visit.   Review the Delivery Report for details.  Delivery Note On 05/02/2018 t 9:14 PM a viable female was delivered via Vaginal, Spontaneous   APGAR: 8, 9; weight 8 lb 12 oz (3970 g).    Type of delivery: Vaginal delivery - Vacuum or forceps assisted  no Episiotomy No.  Laceration: no  Pregnancy or labor problems:  no Any problems since the delivery:  no  Maternal Details:  Breast or formula feeding: plans to breastfeed Intercourse: No  Contraception after delivery: Yes  Any bowel or bladder issues: No  Post partum depression/anxiety noted:  no Edinburgh Post-Partum Depression Score:7  Review of Systems: Review of Systems  Constitutional: Negative for chills and fever.  HENT: Negative for congestion.   Respiratory: Negative for cough and shortness of breath.   Cardiovascular: Negative for chest pain and palpitations.  Gastrointestinal: Negative for abdominal pain, constipation, diarrhea, heartburn, nausea and vomiting.  Genitourinary: Negative for dysuria, frequency and urgency.  Skin: Negative for itching and rash.  Neurological: Negative for dizziness and headaches.  Endo/Heme/Allergies: Negative for polydipsia.  Psychiatric/Behavioral: Negative for depression.    The following portions of the patient's history were reviewed and updated as appropriate: allergies, current medications, past family history, past medical history, past social history, past surgical history and problem list.  Past Medical History:  Past Medical History:  Diagnosis Date  . Allergy   . Depression   . Eczema   . GERD (gastroesophageal reflux disease)   . Intertrigo   . Obesity   . OCD (obsessive compulsive disorder)   . Panic attack     Past Surgical History:  Past  Surgical History:  Procedure Laterality Date  . CHOLECYSTECTOMY  01/09/2011   Dr. Excell Seltzer    Family History:  Family History  Problem Relation Age of Onset  . Hypertension Mother   . Diabetes Mother   . Headache Mother   . Hypertension Father   . Lung cancer Father   . Asthma Father   . Heart disease Son        Aortic Valve Disorder Stenosis-had balloon valvuloplasty, on meds  . Ovarian cancer Maternal Grandmother   . Colon cancer Maternal Grandfather     Social History:  Social History   Socioeconomic History  . Marital status: Single    Spouse name: Not on file  . Number of children: 3  . Years of education: highschool  . Highest education level: Not on file  Occupational History    Employer: UNEMPLOYED  Social Needs  . Financial resource strain: Not on file  . Food insecurity:    Worry: Not on file    Inability: Not on file  . Transportation needs:    Medical: Not on file    Non-medical: Not on file  Tobacco Use  . Smoking status: Former Games developer  . Smokeless tobacco: Never Used  Substance and Sexual Activity  . Alcohol use: No    Alcohol/week: 0.0 oz  . Drug use: No  . Sexual activity: Not Currently    Partners: Male  Lifestyle  . Physical activity:    Days per week: Not on file    Minutes per session: Not on file  . Stress: Not on file  Relationships  .  Social connections:    Talks on phone: Not on file    Gets together: Not on file    Attends religious service: Not on file    Active member of club or organization: Not on file    Attends meetings of clubs or organizations: Not on file    Relationship status: Not on file  . Intimate partner violence:    Fear of current or ex partner: Not on file    Emotionally abused: Not on file    Physically abused: Not on file    Forced sexual activity: Not on file  Other Topics Concern  . Not on file  Social History Narrative  . Not on file    Allergies:  No Known Allergies  Medications: Prior to  Admission medications   Medication Sig Start Date End Date Taking? Authorizing Provider  Prenatal-DSS-FeCb-FeGl-FA (CITRANATAL BLOOM) 90-1 MG TABS Take 1 tablet by mouth daily. 02/12/18   [name], [name], [name]    Physical Exam Blood pressure 110/62, pulse 69, height 5\' 5"  (1.651 m), weight 245 lb (111.1 kg), last menstrual period 06/13/2018, not currently breastfeeding.    General: NAD HEENT: normocephalic, anicteric Pulmonary: No increased work of breathing Abdomen: NABS, soft, non-tender, non-distended.  Umbilicus without lesions.  No hepatomegaly, splenomegaly or masses palpable. No evidence of hernia. Genitourinary:  External: Normal external female genitalia.  Normal urethral meatus, normal  Bartholin's and Skene's glands.    Vagina: Normal vaginal mucosa, no evidence of prolapse.    Cervix: Grossly normal in appearance, no bleeding  Uterus: Non-enlarged, mobile, normal contour.  No CMT  Adnexa: ovaries non-enlarged, no adnexal masses  Rectal: deferred Extremities: no edema, erythema, or tenderness Neurologic: Grossly intact Psychiatric: mood appropriate, affect full  Edinburgh Postnatal Depression Scale - 06/15/18 1008      Edinburgh Postnatal Depression Scale:  In the Past 7 Days   I have been able to laugh and see the funny side of things.  0    I have looked forward with enjoyment to things.  0    I have blamed myself unnecessarily when things went wrong.  2    I have been anxious or worried for no good reason.  2    I have felt scared or panicky for no good reason.  1    Things have been getting on top of me.  0    I have been so unhappy that I have had difficulty sleeping.  0    I have felt sad or miserable.  1    I have been so unhappy that I have been crying.  1    The thought of harming myself has occurred to me.  0    Edinburgh Postnatal Depression Scale Total  7        GYNECOLOGY OFFICE PROCEDURE NOTE  Lisa House is a 27 y.o. [phone] here for a Mirena  IUD insertion. No GYN concerns.  Last pap smear was obtained today.  The patient is currently using postpartum breast feeding for contraception and her LMP is Patient's last menstrual period was 06/13/2018 (exact date)..  The indication for her IUD is contraception/cycle control.  IUD Insertion Procedure Note Patient identified, informed consent performed, consent signed.   Discussed risks of irregular bleeding, cramping, infection, malpositioning, expulsion or uterine perforation of the IUD (1:1000 placements)  which may require further procedure such as laparoscopy.  IUD while effective at preventing pregnancy do not prevent transmission of sexually transmitted diseases and use of  barrier methods for this purpose was discussed. Time out was performed.  Urine pregnancy test negative.  Speculum placed in the vagina.  Cervix visualized.  Cleaned with Betadine x 2.  Grasped anteriorly with a single tooth tenaculum.  Uterus sounded to 8 cm. IUD placed per manufacturer's recommendations.  Strings trimmed to 3 cm. Tenaculum was removed, good hemostasis noted.  Patient tolerated procedure well.   Patient was given post-procedure instructions.  She was advised to have backup contraception for one week.  Patient was also asked to check IUD strings periodically and follow up in 6 weeks for IUD check.  Assessment: 27 y.o. [id number] presenting for 6 week postpartum visit  Plan: Problem List Items Addressed This Visit    None    Visit Diagnoses    6 weeks postpartum follow-up    -  Primary   Relevant Orders   Cytology - PAP   Encounter for IUD insertion       Screening for malignant neoplasm of cervix       Relevant Orders   Cytology - PAP       1) Contraception - Education given regarding options for contraception, as well as compatibility with breast feeding if applicable.  Patient plans on IUD for contraception.  2)  Pap - ASCCP guidelines and rational discussed.  ASCCP guidelines and rational  discussed.  Patient opts for every 3 years screening interval - obtained today  3) Patient underwent screening for postpartum depression with no signs of depression  4) Return in about 6 weeks (around 07/27/2018) for 6 week IUD string check.   Vena Austria, MD, [facility], [facility] 06/15/2018, 10:13 AM

## 2018-06-15 NOTE — Addendum Note (Signed)
Addended by: Lorrene ReidSTAEBLER, Bengie Kaucher M on: 06/15/2018 10:13 AM   Modules accepted: Orders

## 2018-06-15 NOTE — Telephone Encounter (Signed)
Mirena rcvd/charged 06/15/18 

## 2018-06-17 LAB — CYTOLOGY - PAP: DIAGNOSIS: NEGATIVE

## 2018-07-02 ENCOUNTER — Encounter: Payer: Self-pay | Admitting: Family Medicine

## 2018-07-02 ENCOUNTER — Ambulatory Visit: Payer: 59 | Admitting: Family Medicine

## 2018-07-02 ENCOUNTER — Other Ambulatory Visit (HOSPITAL_COMMUNITY)
Admission: RE | Admit: 2018-07-02 | Discharge: 2018-07-02 | Disposition: A | Discharge status: Home / Self Care | Payer: Medicaid Other | Source: Ambulatory Visit | Attending: Family Medicine | Admitting: Family Medicine

## 2018-07-02 VITALS — BP 112/70 | HR 75 | Temp 98.2°F | Resp 16 | Ht 66.0 in | Wt 252.2 lb

## 2018-07-02 DIAGNOSIS — N760 Acute vaginitis: Secondary | ICD-10-CM | POA: Diagnosis present

## 2018-07-02 DIAGNOSIS — B3731 Acute candidiasis of vulva and vagina: Secondary | ICD-10-CM

## 2018-07-02 DIAGNOSIS — L409 Psoriasis, unspecified: Secondary | ICD-10-CM | POA: Diagnosis not present

## 2018-07-02 DIAGNOSIS — E282 Polycystic ovarian syndrome: Secondary | ICD-10-CM

## 2018-07-02 DIAGNOSIS — B373 Candidiasis of vulva and vagina: Secondary | ICD-10-CM | POA: Diagnosis not present

## 2018-07-02 DIAGNOSIS — L308 Other specified dermatitis: Secondary | ICD-10-CM

## 2018-07-02 MED ORDER — LEVONORGESTREL 20 MCG/24HR IU IUD: 1.0000 | INTRAUTERINE_SYSTEM | Freq: Once | INTRAUTERINE | 0 refills | Status: DC

## 2018-07-02 MED ORDER — PIMECROLIMUS 1 % EX CREA: TOPICAL_CREAM | Freq: Two times a day (BID) | CUTANEOUS | 0 refills | Status: DC

## 2018-07-02 MED ORDER — TRIAMCINOLONE ACETONIDE 0.1 % EX CREA: 1.0000 "application " | TOPICAL_CREAM | Freq: Two times a day (BID) | CUTANEOUS | 0 refills | Status: DC

## 2018-07-02 MED ORDER — FLUCONAZOLE 150 MG PO TABS: 150.0000 mg | ORAL_TABLET | ORAL | 0 refills | Status: DC

## 2018-07-02 MED ORDER — METFORMIN HCL ER 750 MG PO TB24
750.0000 mg | ORAL_TABLET | Freq: Every day | ORAL | 2 refills | Status: DC
Start: 2018-07-02 — End: 2019-01-11

## 2018-07-02 NOTE — Progress Notes (Signed)
Name: Lisa House   MRN: [id number]    DOB: Nov 16, 1990   Date:07/02/2018       Progress Note  Subjective  Chief Complaint  Chief Complaint  Patient presents with  . Vaginitis    patient presents with thick white discharge with itching for the past week. she recently had the Mirena placed about 2 weeks. OTC [employer] brand suppositories    HPI  Vaginal discharge: patient has noticed vaginal itching and white discharge for the past week. She states it feels like yeast infection. No recent antibiotic use. No abdominal cramping. She had an IUD placed by gyn a couple of weeks ago. No change in sexual partner and had her last child about 8 weeks ago  Obesity: she gained 15 lbs during last pregnancy. She states she would like to resume Metformin because it helped her feel better and caused weight loss. She has a history of POC  Rash: she was diagnosed with psoriasis at urgent care many years ago, states still has a rash, dry hands and also a spot on her ankle, states chronic and itchy. She wold like a refill of steroid cream   Patient Active Problem List   Diagnosis Date Noted  . Family history of congenital aortic stenosis 09/23/2017  . Herpes genitalis 02/08/2017  . IBS (irritable bowel syndrome) 05/09/2015  . Obesity in pregnancy, antepartum 05/09/2015  . OCD (obsessive compulsive disorder) 05/09/2015  . Bilateral polycystic ovarian syndrome 05/09/2015  . Psoriasis 05/09/2015  . History of depression 05/09/2015  . Allergic rhinitis 05/02/2010    Past Surgical History:  Procedure Laterality Date  . CHOLECYSTECTOMY  01/09/2011   Dr. Excell Seltzerooper    Family History  Problem Relation Age of Onset  . Hypertension Mother   . Diabetes Mother   . Headache Mother   . Hypertension Father   . Lung cancer Father   . Asthma Father   . Heart disease Son        Aortic Valve Disorder Stenosis-had balloon valvuloplasty, on meds  . Ovarian cancer Maternal Grandmother   . Colon cancer Maternal  Grandfather     Social History   Socioeconomic History  . Marital status: Single    Spouse name: Not on file  . Number of children: 3  . Years of education: high school  . Highest education level: High school graduate  Occupational History    Employer: UNEMPLOYED  Social Needs  . Financial resource strain: Somewhat hard  . Food insecurity:    Worry: Never true    Inability: Never true  . Transportation needs:    Medical: No    Non-medical: No  Tobacco Use  . Smoking status: Former Games developermoker  . Smokeless tobacco: Never Used  Substance and Sexual Activity  . Alcohol use: No    Alcohol/week: 0.0 standard drinks  . Drug use: No  . Sexual activity: Not Currently    Partners: Male    Birth control/protection: IUD  Lifestyle  . Physical activity:    Days per week: 7 days    Minutes per session: 30 min  . Stress: Not at all  Relationships  . Social connections:    Talks on phone: More than three times a week    Gets together: More than three times a week    Attends religious service: More than 4 times per year    Active member of club or organization: No    Attends meetings of clubs or organizations: Never    Relationship  status: Never married  . Intimate partner violence:    Fear of current or ex partner: No    Emotionally abused: No    Physically abused: No    Forced sexual activity: No  Other Topics Concern  . Not on file  Social History Narrative  . Not on file    No current outpatient medications on file.  No Known Allergies   ROS  Constitutional: Negative for fever, positive for weight change.  Respiratory: Negative for cough and shortness of breath.   Cardiovascular: Negative for chest pain or palpitations.  Gastrointestinal: Negative for abdominal pain, no bowel changes.  Musculoskeletal: Negative for gait problem or joint swelling.  Skin: positive for rash.  Neurological: Negative for dizziness or headache.  No other specific complaints in a complete  review of systems (except as listed in HPI above).  Objective  Vitals:   07/02/18 1509  BP: 112/70  Pulse: 75  Resp: 16  Temp: 98.2 F (36.8 C)  TempSrc: Oral  SpO2: 99%  Weight: 252 lb 3.2 oz (114.4 kg)  Height: 5\' 6"  (1.676 m)    Body mass index is 40.71 kg/m.  Physical Exam  Constitutional: Patient appears well-developed and well-nourished. Obese  No distress.  HEENT: head atraumatic, normocephalic, pupils equal and reactive to light,  neck supple, throat within normal limits Cardiovascular: Normal rate, regular rhythm and normal heart sounds.  No murmur heard. No BLE edema. Pulmonary/Chest: Effort normal and breath sounds normal. No respiratory distress. Abdominal: Soft.  There is no tenderness. Skin: dry hands no erythema, also large round patch on right ankle, states diagnosed with psoriasis at urgent care, explained it looks more like eczema Psychiatric: Patient has a normal mood and affect. behavior is normal. Judgment and thought content normal.  Recent Results (from the past 2160 hour(s))  CBC     Status: Abnormal   Collection Time: 04/15/18  1:21 PM  Result Value Ref Range   WBC 8.7 3.6 - 11.0 K/uL   RBC 4.04 3.80 - 5.20 MIL/uL   Hemoglobin 9.9 (L) 12.0 - 16.0 g/dL   HCT 16.1 (L) 09.6 - 04.5 %   MCV 72.3 (L) 80.0 - 100.0 fL   MCH 24.4 (L) 26.0 - 34.0 pg   MCHC 33.7 32.0 - 36.0 g/dL   RDW 40.9 (H) 81.1 - 91.4 %   Platelets 163 150 - 440 K/uL    Comment: Performed at [facility], [address] [place], [place] [address]  Type and screen [facility]     Status: None   Collection Time: 04/15/18  1:21 PM  Result Value Ref Range   ABO/RH(D) O POS    Antibody Screen NEG    Sample Expiration      04/18/2018 Performed at [facility], [address] [place], [place] [address]   RPR     Status: None   Collection Time: 04/15/18  1:21 PM  Result Value Ref Range   RPR Ser Ql Non Reactive Non Reactive    Comment:  (NOTE) Performed At: [facility] [address] [place], [place] [id number] Jolene Schimke MD EX:[id number] Performed at [facility], [address] [place], [place] [address]   CBC     Status: Abnormal   Collection Time: 05/02/18  8:58 AM  Result Value Ref Range   WBC 9.9 3.6 - 11.0 K/uL   RBC 4.20 3.80 - 5.20 MIL/uL   Hemoglobin 10.3 (L) 12.0 - 16.0 g/dL  HCT 30.6 (L) 35.0 - 47.0 %   MCV 72.8 (L) 80.0 - 100.0 fL   MCH 24.6 (L) 26.0 - 34.0 pg   MCHC 33.8 32.0 - 36.0 g/dL   RDW 16.1 (H) 09.6 - 04.5 %   Platelets 186 150 - 440 K/uL    Comment: Performed at [facility], [address] [place], [place] [address]  Type and screen [facility]     Status: None   Collection Time: 05/02/18  8:58 AM  Result Value Ref Range   ABO/RH(D) O POS    Antibody Screen NEG    Sample Expiration      05/05/2018 Performed at [facility], [address] [place], [place] [address]   RPR     Status: None   Collection Time: 05/02/18  8:58 AM  Result Value Ref Range   RPR Ser Ql Non Reactive Non Reactive    Comment: (NOTE) Performed At: [facility] [address] [place], [place] [id number] Jolene Schimke MD [phone] Performed at [facility], [address] [place], [place] [address]   CBC     Status: Abnormal   Collection Time: 05/03/18  6:15 AM  Result Value Ref Range   WBC 12.2 (H) 3.6 - 11.0 K/uL   RBC 3.97 3.80 - 5.20 MIL/uL   Hemoglobin 9.8 (L) 12.0 - 16.0 g/dL   HCT 24.4 (L) 01.0 - 27.2 %   MCV 73.5 (L) 80.0 - 100.0 fL   MCH 24.6 (L) 26.0 - 34.0 pg   MCHC 33.4 32.0 - 36.0 g/dL   RDW 53.6 (H) 64.4 - 03.4 %   Platelets 150 150 - 440 K/uL    Comment: Performed at [facility], [address] [place], [place] [address]  Cytology - PAP     Status: None   Collection Time: 06/15/18 12:00 AM  Result Value Ref Range   Adequacy      Satisfactory for evaluation   endocervical/transformation zone component PRESENT.   Diagnosis      NEGATIVE FOR INTRAEPITHELIAL LESIONS OR MALIGNANCY.   Material Submitted CervicoVaginal Pap [ThinPrep Imaged]       PHQ2/9: Depression screen [facility] 2/9 07/02/2018 11/12/2016 05/09/2015  Decreased Interest 0 0 0  Down, Depressed, Hopeless 0 0 0  PHQ - 2 Score 0 0 0  Altered sleeping 0 - -  Tired, decreased energy 3 - -  Change in appetite 0 - -  Feeling bad or failure about yourself  0 - -  Trouble concentrating 0 - -  Moving slowly or fidgety/restless 0 - -  Suicidal thoughts 0 - -  PHQ-9 Score 3 - -  Difficult doing work/chores Not difficult at all - -     Fall Risk: Fall Risk  07/02/2018 05/09/2015  Falls in the past year? Yes Yes  Number falls in past yr: 1 -  Injury with Fall? No No    Functional Status Survey: Is the patient deaf or have difficulty hearing?: No Does the patient have difficulty seeing, even when wearing glasses/contacts?: No Does the patient have difficulty concentrating, remembering, or making decisions?: No Does the patient have difficulty walking or climbing stairs?: No Does the patient have difficulty dressing or bathing?: No Does the patient have difficulty doing errands alone such as visiting a doctor's office or shopping?: No   Assessment & Plan  1. Psoriasis  - triamcinolone cream (KENALOG) 0.1 %; Apply 1 application topically 2 (two) times daily.  Dispense:  30 g; Refill: 0  2. Bilateral polycystic ovarian syndrome  - metFORMIN (GLUCOPHAGE-XR) 750 MG 24 hr tablet; Take 1 tablet (750 mg total) by mouth daily with breakfast.  Dispense: 30 tablet; Refill: 2  3. Morbid obesity (HCC)  Discussed with the patient the risk posed by an increased BMI. Discussed importance of portion control, calorie counting and at least 150 minutes of physical activity weekly. Avoid sweet beverages and drink more water. Eat at least 6 servings of fruit and vegetables daily   4. Yeast vaginitis  -  fluconazole (DIFLUCAN) 150 MG tablet; Take 1 tablet (150 mg total) by mouth every other day.  Dispense: 3 tablet; Refill: 0  5. Acute vaginitis  - Cervicovaginal ancillary only  6. Other eczema  - pimecrolimus (ELIDEL) 1 % cream; Apply topically 2 (two) times daily.  Dispense: 100 g; Refill: 0

## 2018-07-05 ENCOUNTER — Telehealth: Payer: Self-pay | Admitting: Obstetrics and Gynecology

## 2018-07-05 ENCOUNTER — Ambulatory Visit (INDEPENDENT_AMBULATORY_CARE_PROVIDER_SITE_OTHER): Payer: Medicaid Other

## 2018-07-05 ENCOUNTER — Encounter: Payer: Self-pay | Admitting: Obstetrics and Gynecology

## 2018-07-05 ENCOUNTER — Ambulatory Visit (INDEPENDENT_AMBULATORY_CARE_PROVIDER_SITE_OTHER): Payer: Medicaid Other | Admitting: Obstetrics and Gynecology

## 2018-07-05 VITALS — BP 110/60 | HR 56 | Ht 66.0 in | Wt 252.0 lb

## 2018-07-05 DIAGNOSIS — T8332XA Displacement of intrauterine contraceptive device, initial encounter: Secondary | ICD-10-CM | POA: Diagnosis not present

## 2018-07-05 DIAGNOSIS — N83202 Unspecified ovarian cyst, left side: Secondary | ICD-10-CM | POA: Diagnosis not present

## 2018-07-05 DIAGNOSIS — Z30431 Encounter for routine checking of intrauterine contraceptive device: Secondary | ICD-10-CM | POA: Diagnosis not present

## 2018-07-05 LAB — CERVICOVAGINAL ANCILLARY ONLY
Bacterial vaginitis: NEGATIVE
Candida vaginitis: POSITIVE — AB
Trichomonas: NEGATIVE

## 2018-07-05 NOTE — Telephone Encounter (Signed)
Pt aware that IUD in correct location. Also aware of LTO cyst, no sx. F/u with Dr. Bonney AidStaebler 9/19 prn.   ULTRASOUND REPORT  Patient Name: Lisa House DOB: 1991-04-26 MRN: [id number]  Location: [facility]  Date of Service: 07/05/2018    Indications:IUD Location  Findings:  The uterus is anteverted and measures 10.33 x 6.24 x 5.14cm. Echo texture is homogenous without evidence of focal masses.  The Endometrium measures 5.37 mm. - IUD is in the appropriate location in the fundus of the uterus  Right Ovary measures 3.21 x 2.05 x 2.41 cm. It is normal in appearance. Left Ovary measures 5.18 x 4.62 x 4.41 cm. It is not normal in appearance. - there is a cyst with hemorrhage measuring 3.79 x 4.22cm Survey of the adnexa demonstrates no adnexal masses. There is no free fluid in the cul de sac.  Impression: 1. Lt hemorrhagic ovarian cyst  2. IUD is in the correct location   Recommendations: 1.Clinical correlation with the patient's History and Physical Exam.   Willette AlmaKristen Priestley, RDMS, RVT

## 2018-07-05 NOTE — Progress Notes (Signed)
   Chief Complaint  Patient presents with  . Follow-up    string check, pt went to PCP to check on yeast inf and is currently being treated on it.     History of Present Illness:  Lisa House is a 27 y.o. that had a Mirena IUD placed approximately 3 weeks ago. Since that time, she denies dyspareunia, pelvic pain, vaginal d/c, heavy bleeding. Has had occas spotting/light bleeding. Cramping resolved a wk after insertion. Can't feel strings however and wants to have it checked. Doing well with it otherwise.   Saw PCP last wk for yeast vag and treated with diflucan with sx relief.   Review of Systems  Constitutional: Negative for fever.  Gastrointestinal: Negative for blood in stool, constipation, diarrhea, nausea and vomiting.  Genitourinary: Negative for dyspareunia, dysuria, flank pain, frequency, hematuria, urgency, vaginal bleeding, vaginal discharge and vaginal pain.  Musculoskeletal: Negative for back pain.  Skin: Negative for rash.    Physical Exam:  BP 110/60   Pulse (!) 56   Ht 5\' 6"  (1.676 m)   Wt 252 lb (114.3 kg)   LMP 06/11/2018 (Exact Date)   Breastfeeding? No   BMI 40.67 kg/m  Body mass index is 40.67 kg/m.  Pelvic exam:  Two IUD strings absent from the cervical os. EGBUS, vaginal vault and cervix: within normal limits   Assessment/Plan:   Intrauterine contraceptive device threads lost, initial encounter - Check u/s. Will f/u with results.  - Plan: US PELVIS TRANSVANGINAL NON-OB (TV ONLY)  Encounter for routine checking of intrauterine contraceptive device (IUD) - Plan: US PELVIS TRANSVANGINAL NON-OB (TV ONLY)   Alicia B. Copland, PA-C 07/05/2018 10:38 AM

## 2018-07-05 NOTE — Patient Instructions (Signed)
I value your feedback and entrusting us with your care. If you get a [facility] patient survey, I would appreciate you taking the time to let us know about your experience today. Thank you! 

## 2018-07-27 ENCOUNTER — Ambulatory Visit: Payer: Medicaid Other | Admitting: Obstetrics and Gynecology

## 2018-08-06 ENCOUNTER — Ambulatory Visit: Payer: Medicaid Other | Admitting: Maternal Newborn

## 2018-08-10 ENCOUNTER — Ambulatory Visit (INDEPENDENT_AMBULATORY_CARE_PROVIDER_SITE_OTHER): Payer: Medicaid Other | Admitting: Maternal Newborn

## 2018-08-10 ENCOUNTER — Encounter: Payer: Self-pay | Admitting: Maternal Newborn

## 2018-08-10 VITALS — BP 110/70 | HR 54 | Ht 66.0 in | Wt 254.0 lb

## 2018-08-10 DIAGNOSIS — R21 Rash and other nonspecific skin eruption: Secondary | ICD-10-CM

## 2018-08-10 DIAGNOSIS — Z30431 Encounter for routine checking of intrauterine contraceptive device: Secondary | ICD-10-CM | POA: Diagnosis not present

## 2018-08-10 NOTE — Progress Notes (Signed)
Obstetrics & Gynecology Office Visit   Chief Complaint:  Chief Complaint  Patient presents with  . Follow-up    IUD CK AND ABDOMINAL PAIN; TWO HARD KNOTS DOWN THERE; HAS BEEN CONSTIPATED    History of Present Illness: Lisa House has had some intermittent mild abdominal pain in the middle of her abdomen. Nothing made it better or worse. She was concerned about the location of her IUD and that it may have been causing the pain. She was also constipated at the time. She was able to have a bowel movement, which improved the pain, and it has since resolved. She was seen in August at which time her IUD strings could not be seen, and an ultrasound showed the IUD in the correct location and a left ovarian cyst. No abnormal bleeding.  She also has two areas of concern on her perineum, one on the right side that appears raised and pink to her on inspection and another on the left side that is flatter and skin colored. She is concerned about a herpes outbreak.  Review of Systems: Review of systems negative unless otherwise noted in HPI.  Past Medical History:  Past Medical History:  Diagnosis Date  . Allergy   . Depression   . Eczema   . GERD (gastroesophageal reflux disease)   . Intertrigo   . Obesity   . OCD (obsessive compulsive disorder)   . Panic attack     Past Surgical History:  Past Surgical History:  Procedure Laterality Date  . CHOLECYSTECTOMY  01/09/2011   Dr. Excell Seltzer    Gynecologic History: Patient's last menstrual period was 07/14/2018 (exact date).  Obstetric History: [id number]  Family History:  Family History  Problem Relation Age of Onset  . Hypertension Mother   . Diabetes Mother   . Headache Mother   . Hypertension Father   . Lung cancer Father   . Asthma Father   . Heart disease Son        Aortic Valve Disorder Stenosis-had balloon valvuloplasty, on meds  . Ovarian cancer Maternal Grandmother   . Colon cancer Maternal Grandfather     Social History:    Social History   Socioeconomic History  . Marital status: Single    Spouse name: Not on file  . Number of children: 3  . Years of education: high school  . Highest education level: High school graduate  Occupational History    Employer: UNEMPLOYED  Social Needs  . Financial resource strain: Somewhat hard  . Food insecurity:    Worry: Never true    Inability: Never true  . Transportation needs:    Medical: No    Non-medical: No  Tobacco Use  . Smoking status: Former Games developer  . Smokeless tobacco: Never Used  Substance and Sexual Activity  . Alcohol use: No    Alcohol/week: 0.0 standard drinks  . Drug use: No  . Sexual activity: Yes    Partners: Male    Birth control/protection: IUD    Comment: Mirena  Lifestyle  . Physical activity:    Days per week: 7 days    Minutes per session: 30 min  . Stress: Not at all  Relationships  . Social connections:    Talks on phone: More than three times a week    Gets together: More than three times a week    Attends religious service: More than 4 times per year    Active member of club or organization: No    Attends  meetings of clubs or organizations: Never    Relationship status: Never married  . Intimate partner violence:    Fear of current or ex partner: No    Emotionally abused: No    Physically abused: No    Forced sexual activity: No  Other Topics Concern  . Not on file  Social History Narrative  . Not on file    Allergies:  No Known Allergies  Medications: Prior to Admission medications   Medication Sig Start Date End Date Taking? Authorizing Provider  metFORMIN (GLUCOPHAGE-XR) 750 MG 24 hr tablet Take 1 tablet (750 mg total) by mouth daily with breakfast. 07/02/18  Yes Sowles, Danna Hefty, MD  pimecrolimus (ELIDEL) 1 % cream Apply topically 2 (two) times daily. 07/02/18  Yes Sowles, Danna Hefty, MD  triamcinolone cream (KENALOG) 0.1 % Apply 1 application topically 2 (two) times daily. 07/02/18  Yes Sowles, Danna Hefty, MD   fluconazole (DIFLUCAN) 150 MG tablet Take 1 tablet (150 mg total) by mouth every other day. Patient not taking: Reported on 08/10/2018 07/02/18   [name], MD  levonorgestrel (MIRENA, 52 MG,) 20 MCG/24HR IUD 1 Intra Uterine Device (1 each total) by Intrauterine route once for 1 dose. 06/15/18 06/15/18  [name], MD    Physical Exam Vitals:  Vitals:   08/10/18 1335  BP: 110/70  Pulse: (!) 54   Patient's last menstrual period was 07/14/2018 (exact date).  General: NAD HEENT: normocephalic, anicteric Pulmonary: No increased work of breathing Genitourinary:  External: Normal external female genitalia.  Normal urethral  meatus, normal Bartholin's and Skene's glands. Small red  papule on right side of perineum, pink in color, appears to be  a resolving ingrown hair. Small flesh colored raised area on  left side of perineum, not palpably enlarged  Vagina: Normal vaginal mucosa, no evidence of prolapse.    Cervix: Grossly normal in appearance, no bleeding, unable  to visualize IUD strings  Uterus: Non-enlarged, mobile, normal contour.  No CMT  Adnexa: ovaries non-enlarged, no adnexal masses  Rectal: deferred  Lymphatic: no evidence of inguinal lymphadenopathy Extremities: no edema, erythema, or tenderness Neurologic: Grossly intact Psychiatric: mood appropriate, affect full  Assessment: 27 y.o. [id number] for IUD check and examination of perienum.  Plan: Problem List Items Addressed This Visit    None    Visit Diagnoses    Encounter for management of intrauterine contraceptive device (IUD), unspecified IUD management type    -  Primary   Perineal rash, female         1) Discussed that although IUD strings cannot be visualized, it is likely that it remains in place because she would have noticed the device if it was being expelled, and would have unrelieved pain with a uterine perforation. Offered ultrasound today but she is reassured and declines. No left-sided pain in location  that cyst was seen on last ultrasound.  2) The two areas of concern on her perineum do not appear to be herpes lesions.   Marcelyn Bruins, [name] 08/10/2018  2:06 PM

## 2018-08-16 ENCOUNTER — Other Ambulatory Visit: Payer: Self-pay | Admitting: Family Medicine

## 2018-08-16 ENCOUNTER — Other Ambulatory Visit: Payer: Self-pay | Admitting: Certified Nurse Midwife

## 2018-08-16 DIAGNOSIS — L308 Other specified dermatitis: Secondary | ICD-10-CM

## 2018-09-02 ENCOUNTER — Encounter: Payer: Self-pay | Admitting: Family Medicine

## 2018-09-02 ENCOUNTER — Other Ambulatory Visit: Payer: Self-pay | Admitting: Family Medicine

## 2018-09-02 DIAGNOSIS — B373 Candidiasis of vulva and vagina: Secondary | ICD-10-CM

## 2018-09-02 DIAGNOSIS — B3731 Acute candidiasis of vulva and vagina: Secondary | ICD-10-CM

## 2018-09-02 MED ORDER — FLUCONAZOLE 150 MG PO TABS: 150.0000 mg | ORAL_TABLET | ORAL | 0 refills | Status: DC

## 2018-09-10 ENCOUNTER — Encounter: Payer: Self-pay | Admitting: Family Medicine

## 2018-09-30 ENCOUNTER — Ambulatory Visit: Payer: Medicaid Other | Admitting: Family Medicine

## 2018-12-10 ENCOUNTER — Ambulatory Visit: Payer: Medicaid Other | Admitting: Maternal Newborn

## 2018-12-15 ENCOUNTER — Ambulatory Visit: Payer: Self-pay | Admitting: Maternal Newborn

## 2019-01-11 ENCOUNTER — Encounter: Payer: Self-pay | Admitting: Maternal Newborn

## 2019-01-11 ENCOUNTER — Ambulatory Visit (INDEPENDENT_AMBULATORY_CARE_PROVIDER_SITE_OTHER): Payer: Medicaid Other | Admitting: Maternal Newborn

## 2019-01-11 VITALS — BP 118/64 | HR 85 | Ht 65.0 in | Wt 271.0 lb

## 2019-01-11 DIAGNOSIS — Z30431 Encounter for routine checking of intrauterine contraceptive device: Secondary | ICD-10-CM | POA: Diagnosis not present

## 2019-01-11 DIAGNOSIS — Z3009 Encounter for other general counseling and advice on contraception: Secondary | ICD-10-CM | POA: Diagnosis not present

## 2019-01-11 NOTE — Progress Notes (Signed)
Obstetrics & Gynecology Office Visit   Chief Complaint:  Chief Complaint  Patient presents with  . Follow-up    IUD check up, not liking the IUD she keeps having pains on either side and weight gain.    History of Present Illness: Orine would like to have her IUD removed due to symptoms that she reports since insertion of the device. She has had intermittent sharp lower abdominal pains on alternating sides for a few months. Nothing makes them better or worse. These pains make her worried that her IUD is not in the right location. She has also had about a 15 pound weight gain since insertion of the device. Her cycles are monthly, duration has shortened to a couple of days, and she had some intermenstrual spotting. She is unable to feel her IUD strings.  Review of Systems: Review of systems negative unless otherwise noted in HPI.  Past Medical History:  Past Medical History:  Diagnosis Date  . Allergy   . Depression   . Eczema   . GERD (gastroesophageal reflux disease)   . Intertrigo   . Obesity   . OCD (obsessive compulsive disorder)   . Panic attack     Past Surgical History:  Past Surgical History:  Procedure Laterality Date  . CHOLECYSTECTOMY  01/09/2011   Dr. Excell Seltzer    Gynecologic History: Patient's last menstrual period was 12/17/2018 (exact date).  Obstetric History: [id number]  Family History:  Family History  Problem Relation Age of Onset  . Hypertension Mother   . Diabetes Mother   . Headache Mother   . Hypertension Father   . Lung cancer Father   . Asthma Father   . Heart disease Son        Aortic Valve Disorder Stenosis-had balloon valvuloplasty, on meds  . Ovarian cancer Maternal Grandmother   . Colon cancer Maternal Grandfather     Social History:  Social History   Socioeconomic History  . Marital status: Single    Spouse name: Not on file  . Number of children: 3  . Years of education: high school  . Highest education level: High school  graduate  Occupational History    Employer: UNEMPLOYED  Social Needs  . Financial resource strain: Somewhat hard  . Food insecurity:    Worry: Never true    Inability: Never true  . Transportation needs:    Medical: No    Non-medical: No  Tobacco Use  . Smoking status: Former Games developer  . Smokeless tobacco: Never Used  Substance and Sexual Activity  . Alcohol use: No    Alcohol/week: 0.0 standard drinks  . Drug use: No  . Sexual activity: Yes    Partners: Male    Birth control/protection: I.U.D.    Comment: Mirena  Lifestyle  . Physical activity:    Days per week: 7 days    Minutes per session: 30 min  . Stress: Not at all  Relationships  . Social connections:    Talks on phone: More than three times a week    Gets together: More than three times a week    Attends religious service: More than 4 times per year    Active member of club or organization: No    Attends meetings of clubs or organizations: Never    Relationship status: Never married  . Intimate partner violence:    Fear of current or ex partner: No    Emotionally abused: No    Physically abused: No  Forced sexual activity: No  Other Topics Concern  . Not on file  Social History Narrative  . Not on file    Allergies:  No Known Allergies  Medications: Prior to Admission medications   Medication Sig Start Date End Date Taking? Authorizing Provider  valACYclovir (VALTREX) 500 MG tablet Take 1 tablet (500 mg total) by mouth 2 (two) times daily as needed. Prn outbreak x 5 days 08/16/18  Yes Farrel Conners, [name]  ELIDEL 1 % cream APPLY EXTERNALLY TO THE AFFECTED AREA TWICE DAILY Patient not taking: Reported on 01/11/2019 08/16/18   [name], MD  levonorgestrel (MIRENA, 52 MG,) 20 MCG/24HR IUD 1 Intra Uterine Device (1 each total) by Intrauterine route once for 1 dose. 06/15/18 06/15/18  [name], MD  triamcinolone cream (KENALOG) 0.1 % Apply 1 application topically 2 (two) times daily. Patient not  taking: Reported on 01/11/2019 07/02/18   [name], MD    Physical Exam Vitals:  Vitals:   01/11/19 1503  BP: 118/64  Pulse: 85   Patient's last menstrual period was 12/17/2018 (exact date).  General: NAD Pulmonary: No increased work of breathing Abdomen: Soft, non-tender, non-distended.   Genitourinary:  External: Normal external female genitalia.  Normal urethral  meatus, normal Bartholin's and Skene's glands.    Vagina: Normal vaginal mucosa, no evidence of prolapse.    Cervix: Grossly normal in appearance, no bleeding, cannot  visualize IUD strings   Uterus: Non-enlarged, mobile, normal contour.  No CMT Neurologic: Grossly intact Psychiatric: mood appropriate, affect full  Assessment: 28 y.o. [id number] with concern about IUD, desires removal.  Plan: Problem List Items Addressed This Visit    None    Visit Diagnoses    IUD check up    -  Primary     Imaging was done in August 2019 which showed IUD in the correct location. Strings were not visualized at that visit nor at a subsequent visit in October, when additional imaging was offered and declined.   After discussion, she is certain that she desires removal of the IUD. I could not locate the strings today after exploration of the cervical os.   We discussed that she will need an IUD removal under ultrasound guidance, and she plans to schedule a follow-up visit for this procedure.  We talked about contraception after the IUD is removed, with discussion of pills, the patch, and the vaginal ring. She plans to try the NuvaRing.  A total of 15 minutes were spent in face-to-face contact with the patient during this encounter with over half of that time devoted to counseling and coordination of care.  Marcelyn Bruins, [name] 01/12/2019

## 2019-01-12 ENCOUNTER — Encounter: Payer: Self-pay | Admitting: Maternal Newborn

## 2019-01-17 ENCOUNTER — Other Ambulatory Visit: Payer: Self-pay | Admitting: Obstetrics and Gynecology

## 2019-01-17 DIAGNOSIS — T8332XA Displacement of intrauterine contraceptive device, initial encounter: Secondary | ICD-10-CM

## 2019-01-19 ENCOUNTER — Other Ambulatory Visit: Payer: Medicaid Other

## 2019-01-19 ENCOUNTER — Ambulatory Visit: Payer: Medicaid Other | Admitting: Obstetrics and Gynecology

## 2019-01-19 ENCOUNTER — Ambulatory Visit: Payer: Medicaid Other | Admitting: Maternal Newborn

## 2019-05-12 ENCOUNTER — Encounter: Payer: Self-pay | Admitting: Family Medicine

## 2019-05-12 ENCOUNTER — Other Ambulatory Visit: Payer: Self-pay

## 2019-05-12 ENCOUNTER — Ambulatory Visit (INDEPENDENT_AMBULATORY_CARE_PROVIDER_SITE_OTHER): Payer: Medicaid Other | Admitting: Family Medicine

## 2019-05-12 DIAGNOSIS — L308 Other specified dermatitis: Secondary | ICD-10-CM | POA: Diagnosis not present

## 2019-05-12 DIAGNOSIS — F410 Panic disorder [episodic paroxysmal anxiety] without agoraphobia: Secondary | ICD-10-CM

## 2019-05-12 DIAGNOSIS — F341 Dysthymic disorder: Secondary | ICD-10-CM

## 2019-05-12 DIAGNOSIS — L409 Psoriasis, unspecified: Secondary | ICD-10-CM

## 2019-05-12 DIAGNOSIS — F429 Obsessive-compulsive disorder, unspecified: Secondary | ICD-10-CM

## 2019-05-12 MED ORDER — TRIAMCINOLONE ACETONIDE 0.1 % EX CREA: 1.0000 "application " | TOPICAL_CREAM | Freq: Two times a day (BID) | CUTANEOUS | 0 refills | Status: DC

## 2019-05-12 MED ORDER — HYDROXYZINE HCL 10 MG PO TABS: 10.0000 mg | ORAL_TABLET | Freq: Three times a day (TID) | ORAL | 0 refills | Status: DC | PRN

## 2019-05-12 MED ORDER — ESCITALOPRAM OXALATE 10 MG PO TABS: 10.0000 mg | ORAL_TABLET | Freq: Every day | ORAL | 0 refills | Status: DC

## 2019-05-12 MED ORDER — PIMECROLIMUS 1 % EX CREA: TOPICAL_CREAM | CUTANEOUS | 0 refills | Status: DC
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
## 2019-05-29 ENCOUNTER — Other Ambulatory Visit: Payer: Self-pay

## 2019-05-29 ENCOUNTER — Emergency Department: Payer: Medicaid Other

## 2019-05-29 DIAGNOSIS — R079 Chest pain, unspecified: Secondary | ICD-10-CM | POA: Diagnosis present

## 2019-05-29 DIAGNOSIS — Z87891 Personal history of nicotine dependence: Secondary | ICD-10-CM | POA: Diagnosis not present

## 2019-05-29 DIAGNOSIS — R5383 Other fatigue: Secondary | ICD-10-CM | POA: Diagnosis not present

## 2019-05-29 DIAGNOSIS — F329 Major depressive disorder, single episode, unspecified: Secondary | ICD-10-CM | POA: Diagnosis not present

## 2019-05-29 DIAGNOSIS — Z79899 Other long term (current) drug therapy: Secondary | ICD-10-CM | POA: Diagnosis not present

## 2019-05-29 LAB — CBC
HCT: 41.3 % (ref 36.0–46.0)
Hemoglobin: 13 g/dL (ref 12.0–15.0)
MCH: 24.3 pg — ABNORMAL LOW (ref 26.0–34.0)
MCHC: 31.5 g/dL (ref 30.0–36.0)
MCV: 77.3 fL — ABNORMAL LOW (ref 80.0–100.0)
Platelets: 249 10*3/uL (ref 150–400)
RBC: 5.34 MIL/uL — ABNORMAL HIGH (ref 3.87–5.11)
RDW: 15.2 % (ref 11.5–15.5)
WBC: 12.5 10*3/uL — ABNORMAL HIGH (ref 4.0–10.5)
nRBC: 0 % (ref 0.0–0.2)

## 2019-05-29 LAB — BASIC METABOLIC PANEL
Anion gap: 8 (ref 5–15)
BUN: 10 mg/dL (ref 6–20)
CO2: 24 mmol/L (ref 22–32)
Calcium: 8.9 mg/dL (ref 8.9–10.3)
Chloride: 104 mmol/L (ref 98–111)
Creatinine, Ser: 0.76 mg/dL (ref 0.44–1.00)
GFR calc Af Amer: >60 mL/min (ref >60)
GFR calc non Af Amer: >60 mL/min (ref >60)
Glucose, Bld: 105 mg/dL — ABNORMAL HIGH (ref 70–99)
Potassium: 3.6 mmol/L (ref 3.5–5.1)
Sodium: 136 mmol/L (ref 135–145)

## 2019-05-29 LAB — TROPONIN I (HIGH SENSITIVITY): Troponin I (High Sensitivity): <2 ng/L (ref <18)

## 2019-05-29 MED ORDER — SODIUM CHLORIDE 0.9% FLUSH: 3.0000 mL | Freq: Once | INTRAVENOUS | Status: DC

## 2019-05-29 NOTE — ED Triage Notes (Signed)
Patient reports fatigue for years, but now worse.  Also reports mid sternal, non radiating chest pain that started 2 hours prior to arrival.

## 2019-05-30 ENCOUNTER — Emergency Department
Admission: EM | Admit: 2019-05-30 | Discharge: 2019-05-30 | Disposition: A | Discharge status: Home / Self Care | Payer: Medicaid Other | Attending: Emergency Medicine | Admitting: Emergency Medicine

## 2019-05-30 DIAGNOSIS — R5383 Other fatigue: Secondary | ICD-10-CM

## 2019-05-30 DIAGNOSIS — R0789 Other chest pain: Secondary | ICD-10-CM

## 2019-05-30 LAB — URINALYSIS, COMPLETE (UACMP) WITH MICROSCOPIC
Bilirubin Urine: NEGATIVE
Glucose, UA: NEGATIVE mg/dL
Ketones, ur: NEGATIVE mg/dL
Nitrite: NEGATIVE
Protein, ur: 30 mg/dL — AB
Specific Gravity, Urine: 1.023 (ref 1.005–1.030)
pH: 6 (ref 5.0–8.0)

## 2019-05-30 LAB — TROPONIN I (HIGH SENSITIVITY): Troponin I (High Sensitivity): <2 ng/L (ref <18)

## 2019-05-30 LAB — POCT PREGNANCY, URINE: Preg Test, Ur: NEGATIVE

## 2019-05-30 MED ORDER — KETOROLAC TROMETHAMINE 30 MG/ML IJ SOLN
15.0000 mg | Freq: Once | INTRAMUSCULAR | Status: AC
  Administered 2019-05-30: 15 mg via INTRAVENOUS
  Filled 2019-05-30: qty 1

## 2019-05-30 MED ORDER — SODIUM CHLORIDE 0.9 % IV BOLUS
1000.0000 mL | Freq: Once | INTRAVENOUS | Status: AC
  Administered 2019-05-30: 1000 mL via INTRAVENOUS

## 2019-05-30 NOTE — ED Provider Notes (Signed)
[facility] Emergency Department Provider Note  ____________________________________________  Time seen: Approximately 12:50 AM  I have reviewed the triage vital signs and the nursing notes.   HISTORY  Chief Complaint Chest Pain and Fatigue   HPI Lisa House is a 28 y.o. female history of depression, IBS, headaches, GERD who presents for evaluation of chest pain and fatigue.  Patient reports that she has been feeling fatigued for several years.  Used to have a history of anemia.  Today she felt even more fatigued and had a brief episode of a sharp pain in the center of her chest that lasted a few minutes and resolved with no intervention. No SOB, fever, cough, abd pain, N/V/D. Patient was concerned that her anemia may be worse which prompted her visit to the ER. Also complaining of a mild 4/10, diffuse throbbing HA which is similar to h/o chronic HAs. No CP at this time.   Past Medical History:  Diagnosis Date  . Allergy   . Depression   . Eczema   . GERD (gastroesophageal reflux disease)   . Intertrigo   . Obesity   . OCD (obsessive compulsive disorder)   . Panic attack     Patient Active Problem List   Diagnosis Date Noted  . Family history of congenital aortic stenosis 09/23/2017  . Herpes genitalis 02/08/2017  . IBS (irritable bowel syndrome) 05/09/2015  . Morbid obesity (HCC) 05/09/2015  . OCD (obsessive compulsive disorder) 05/09/2015  . Bilateral polycystic ovarian syndrome 05/09/2015  . Psoriasis 05/09/2015  . History of depression 05/09/2015  . Allergic rhinitis 05/02/2010    Past Surgical History:  Procedure Laterality Date  . CHOLECYSTECTOMY  01/09/2011   Dr. Excell Seltzerooper    Prior to Admission medications   Medication Sig Start Date End Date Taking? Authorizing Provider  escitalopram (LEXAPRO) 10 MG tablet Take 1 tablet (10 mg total) by mouth daily. 05/12/19   Alba CorySowles, Krichna, MD  hydrOXYzine (ATARAX/VISTARIL) 10 MG tablet Take 1  tablet (10 mg total) by mouth every 8 (eight) hours as needed. 05/12/19   Alba CorySowles, Krichna, MD  levonorgestrel (MIRENA, 52 MG,) 20 MCG/24HR IUD 1 Intra Uterine Device (1 each total) by Intrauterine route once for 1 dose. 06/15/18 06/15/18  Alba CorySowles, Krichna, MD  pimecrolimus (ELIDEL) 1 % cream APPLY EXTERNALLY TO THE AFFECTED AREA TWICE DAILY 05/12/19   Alba CorySowles, Krichna, MD  triamcinolone cream (KENALOG) 0.1 % Apply 1 application topically 2 (two) times daily. 05/12/19   Alba CorySowles, Krichna, MD  valACYclovir (VALTREX) 500 MG tablet Take 1 tablet (500 mg total) by mouth 2 (two) times daily as needed. Prn outbreak x 5 days 08/16/18   [name], [name], [name]    Allergies Patient has no known allergies.  Family History  Problem Relation Age of Onset  . Hypertension Mother   . Diabetes Mother   . Headache Mother   . Hypertension Father   . Lung cancer Father   . Asthma Father   . Heart disease Son        Aortic Valve Disorder Stenosis-had balloon valvuloplasty, on meds  . Ovarian cancer Maternal Grandmother   . Colon cancer Maternal Grandfather     Social History Social History   Tobacco Use  . Smoking status: Former Games developermoker  . Smokeless tobacco: Never Used  Substance Use Topics  . Alcohol use: No    Alcohol/week: 0.0 standard drinks  . Drug use: No    Review of Systems  Constitutional: Negative for fever. + fatigue  Eyes: Negative for visual changes. ENT: Negative for sore throat. Neck: No neck pain  Cardiovascular: + chest pain. Respiratory: Negative for shortness of breath. Gastrointestinal: Negative for abdominal pain, vomiting or diarrhea. Genitourinary: Negative for dysuria. Musculoskeletal: Negative for back pain. Skin: Negative for rash. Neurological: Negative for weakness or numbness. + HA Psych: No SI or HI  ____________________________________________   PHYSICAL EXAM:  VITAL SIGNS: ED Triage Vitals  Enc Vitals Group     BP 05/29/19 1947 126/75     Pulse Rate 05/29/19 1947  85     Resp 05/29/19 1947 18     Temp 05/29/19 1947 98.8 F (37.1 C)     Temp Source 05/29/19 1947 Oral     SpO2 05/29/19 1947 99 %     Weight 05/29/19 1948 242 lb (109.8 kg)     Height 05/29/19 1948 5\' 6"  (1.676 m)     Head Circumference --      Peak Flow --      Pain Score 05/29/19 1948 7     Pain Loc --      Pain Edu? --      Excl. in [place]? --     Constitutional: Alert and oriented. Well appearing and in no apparent distress. HEENT:      Head: Normocephalic and atraumatic.         Eyes: Conjunctivae are normal. Sclera is non-icteric.       Mouth/Throat: Mucous membranes are moist.       Neck: Supple with no signs of meningismus. Cardiovascular: Regular rate and rhythm. No murmurs, gallops, or rubs. 2+ symmetrical distal pulses are present in all extremities. No JVD. Respiratory: Normal respiratory effort. Lungs are clear to auscultation bilaterally. No wheezes, crackles, or rhonchi.  Gastrointestinal: Soft, non tender, and non distended with positive bowel sounds. No rebound or guarding. Musculoskeletal: Nontender with normal range of motion in all extremities. No edema, cyanosis, or erythema of extremities. Neurologic: Normal speech and language. Face is symmetric. Moving all extremities. No gross focal neurologic deficits are appreciated. Skin: Skin is warm, dry and intact. No rash noted. Psychiatric: Mood and affect are normal. Speech and behavior are normal.  ____________________________________________   LABS (all labs ordered are listed, but only abnormal results are displayed)  Labs Reviewed  BASIC METABOLIC PANEL - Abnormal; Notable for the following components:      Result Value   Glucose, Bld 105 (*)    All other components within normal limits  CBC - Abnormal; Notable for the following components:   WBC 12.5 (*)    RBC 5.34 (*)    MCV 77.3 (*)    MCH 24.3 (*)    All other components within normal limits  URINALYSIS, COMPLETE (UACMP) WITH MICROSCOPIC -  Abnormal; Notable for the following components:   Color, Urine YELLOW (*)    APPearance CLOUDY (*)    Hgb urine dipstick SMALL (*)    Protein, ur 30 (*)    Leukocytes,Ua SMALL (*)    Bacteria, UA RARE (*)    All other components within normal limits  POC URINE PREG, ED  POCT PREGNANCY, URINE  TROPONIN I (HIGH SENSITIVITY)  TROPONIN I (HIGH SENSITIVITY)   ____________________________________________  EKG  ED ECG REPORT I, Nita Sicklearolina Johnnie Goynes, the attending physician, personally viewed and interpreted this ECG.   Normal sinus rhythm, rate of 82, normal intervals, normal axis, no ST elevations or depressions, T wave inversion in lead III. Unchanged from prior. ____________________________________________  RADIOLOGY  I  have personally reviewed the images performed during this visit and I agree with the Radiologist's read.   Interpretation by Radiologist:  Dg Chest 2 View  Result Date: 05/29/2019 CLINICAL DATA:  Chest pain. EXAM: CHEST - 2 VIEW COMPARISON:  08/02/2016 FINDINGS: The cardiomediastinal silhouette is within normal limits. The lungs are well inflated and clear. There is no evidence of pleural effusion or pneumothorax. No acute osseous abnormality is identified. IMPRESSION: No active cardiopulmonary disease. Electronically Signed   By: Logan Bores M.D.   On: 05/29/2019 20:07     ____________________________________________   PROCEDURES  Procedure(s) performed: None Procedures Critical Care performed:  None ____________________________________________   INITIAL IMPRESSION / ASSESSMENT AND PLAN / ED COURSE   28 y.o. female history of depression, IBS, headaches, GERD who presents for evaluation of brief episode of chest pain, fatigue, and mild HA.  Patient is extremely well-appearing and in no distress with normal vital signs, exam is benign with no acute findings.  EKG showing no acute ischemic changes.  High sensitive troponin x2-.  Labs show no evidence of  dehydration, anemia, or electrolyte abnormalities.  Chest x-ray with no evidence of pneumothorax, edema, pneumonia.  Low suspicion for ACS with a heart score of 1 from smoking. PERC negative. Low suspicion for PE with resolved symptoms. Low suspicion for COVID with no cough, SOB or fever. Urine looks very concentrated, possibly some dehydration. IVF given. Ibuprofen given for HA. Pregnancy test negative    _________________________ 2:38 AM on 05/30/2019 -----------------------------------------  After IV fluids and ibuprofen patient feels markedly improved.  No longer having any headache.  Recommended follow-up with her PCP for evaluation of chronic fatigue.  Discussed my standard return precautions.    As part of my medical decision making, I reviewed the following data within the [place] notes reviewed and incorporated, Labs reviewed , EKG interpreted , Old chart reviewed, Radiograph reviewed , Notes from prior ED visits and [place] Controlled Substance Database   Patient was evaluated in Emergency Department today for the symptoms described in the history of present illness. Patient was evaluated in the context of the global COVID-19 pandemic, which necessitated consideration that the patient might be at risk for infection with the SARS-CoV-2 virus that causes COVID-19. Institutional protocols and algorithms that pertain to the evaluation of patients at risk for COVID-19 are in a state of rapid change based on information released by regulatory bodies including the CDC and federal and state organizations. These policies and algorithms were followed during the patient's care in the ED.   ____________________________________________   FINAL CLINICAL IMPRESSION(S) / ED DIAGNOSES   Final diagnoses:  Fatigue, unspecified type  Atypical chest pain      NEW MEDICATIONS STARTED DURING THIS VISIT:  ED Discharge Orders    None       Note:  This document was  prepared using Dragon voice recognition software and may include unintentional dictation errors.    Rudene Re, MD 05/30/19 [phone]

## 2019-07-08 ENCOUNTER — Ambulatory Visit (INDEPENDENT_AMBULATORY_CARE_PROVIDER_SITE_OTHER): Payer: Medicaid Other | Admitting: Family Medicine

## 2019-07-08 ENCOUNTER — Encounter: Payer: Self-pay | Admitting: Family Medicine

## 2019-07-08 ENCOUNTER — Other Ambulatory Visit: Payer: Self-pay

## 2019-07-08 VITALS — Temp 98.7°F

## 2019-07-08 DIAGNOSIS — R300 Dysuria: Secondary | ICD-10-CM

## 2019-07-08 MED ORDER — NITROFURANTOIN MONOHYD MACRO 100 MG PO CAPS: 100.0000 mg | ORAL_CAPSULE | Freq: Two times a day (BID) | ORAL | 0 refills | Status: DC

## 2019-07-08 NOTE — Progress Notes (Signed)
Name: Lisa House   MRN: [id number]    DOB: 03/02/1991   Date:07/08/2019       Progress Note  Subjective  Chief Complaint  Chief Complaint  Patient presents with  . Urinary Tract Infection    She feel like her bladder is full all the time. She has dysuria lower abdominal pain and frequency. Onset was on Tuesday of this week. Symptoms are constant unless she takes Azo it gradually improves. She denies fever, hematuria, back pain.    I connected with  [name]  on 07/08/19 at 11:00 AM EDT by a video enabled telemedicine application and verified that I am speaking with the correct person using two identifiers.  I discussed the limitations of evaluation and management by telemedicine and the availability of in person appointments. The patient expressed understanding and agreed to proceed. Staff also discussed with the patient that there may be a patient responsible charge related to this service. Patient Location: at home Provider Location: [facility]   HPI  Dysuria: she has noticed dysuria, supra pubic pressure, some mild low back pain that started suddenly 3 days ago. No hematuria, fever or chills. She has tried AZO with some temporary improvement of symptoms. No nausea or vomiting. Denies change in sexual partner or vaginal discharge.   Patient Active Problem List   Diagnosis Date Noted  . Family history of congenital aortic stenosis 09/23/2017  . Herpes genitalis 02/08/2017  . IBS (irritable bowel syndrome) 05/09/2015  . Morbid obesity (HCC) 05/09/2015  . OCD (obsessive compulsive disorder) 05/09/2015  . Bilateral polycystic ovarian syndrome 05/09/2015  . Psoriasis 05/09/2015  . History of depression 05/09/2015  . Allergic rhinitis 05/02/2010    Past Surgical History:  Procedure Laterality Date  . CHOLECYSTECTOMY  01/09/2011   Dr. Excell Seltzer    Family History  Problem Relation Age of Onset  . Hypertension Mother   . Diabetes Mother   . Headache  Mother   . Hypertension Father   . Lung cancer Father   . Asthma Father   . Heart disease Son        Aortic Valve Disorder Stenosis-had balloon valvuloplasty, on meds  . Ovarian cancer Maternal Grandmother   . Colon cancer Maternal Grandfather     Social History   Socioeconomic History  . Marital status: Single    Spouse name: Not on file  . Number of children: 3  . Years of education: high school  . Highest education level: High school graduate  Occupational History    Employer: UNEMPLOYED  Social Needs  . Financial resource strain: Somewhat hard  . Food insecurity    Worry: Never true    Inability: Never true  . Transportation needs    Medical: No    Non-medical: No  Tobacco Use  . Smoking status: Former Games developer  . Smokeless tobacco: Never Used  Substance and Sexual Activity  . Alcohol use: No    Alcohol/week: 0.0 standard drinks  . Drug use: No  . Sexual activity: Yes    Partners: Male    Birth control/protection: I.U.D.    Comment: Mirena  Lifestyle  . Physical activity    Days per week: 7 days    Minutes per session: 30 min  . Stress: Not at all  Relationships  . Social connections    Talks on phone: More than three times a week    Gets together: More than three times a week    Attends religious service:  More than 4 times per year    Active member of club or organization: No    Attends meetings of clubs or organizations: Never    Relationship status: Never married  . Intimate partner violence    Fear of current or ex partner: No    Emotionally abused: No    Physically abused: No    Forced sexual activity: No  Other Topics Concern  . Not on file  Social History Narrative  . Not on file     Current Outpatient Medications:  .  escitalopram (LEXAPRO) 10 MG tablet, Take 1 tablet (10 mg total) by mouth daily., Disp: 30 tablet, Rfl: 0 .  hydrOXYzine (ATARAX/VISTARIL) 10 MG tablet, Take 1 tablet (10 mg total) by mouth every 8 (eight) hours as needed.,  Disp: 30 tablet, Rfl: 0 .  pimecrolimus (ELIDEL) 1 % cream, APPLY EXTERNALLY TO THE AFFECTED AREA TWICE DAILY, Disp: 100 g, Rfl: 0 .  triamcinolone cream (KENALOG) 0.1 %, Apply 1 application topically 2 (two) times daily., Disp: 453.6 g, Rfl: 0 .  valACYclovir (VALTREX) 500 MG tablet, Take 1 tablet (500 mg total) by mouth 2 (two) times daily as needed. Prn outbreak x 5 days, Disp: 30 tablet, Rfl: 1 .  levonorgestrel (MIRENA, 52 MG,) 20 MCG/24HR IUD, 1 Intra Uterine Device (1 each total) by Intrauterine route once for 1 dose., Disp: 1 each, Rfl: 0  No Known Allergies  I personally reviewed active problem list, medication list, allergies, family history, social history with the patient/caregiver today.   ROS  Ten systems reviewed and is negative except as mentioned in HPI she has psoriasis  Objective  Virtual encounter, vitals not obtained.  There is no height or weight on file to calculate BMI.  Physical Exam  Awake, alert and oriented   PHQ2/9: Depression screen [facility] 07/08/2019 05/12/2019 07/02/2018 11/12/2016 05/09/2015  Decreased Interest 0 1 0 0 0  Down, Depressed, Hopeless 0 1 0 0 0  PHQ - 2 Score 0 2 0 0 0  Altered sleeping 0 0 0 - -  Tired, decreased energy 1 1 3  - -  Change in appetite 0 0 0 - -  Feeling bad or failure about yourself  0 0 0 - -  Trouble concentrating 0 0 0 - -  Moving slowly or fidgety/restless 0 0 0 - -  Suicidal thoughts 0 0 0 - -  PHQ-9 Score 1 3 3  - -  Difficult doing work/chores Not difficult at all Not difficult at all Not difficult at all - -   PHQ-2/9 Result is negative, she has been worried about son that recently had cleft palate repair and had complications, tired from not sleeping well.     Fall Risk: Fall Risk  07/08/2019 05/12/2019 07/02/2018 05/09/2015  Falls in the past year? 0 0 Yes Yes  Number falls in past yr: 0 0 1 -  Injury with Fall? 0 0 No No    Assessment & Plan  1. Dysuria  - nitrofurantoin, macrocrystal-monohydrate,  (MACROBID) 100 MG capsule; Take 1 capsule (100 mg total) by mouth 2 (two) times daily.  Dispense: 10 capsule; Refill: 0  Advised her to come in to drop off urine for culture if no resolution of symptoms, to take all the tabs, drink fluids  I discussed the assessment and treatment plan with the patient. The patient was provided an opportunity to ask questions and all were answered. The patient agreed with the plan and demonstrated an understanding of  the instructions.  The patient was advised to call back or seek an in-person evaluation if the symptoms worsen or if the condition fails to improve as anticipated.  I provided 15  minutes of non-face-to-face time during this encounter.

## 2019-07-13 ENCOUNTER — Other Ambulatory Visit: Payer: Self-pay | Admitting: Family Medicine

## 2019-07-13 DIAGNOSIS — F410 Panic disorder [episodic paroxysmal anxiety] without agoraphobia: Secondary | ICD-10-CM

## 2019-07-13 NOTE — Telephone Encounter (Signed)
Copied from [place] [id number]. Topic: General - Other >> Jul 13, 2019 10:58 AM Yvette Rack wrote: Reason for CRM: Pt stated that she needs the hydrOXYzine (ATARAX/VISTARIL) 10 MG tablet to be refilled due to the medication falling in to the sink and getting wet. Pt stated there were only a few pills left. Pt would like the Rx to be sent to [facility], [place]

## 2019-08-15 ENCOUNTER — Other Ambulatory Visit: Payer: Self-pay

## 2019-08-15 ENCOUNTER — Emergency Department
Admission: EM | Admit: 2019-08-15 | Discharge: 2019-08-15 | Disposition: A | Discharge status: Home / Self Care | Payer: Medicaid Other | Attending: Emergency Medicine | Admitting: Emergency Medicine

## 2019-08-15 ENCOUNTER — Emergency Department: Payer: Medicaid Other

## 2019-08-15 DIAGNOSIS — N83201 Unspecified ovarian cyst, right side: Secondary | ICD-10-CM | POA: Insufficient documentation

## 2019-08-15 DIAGNOSIS — R1030 Lower abdominal pain, unspecified: Secondary | ICD-10-CM | POA: Diagnosis present

## 2019-08-15 DIAGNOSIS — R102 Pelvic and perineal pain: Secondary | ICD-10-CM | POA: Diagnosis not present

## 2019-08-15 DIAGNOSIS — R309 Painful micturition, unspecified: Secondary | ICD-10-CM | POA: Diagnosis not present

## 2019-08-15 DIAGNOSIS — Z79899 Other long term (current) drug therapy: Secondary | ICD-10-CM | POA: Diagnosis not present

## 2019-08-15 LAB — COMPREHENSIVE METABOLIC PANEL
ALT: 21 U/L (ref 0–44)
AST: 20 U/L (ref 15–41)
Albumin: 3.7 g/dL (ref 3.5–5.0)
Alkaline Phosphatase: 90 U/L (ref 38–126)
Anion gap: 10 (ref 5–15)
BUN: 13 mg/dL (ref 6–20)
CO2: 26 mmol/L (ref 22–32)
Calcium: 9 mg/dL (ref 8.9–10.3)
Chloride: 97 mmol/L — ABNORMAL LOW (ref 98–111)
Creatinine, Ser: 0.83 mg/dL (ref 0.44–1.00)
GFR calc Af Amer: >60 mL/min (ref >60)
GFR calc non Af Amer: >60 mL/min (ref >60)
Glucose, Bld: 97 mg/dL (ref 70–99)
Potassium: 3.8 mmol/L (ref 3.5–5.1)
Sodium: 133 mmol/L — ABNORMAL LOW (ref 135–145)
Total Bilirubin: 0.3 mg/dL (ref 0.3–1.2)
Total Protein: 7.7 g/dL (ref 6.5–8.1)

## 2019-08-15 LAB — URINALYSIS, COMPLETE (UACMP) WITH MICROSCOPIC
Bacteria, UA: NONE SEEN
Bilirubin Urine: NEGATIVE
Glucose, UA: NEGATIVE mg/dL
Ketones, ur: NEGATIVE mg/dL
Leukocytes,Ua: NEGATIVE
Nitrite: NEGATIVE
Protein, ur: NEGATIVE mg/dL
Specific Gravity, Urine: 1.005 (ref 1.005–1.030)
WBC, UA: NONE SEEN WBC/hpf (ref 0–5)
pH: 6 (ref 5.0–8.0)

## 2019-08-15 LAB — WET PREP, GENITAL
Clue Cells Wet Prep HPF POC: NONE SEEN
Sperm: NONE SEEN
Trich, Wet Prep: NONE SEEN
Yeast Wet Prep HPF POC: NONE SEEN

## 2019-08-15 LAB — CBC
HCT: 38.2 % (ref 36.0–46.0)
Hemoglobin: 12.4 g/dL (ref 12.0–15.0)
MCH: 26.5 pg (ref 26.0–34.0)
MCHC: 32.5 g/dL (ref 30.0–36.0)
MCV: 81.6 fL (ref 80.0–100.0)
Platelets: 212 10*3/uL (ref 150–400)
RBC: 4.68 MIL/uL (ref 3.87–5.11)
RDW: 15.6 % — ABNORMAL HIGH (ref 11.5–15.5)
WBC: 13.5 10*3/uL — ABNORMAL HIGH (ref 4.0–10.5)
nRBC: 0 % (ref 0.0–0.2)

## 2019-08-15 LAB — PREGNANCY, URINE: Preg Test, Ur: NEGATIVE

## 2019-08-15 LAB — LIPASE, BLOOD: Lipase: 33 U/L (ref 11–51)

## 2019-08-15 MED ORDER — SODIUM CHLORIDE 0.9% FLUSH: 3.0000 mL | Freq: Once | INTRAVENOUS | Status: DC

## 2019-08-15 NOTE — ED Provider Notes (Signed)
[facility] Emergency Department Provider Note  Time seen: 8:42 PM  I have reviewed the triage vital signs and the nursing notes.   HISTORY  Chief Complaint Abdominal Pain   HPI Lisa House is a 28 y.o. female with a past medical history of gastric reflux, obesity, anxiety, presents to the emergency department for lower abdominal discomfort.  According to the patient  for the past 4 to 5 days she has been experiencing lower abdominal pain and pain with urination.  States she has pressure in her pelvic region but denies any dysuria or hematuria.  Denies any increased vaginal discharge or vaginal bleeding.  Patient has an IUD intact but can no longer feel strings.  Denies any fever cough or shortness of breath.  Past Medical History:  Diagnosis Date  . Allergy   . Depression   . Eczema   . GERD (gastroesophageal reflux disease)   . Intertrigo   . Obesity   . OCD (obsessive compulsive disorder)   . Panic attack     Patient Active Problem List   Diagnosis Date Noted  . Family history of congenital aortic stenosis 09/23/2017  . Herpes genitalis 02/08/2017  . IBS (irritable bowel syndrome) 05/09/2015  . Morbid obesity (HCC) 05/09/2015  . OCD (obsessive compulsive disorder) 05/09/2015  . Bilateral polycystic ovarian syndrome 05/09/2015  . Psoriasis 05/09/2015  . History of depression 05/09/2015  . Allergic rhinitis 05/02/2010    Past Surgical History:  Procedure Laterality Date  . CHOLECYSTECTOMY  01/09/2011   Dr. Excell Seltzer    Prior to Admission medications   Medication Sig Start Date End Date Taking? Authorizing Provider  escitalopram (LEXAPRO) 10 MG tablet Take 1 tablet (10 mg total) by mouth daily. 05/12/19   [name], MD  hydrOXYzine (ATARAX/VISTARIL) 10 MG tablet TAKE 1 TABLET(10 MG) BY MOUTH EVERY 8 HOURS AS NEEDED 07/13/19   [name], MD  levonorgestrel (MIRENA, 52 MG,) 20 MCG/24HR IUD 1 Intra Uterine Device (1 each total) by  Intrauterine route once for 1 dose. 06/15/18 06/15/18  [name], MD  nitrofurantoin, macrocrystal-monohydrate, (MACROBID) 100 MG capsule Take 1 capsule (100 mg total) by mouth 2 (two) times daily. 07/08/19   Sowles, Danna Hefty, MD  pimecrolimus (ELIDEL) 1 % cream APPLY EXTERNALLY TO THE AFFECTED AREA TWICE DAILY 05/12/19   [name], MD  triamcinolone cream (KENALOG) 0.1 % Apply 1 application topically 2 (two) times daily. 05/12/19   [name], MD  valACYclovir (VALTREX) 500 MG tablet Take 1 tablet (500 mg total) by mouth 2 (two) times daily as needed. Prn outbreak x 5 days 08/16/18   Farrel Conners, [name]    No Known Allergies  Family History  Problem Relation Age of Onset  . Hypertension Mother   . Diabetes Mother   . Headache Mother   . Hypertension Father   . Lung cancer Father   . Asthma Father   . Heart disease Son        Aortic Valve Disorder Stenosis-had balloon valvuloplasty, on meds  . Ovarian cancer Maternal Grandmother   . Colon cancer Maternal Grandfather     Social History Social History   Tobacco Use  . Smoking status: Former Games developer  . Smokeless tobacco: Never Used  Substance Use Topics  . Alcohol use: No    Alcohol/week: 0.0 standard drinks  . Drug use: No    Review of Systems Constitutional: Negative for fever Cardiovascular: Negative for chest pain. Respiratory: Negative for shortness of breath. Gastrointestinal: Negative for  abdominal pain, vomiting Musculoskeletal: Negative for musculoskeletal complaints Neurological: Negative for headache All other ROS negative  ____________________________________________   PHYSICAL EXAM:  VITAL SIGNS: ED Triage Vitals  Enc Vitals Group     BP 08/15/19 1916 (!) 148/91     Pulse Rate 08/15/19 1916 70     Resp 08/15/19 1916 18     Temp 08/15/19 1916 97.7 F (36.5 C)     Temp Source 08/15/19 1916 Oral     SpO2 08/15/19 1916 99 %     Weight 08/15/19 1917 246 lb (111.6 kg)     Height 08/15/19 1917  5\' 6"  (1.676 m)     Head Circumference --      Peak Flow --      Pain Score 08/15/19 1916 6     Pain Loc --      Pain Edu? --      Excl. in [place]? --    Constitutional: Alert and oriented. Well appearing and in no distress. Eyes: Normal exam ENT      Head: Normocephalic and atraumatic.      Mouth/Throat: Mucous membranes are moist. Cardiovascular: Normal rate, regular rhythm. No murmur Respiratory: Normal respiratory effort without tachypnea nor retractions. Breath sounds are clear Gastrointestinal: Soft and nontender. No distention.  Obese. Musculoskeletal: Nontender with normal range of motion in all extremities. Neurologic:  Normal speech and language. No gross focal neurologic deficits  Skin:  Skin is warm, dry and intact.  Psychiatric: Mood and affect are normal.   ____________________________________________   RADIOLOGY  [place] pending  ____________________________________________   INITIAL IMPRESSION / ASSESSMENT AND PLAN / ED COURSE  Pertinent labs & imaging results that were available during my care of the patient were reviewed by me and considered in my medical decision making (see chart for details).   Patient presents to the emergency department for lower abdominal discomfort.  Differential would include pelvic infection, UTI pyelonephritis, ovarian cysts, malpositioned IUD.  Overall the patient appears extremely well with a benign abdominal exam.  Lab work shows a slight leukocytosis otherwise largely within normal limits including a normal urinalysis.  Pelvic exam performed by myself is nontender with a normal amount of white discharge, IUD strings are present in the cervix.  We will proceed with a pelvic ultrasound to further evaluate.  Patient agreeable to plan of care.  Wet prep pending.  Lisa House was evaluated in Emergency Department on 08/15/2019 for the symptoms described in the history of present illness. She was evaluated in the context of the global  COVID-19 pandemic, which necessitated consideration that the patient might be at risk for infection with the SARS-CoV-2 virus that causes COVID-19. Institutional protocols and algorithms that pertain to the evaluation of patients at risk for COVID-19 are in a state of rapid change based on information released by regulatory bodies including the CDC and federal and state organizations. These policies and algorithms were followed during the patient's care in the ED.  ____________________________________________   FINAL CLINICAL IMPRESSION(S) / ED DIAGNOSES  Pelvic pain   Harvest Dark, MD 08/16/19 [id number]

## 2019-08-15 NOTE — ED Triage Notes (Signed)
The er for lower abd pain on Thursday and pain with urination. Pt has pressure in her stomach and in her thighs when she pees.

## 2019-08-15 NOTE — Discharge Instructions (Signed)
Please have a repeat US performed in 6-12 weeks to make sure that the cyst has resolved. Please seek medical attention for any high fevers, chest pain, shortness of breath, change in behavior, persistent vomiting, bloody stool or any other new or concerning symptoms.

## 2019-08-15 NOTE — Progress Notes (Signed)
PCP:  Steele Sizer, MD   Chief Complaint  Patient presents with  . Gynecologic Exam    flu shot today  . Pelvic Pain    ER last night and told right ovarian cyst     HPI:      [name] is a 28 y.o. [phone] who LMP was No LMP recorded. (Menstrual status: IUD)., presents today for her annual examination.  Her menses are infrequent spotting with IUD. Dysmenorrhea none.   Sex activity: single partner, contraception - IUD. Mirena placed 06/25/18; IUD strings not visible in past but IUD in correct location per 07/05/18 u/s and 08/15/19 u/s.   Pt went to ED last night for several days of dull RLQ pain, now radiating to back and LLQ. Hasn't taken any meds for it. Had neg UPT, neg UA. Noted to have LTO hemorrhagic cyst 8/19 on u/s at our office;  GYN u/s yesterday in ED showed resolution, but now has 3.5 RTO complex cyst. U/S f/u due in 8-12 wks. Hx of constipation/diarrhea/IBS, but pelvic pain sx not related to GI sx. Hx of degenerative low back issues since teenager and has back pain in general.   Last Pap: June 15, 2018  Results were: no abnormalities  Hx of STDs: HSV, treats with valtrex prn sx; sx every few months. Needs Rx RF.  There is no FH of breast cancer. There is no FH of ovarian cancer. The patient does do self-breast exams.  Tobacco use: The patient denies current or previous tobacco use. Alcohol use: social drinker No drug use.  Exercise: moderately active  She does get adequate calcium but not Vitamin D in her diet.   Past Medical History:  Diagnosis Date  . Allergy   . Depression   . Eczema   . GERD (gastroesophageal reflux disease)   . Intertrigo   . Obesity   . OCD (obsessive compulsive disorder)   . Panic attack     Past Surgical History:  Procedure Laterality Date  . CHOLECYSTECTOMY  01/09/2011   Dr. Burt Knack    Family History  Problem Relation Age of Onset  . Hypertension Mother   . Diabetes Mother   . Headache Mother   . Hypertension  Father   . Lung cancer Father   . Asthma Father   . Heart disease Son        Aortic Valve Disorder Stenosis-had balloon valvuloplasty, on meds  . Ovarian cancer Maternal Grandmother   . Colon cancer Maternal Grandfather     Social History   Socioeconomic History  . Marital status: Single    Spouse name: Not on file  . Number of children: 3  . Years of education: high school  . Highest education level: High school graduate  Occupational History    Employer: UNEMPLOYED  Social Needs  . Financial resource strain: Somewhat hard  . Food insecurity    Worry: Never true    Inability: Never true  . Transportation needs    Medical: No    Non-medical: No  Tobacco Use  . Smoking status: Former Research scientist (life sciences)  . Smokeless tobacco: Never Used  Substance and Sexual Activity  . Alcohol use: No    Alcohol/week: 0.0 standard drinks  . Drug use: No  . Sexual activity: Yes    Partners: Male    Birth control/protection: I.U.D.    Comment: Mirena  Lifestyle  . Physical activity    Days per week: 7 days    Minutes per session:  30 min  . Stress: Not at all  Relationships  . Social connections    Talks on phone: More than three times a week    Gets together: More than three times a week    Attends religious service: More than 4 times per year    Active member of club or organization: No    Attends meetings of clubs or organizations: Never    Relationship status: Never married  . Intimate partner violence    Fear of current or ex partner: No    Emotionally abused: No    Physically abused: No    Forced sexual activity: No  Other Topics Concern  . Not on file  Social History Narrative  . Not on file     Current Outpatient Medications:  .  pimecrolimus (ELIDEL) 1 % cream, APPLY EXTERNALLY TO THE AFFECTED AREA TWICE DAILY, Disp: 100 g, Rfl: 0 .  triamcinolone cream (KENALOG) 0.1 %, Apply 1 application topically 2 (two) times daily., Disp: 453.6 g, Rfl: 0 .  valACYclovir (VALTREX) 500 MG  tablet, Take 1 tablet (500 mg total) by mouth 2 (two) times daily as needed. Prn outbreak x 3 days, Disp: 30 tablet, Rfl: 1 .  levonorgestrel (MIRENA, 52 MG,) 20 MCG/24HR IUD, 1 Intra Uterine Device (1 each total) by Intrauterine route once for 1 dose., Disp: 1 each, Rfl: 0     ROS:  Review of Systems  Constitutional: Positive for fatigue. Negative for fever and unexpected weight change.  Respiratory: Negative for cough, shortness of breath and wheezing.   Cardiovascular: Negative for chest pain, palpitations and leg swelling.  Gastrointestinal: Positive for constipation and diarrhea. Negative for blood in stool, nausea and vomiting.  Endocrine: Negative for cold intolerance, heat intolerance and polyuria.  Genitourinary: Positive for dyspareunia. Negative for dysuria, flank pain, frequency, genital sores, hematuria, menstrual problem, pelvic pain, urgency, vaginal bleeding, vaginal discharge and vaginal pain.  Musculoskeletal: Positive for back pain. Negative for arthralgias, joint swelling and myalgias.  Skin: Negative for rash.  Neurological: Negative for dizziness, syncope, light-headedness, numbness and headaches.  Hematological: Negative for adenopathy.  Psychiatric/Behavioral: Negative for agitation, confusion, sleep disturbance and suicidal ideas. The patient is not nervous/anxious.   BREAST: No symptoms   Objective: BP 100/80   Ht 5\' 6"  (1.676 m)   Wt 254 lb (115.2 kg)   Breastfeeding No   BMI 41.00 kg/m    Physical Exam Constitutional:      Appearance: She is well-developed.  Genitourinary:     Vulva, vagina, cervix, uterus, right adnexa and left adnexa normal.     No vulval lesion or tenderness noted.     No vaginal discharge, erythema or tenderness.     No cervical polyp.     IUD strings visualized.     Uterus is not enlarged or tender.     No right or left adnexal mass present.     Right adnexa not tender.     Left adnexa not tender.  Neck:      Musculoskeletal: Normal range of motion.     Thyroid: No thyromegaly.  Cardiovascular:     Rate and Rhythm: Normal rate and regular rhythm.     Heart sounds: Normal heart sounds. No murmur.  Pulmonary:     Effort: Pulmonary effort is normal.     Breath sounds: Normal breath sounds.  Chest:     Breasts:        Right: No mass, nipple discharge, skin change or tenderness.  Left: No mass, nipple discharge, skin change or tenderness.  Abdominal:     Palpations: Abdomen is soft.     Tenderness: There is no abdominal tenderness. There is no guarding.  Musculoskeletal: Normal range of motion.  Neurological:     General: No focal deficit present.     Mental Status: She is alert and oriented to person, place, and time.     Cranial Nerves: No cranial nerve deficit.  Skin:    General: Skin is warm and dry.  Psychiatric:        Mood and Affect: Mood normal.        Behavior: Behavior normal.        Thought Content: Thought content normal.        Judgment: Judgment normal.  Vitals signs reviewed.     Assessment/Plan: Encounter for annual routine gynecological examination  Encounter for routine checking of intrauterine contraceptive device (IUD)--IUD strings in cx os, in correct location on u/s yesterday.  Pelvic pain--Neg exam. RTO cyst on u/s. Could be coincidental. No bladder sx. Question MSK given degenerative back issues. LBP stretch/core exercises/wt loss.  Right ovarian cyst - Plan: US PELVIS TRANSVAGINAL NON-OB (TV ONLY)--repeat u/s in 8-12 wks.   Genital herpes simplex, unspecified site - Plan: valACYclovir (VALTREX) 500 MG tablet; Rx RF valtrex.  Needs flu shot - Plan: Flu Vaccine QUAD 36+ mos IM (Fluarix, Quad PF)  Meds ordered this encounter  Medications  . valACYclovir (VALTREX) 500 MG tablet    Sig: Take 1 tablet (500 mg total) by mouth 2 (two) times daily as needed. Prn outbreak x 3 days    Dispense:  30 tablet    Refill:  1    Order Specific Question:    Supervising Provider    Answer:   Nadara MustardHARRIS, ROBERT P [[id number]]             GYN counsel adequate intake of calcium and vitamin D, diet and exercise     F/U  Return in about 8 weeks (around 10/11/2019) for 8-12 wks for GYN u/s for RTO cyst--[facility] to call pt.  Alicia B. Copland, PA-C 08/16/2019 12:03 PM

## 2019-08-15 NOTE — ED Provider Notes (Signed)
Ultrasound showed a 3.5 cm cyst like lesion to the right ovary.  I do think this could explain the patient's discomfort.  Discussed the finding with the patient.  Discussed with patient portance of follow-up with her OB/GYN for repeat imaging and evaluation.   Nance Pear, MD 08/15/19 [phone]

## 2019-08-16 ENCOUNTER — Encounter: Payer: Self-pay | Admitting: Obstetrics and Gynecology

## 2019-08-16 ENCOUNTER — Ambulatory Visit (INDEPENDENT_AMBULATORY_CARE_PROVIDER_SITE_OTHER): Payer: Medicaid Other | Admitting: Obstetrics and Gynecology

## 2019-08-16 VITALS — BP 100/80 | Ht 66.0 in | Wt 254.0 lb

## 2019-08-16 DIAGNOSIS — Z30431 Encounter for routine checking of intrauterine contraceptive device: Secondary | ICD-10-CM

## 2019-08-16 DIAGNOSIS — Z01419 Encounter for gynecological examination (general) (routine) without abnormal findings: Secondary | ICD-10-CM

## 2019-08-16 DIAGNOSIS — A6000 Herpesviral infection of urogenital system, unspecified: Secondary | ICD-10-CM

## 2019-08-16 DIAGNOSIS — Z23 Encounter for immunization: Secondary | ICD-10-CM | POA: Diagnosis not present

## 2019-08-16 DIAGNOSIS — N83201 Unspecified ovarian cyst, right side: Secondary | ICD-10-CM | POA: Insufficient documentation

## 2019-08-16 DIAGNOSIS — Z0000 Encounter for general adult medical examination without abnormal findings: Secondary | ICD-10-CM | POA: Diagnosis not present

## 2019-08-16 DIAGNOSIS — R102 Pelvic and perineal pain: Secondary | ICD-10-CM

## 2019-08-16 MED ORDER — VALACYCLOVIR HCL 500 MG PO TABS: 500.0000 mg | ORAL_TABLET | Freq: Two times a day (BID) | ORAL | 1 refills | Status: DC | PRN

## 2019-08-16 NOTE — Patient Instructions (Signed)
I value your feedback and entrusting us with your care. If you get a [facility] patient survey, I would appreciate you taking the time to let us know about your experience today. Thank you! 

## 2019-10-19 ENCOUNTER — Other Ambulatory Visit: Payer: Self-pay

## 2019-10-19 DIAGNOSIS — Z20822 Contact with and (suspected) exposure to covid-19: Secondary | ICD-10-CM

## 2019-10-21 LAB — NOVEL CORONAVIRUS, NAA: SARS-CoV-2, NAA: NOT DETECTED

## 2019-10-25 ENCOUNTER — Ambulatory Visit: Payer: Medicaid Other

## 2019-10-25 DIAGNOSIS — N83201 Unspecified ovarian cyst, right side: Secondary | ICD-10-CM

## 2019-12-11 ENCOUNTER — Emergency Department
Admission: EM | Admit: 2019-12-11 | Discharge: 2019-12-11 | Disposition: A | Discharge status: Home / Self Care | Payer: Medicaid Other | Attending: Emergency Medicine | Admitting: Emergency Medicine

## 2019-12-11 ENCOUNTER — Emergency Department: Payer: Medicaid Other

## 2019-12-11 ENCOUNTER — Other Ambulatory Visit: Payer: Self-pay

## 2019-12-11 ENCOUNTER — Encounter: Payer: Self-pay | Admitting: Emergency Medicine

## 2019-12-11 DIAGNOSIS — X501XXA Overexertion from prolonged static or awkward postures, initial encounter: Secondary | ICD-10-CM | POA: Insufficient documentation

## 2019-12-11 DIAGNOSIS — M545 Low back pain, unspecified: Secondary | ICD-10-CM

## 2019-12-11 DIAGNOSIS — Y999 Unspecified external cause status: Secondary | ICD-10-CM | POA: Diagnosis not present

## 2019-12-11 DIAGNOSIS — Y929 Unspecified place or not applicable: Secondary | ICD-10-CM | POA: Insufficient documentation

## 2019-12-11 DIAGNOSIS — W11XXXA Fall on and from ladder, initial encounter: Secondary | ICD-10-CM | POA: Insufficient documentation

## 2019-12-11 DIAGNOSIS — Z87891 Personal history of nicotine dependence: Secondary | ICD-10-CM | POA: Diagnosis not present

## 2019-12-11 DIAGNOSIS — W19XXXA Unspecified fall, initial encounter: Secondary | ICD-10-CM

## 2019-12-11 DIAGNOSIS — Y93E9 Activity, other interior property and clothing maintenance: Secondary | ICD-10-CM | POA: Insufficient documentation

## 2019-12-11 DIAGNOSIS — M25531 Pain in right wrist: Secondary | ICD-10-CM | POA: Diagnosis not present

## 2019-12-11 DIAGNOSIS — Z79899 Other long term (current) drug therapy: Secondary | ICD-10-CM | POA: Insufficient documentation

## 2019-12-11 LAB — POCT PREGNANCY, URINE: Preg Test, Ur: NEGATIVE

## 2019-12-11 MED ORDER — BACLOFEN 5 MG PO TABS: 5.0000 mg | ORAL_TABLET | Freq: Three times a day (TID) | ORAL | 0 refills | Status: DC | PRN

## 2019-12-11 MED ORDER — IBUPROFEN 600 MG PO TABS: 600.0000 mg | ORAL_TABLET | Freq: Four times a day (QID) | ORAL | 0 refills | Status: DC | PRN

## 2019-12-11 MED ORDER — TRAMADOL HCL 50 MG PO TABS: 50.0000 mg | ORAL_TABLET | Freq: Four times a day (QID) | ORAL | 0 refills | Status: DC | PRN

## 2019-12-11 MED ORDER — OXYCODONE-ACETAMINOPHEN 5-325 MG PO TABS
1.0000 | ORAL_TABLET | Freq: Once | ORAL | Status: AC
  Administered 2019-12-11: 17:00:00 1 via ORAL
  Filled 2019-12-11: qty 1

## 2019-12-11 MED ORDER — LIDOCAINE 5 % EX PTCH: 1.0000 | MEDICATED_PATCH | CUTANEOUS | 0 refills | Status: DC

## 2019-12-11 NOTE — ED Provider Notes (Signed)
[facility] Emergency Department Provider Note  ____________________________________________  Time seen: Approximately 4:53 PM  I have reviewed the triage vital signs and the nursing notes.   HISTORY  Chief Complaint Fall    HPI Lisa House is a 29 y.o. female that presents to the emergency department for evaluation of back pain and right wrist pain following a fall this morning.  Patient states that she was painting at 6 AM and was reaching to get tape when she felt a pop to her mid back.  Pain does not radiate.  Patient states that she was trying to then get off of her step ladder and lost her footing toward the bottom and hit her wrist on the end table.  She did not hit her head or lose consciousness.  No additional injuries.  No bowel or bladder dysfunction or saddle anesthesias.  No numbness, tingling, weakness to upper or lower extremities.  No shortness of breath, chest pain, abdominal pain.  Past Medical History:  Diagnosis Date  . Allergy   . Depression   . Eczema   . GERD (gastroesophageal reflux disease)   . Intertrigo   . Obesity   . OCD (obsessive compulsive disorder)   . Panic attack     Patient Active Problem List   Diagnosis Date Noted  . Right ovarian cyst 08/16/2019  . Family history of congenital aortic stenosis 09/23/2017  . Herpes genitalis 02/08/2017  . IBS (irritable bowel syndrome) 05/09/2015  . Morbid obesity (HCC) 05/09/2015  . OCD (obsessive compulsive disorder) 05/09/2015  . Bilateral polycystic ovarian syndrome 05/09/2015  . Psoriasis 05/09/2015  . History of depression 05/09/2015  . Allergic rhinitis 05/02/2010    Past Surgical History:  Procedure Laterality Date  . CHOLECYSTECTOMY  01/09/2011   Dr. Excell Seltzer    Prior to Admission medications   Medication Sig Start Date End Date Taking? Authorizing Provider  Baclofen 5 MG TABS Take 5 mg by mouth 3 (three) times daily as needed. 12/11/19   Enid Derry, PA-C   ibuprofen (ADVIL) 600 MG tablet Take 1 tablet (600 mg total) by mouth every 6 (six) hours as needed. 12/11/19   Enid Derry, PA-C  levonorgestrel (MIRENA, 52 MG,) 20 MCG/24HR IUD 1 Intra Uterine Device (1 each total) by Intrauterine route once for 1 dose. 06/15/18 06/15/18  [name], MD  lidocaine (LIDODERM) 5 % Place 1 patch onto the skin daily. Remove & Discard patch within 12 hours or as directed by MD 12/11/19   Enid Derry, PA-C  pimecrolimus (ELIDEL) 1 % cream APPLY EXTERNALLY TO THE AFFECTED AREA TWICE DAILY 05/12/19   [name], MD  traMADol (ULTRAM) 50 MG tablet Take 1 tablet (50 mg total) by mouth every 6 (six) hours as needed. 12/11/19 12/10/20  Enid Derry, PA-C  triamcinolone cream (KENALOG) 0.1 % Apply 1 application topically 2 (two) times daily. 05/12/19   [name], MD  valACYclovir (VALTREX) 500 MG tablet Take 1 tablet (500 mg total) by mouth 2 (two) times daily as needed. Prn outbreak x 3 days 08/16/19   Copland, Ilona Sorrel, PA-C    Allergies Patient has no known allergies.  Family History  Problem Relation Age of Onset  . Hypertension Mother   . Diabetes Mother   . Headache Mother   . Hypertension Father   . Lung cancer Father   . Asthma Father   . Heart disease Son        Aortic Valve Disorder Stenosis-had balloon valvuloplasty, on meds  .  Ovarian cancer Maternal Grandmother   . Colon cancer Maternal Grandfather     Social History Social History   Tobacco Use  . Smoking status: Former Research scientist (life sciences)  . Smokeless tobacco: Never Used  Substance Use Topics  . Alcohol use: No    Alcohol/week: 0.0 standard drinks  . Drug use: No     Review of Systems  Cardiovascular: No chest pain. Respiratory: No cough. No SOB. Gastrointestinal: No abdominal pain.  No nausea, no vomiting.  Musculoskeletal: Positive for back and wrist pain. Skin: Negative for rash, abrasions, lacerations, ecchymosis. Neurological: Negative for headaches, numbness or  tingling   ____________________________________________   PHYSICAL EXAM:  VITAL SIGNS: ED Triage Vitals  Enc Vitals Group     BP 12/11/19 1612 123/67     Pulse Rate 12/11/19 1612 (!) 105     Resp 12/11/19 1612 16     Temp 12/11/19 1612 98.8 F (37.1 C)     Temp Source 12/11/19 1612 Oral     SpO2 12/11/19 1612 100 %     Weight 12/11/19 1611 241 lb (109.3 kg)     Height 12/11/19 1611 5\' 5"  (1.651 m)     Head Circumference --      Peak Flow --      Pain Score 12/11/19 1610 7     Pain Loc --      Pain Edu? --      Excl. in [place]? --      Constitutional: Alert and oriented. Well appearing and in no acute distress. Eyes: Conjunctivae are normal. PERRL. EOMI. Head: Atraumatic. ENT:      Ears:      Nose: No congestion/rhinnorhea.      Mouth/Throat: Mucous membranes are moist.  Neck: No stridor.  No cervical spine tenderness to palpation. Cardiovascular: Normal rate, regular rhythm.  Good peripheral circulation. Respiratory: Normal respiratory effort without tachypnea or retractions. Lungs CTAB. Good air entry to the bases with no decreased or absent breath sounds. Gastrointestinal: Bowel sounds 4 quadrants. Soft and nontender to palpation. No guarding or rigidity. No palpable masses. No distention. Musculoskeletal: Full range of motion to all extremities. No gross deformities appreciated.  Tenderness to palpation to inferior thoracic spine and superior lumbar spine.  Tenderness to palpation to dorsal wrist.  No swelling or ecchymosis.  Strength equal in upper and lower extremities bilaterally. Ambulatory to bathroom with steady gait and without assist. Neurologic:  Normal speech and language. No gross focal neurologic deficits are appreciated.  Skin:  Skin is warm, dry and intact. No rash noted. Psychiatric: Mood and affect are normal. Speech and behavior are normal. Patient exhibits appropriate insight and judgement.   ____________________________________________   LABS (all  labs ordered are listed, but only abnormal results are displayed)  Labs Reviewed  POC URINE PREG, ED  POCT PREGNANCY, URINE   ____________________________________________  EKG   ____________________________________________  RADIOLOGY Robinette Haines, personally viewed and evaluated these images (plain radiographs) as part of my medical decision making, as well as reviewing the written report by the radiologist.  DG Thoracic Spine 2 View  Result Date: 12/11/2019 CLINICAL DATA:  Back pain. EXAM: THORACIC SPINE 2 VIEWS COMPARISON:  None. FINDINGS: There is no acute displaced fracture. No dislocation. There are no significant degenerative changes of the thoracic spine. IMPRESSION: No acute osseous abnormality. Electronically Signed   By: Constance Holster M.D.   On: 12/11/2019 19:05   DG Lumbar Spine 2-3 Views  Result Date: 12/11/2019 CLINICAL DATA:  Back pain.  EXAM: LUMBAR SPINE - 2-3 VIEW COMPARISON:  None. FINDINGS: There is no acute displaced fracture. No dislocation. Mild multilevel degenerative changes are noted throughout the lumbar spine. There is some facet arthrosis at the lower lumbar segments. IMPRESSION: No acute osseous abnormality. Mild multilevel degenerative changes are noted. Electronically Signed   By: Katherine Mantle M.D.   On: 12/11/2019 18:58   DG Wrist Complete Right  Result Date: 12/11/2019 CLINICAL DATA:  Pain. EXAM: RIGHT WRIST - COMPLETE 3+ VIEW COMPARISON:  None. FINDINGS: There is no evidence of fracture or dislocation. There is no evidence of arthropathy or other focal bone abnormality. Soft tissues are unremarkable. IMPRESSION: Negative. Electronically Signed   By: Katherine Mantle M.D.   On: 12/11/2019 19:10    ____________________________________________    PROCEDURES  Procedure(s) performed:    Procedures    Medications  oxyCODONE-acetaminophen (PERCOCET/ROXICET) 5-325 MG per tablet 1 tablet (1 tablet Oral Given 12/11/19 1703)      ____________________________________________   INITIAL IMPRESSION / ASSESSMENT AND PLAN / ED COURSE  Pertinent labs & imaging results that were available during my care of the patient were reviewed by me and considered in my medical decision making (see chart for details).  Review of the [place] CSRS was performed in accordance of the [facility] prior to dispensing any controlled drugs.   Patient presented to emergency department for evaluation after fall.  Vital signs and exam are reassuring.  X-rays are negative for acute bony abnormalities.  Velcro wrist splint was placed.  Patient will be discharged home with prescriptions for tramadol, motrin, and baclofen. Patient is to follow up with primary care as directed. Patient is given ED precautions to return to the ED for any worsening or new symptoms.   LAQUONDA WELBY was evaluated in Emergency Department on 12/11/2019 for the symptoms described in the history of present illness. She was evaluated in the context of the global COVID-19 pandemic, which necessitated consideration that the patient might be at risk for infection with the SARS-CoV-2 virus that causes COVID-19. Institutional protocols and algorithms that pertain to the evaluation of patients at risk for COVID-19 are in a state of rapid change based on information released by regulatory bodies including the CDC and federal and state organizations. These policies and algorithms were followed during the patient's care in the ED.  ____________________________________________  FINAL CLINICAL IMPRESSION(S) / ED DIAGNOSES  Final diagnoses:  Fall, initial encounter  Acute midline low back pain without sciatica  Right wrist pain      NEW MEDICATIONS STARTED DURING THIS VISIT:  ED Discharge Orders         Ordered    traMADol (ULTRAM) 50 MG tablet  Every 6 hours PRN     12/11/19 1931    Baclofen 5 MG TABS  3 times daily PRN     12/11/19 1931    ibuprofen (ADVIL) 600 MG tablet  Every 6  hours PRN     12/11/19 1931    lidocaine (LIDODERM) 5 %  Every 24 hours     12/11/19 1931              This chart was dictated using voice recognition software/Dragon. Despite best efforts to proofread, errors can occur which can change the meaning. Any change was purely unintentional.    Enid Derry, PA-C 12/11/19 2300    Jene Every, MD 12/13/19 [phone]

## 2019-12-11 NOTE — ED Triage Notes (Signed)
Pt to ED via POV c/o fall this morning. Pt states that she is having pain in her back and her right wrist. Pt is in NAD at this time.

## 2019-12-13 ENCOUNTER — Other Ambulatory Visit: Payer: Self-pay

## 2019-12-13 ENCOUNTER — Encounter: Payer: Self-pay | Admitting: Family Medicine

## 2019-12-13 ENCOUNTER — Other Ambulatory Visit: Payer: Self-pay | Admitting: Family Medicine

## 2019-12-13 ENCOUNTER — Ambulatory Visit: Payer: Medicaid Other | Admitting: Family Medicine

## 2019-12-13 ENCOUNTER — Ambulatory Visit (INDEPENDENT_AMBULATORY_CARE_PROVIDER_SITE_OTHER): Payer: Medicaid Other | Admitting: Family Medicine

## 2019-12-13 DIAGNOSIS — S29012A Strain of muscle and tendon of back wall of thorax, initial encounter: Secondary | ICD-10-CM | POA: Diagnosis not present

## 2019-12-13 DIAGNOSIS — S39012D Strain of muscle, fascia and tendon of lower back, subsequent encounter: Secondary | ICD-10-CM

## 2019-12-13 DIAGNOSIS — M25531 Pain in right wrist: Secondary | ICD-10-CM

## 2019-12-13 DIAGNOSIS — W19XXXD Unspecified fall, subsequent encounter: Secondary | ICD-10-CM | POA: Diagnosis not present

## 2019-12-13 MED ORDER — MELOXICAM 15 MG PO TABS: 15.0000 mg | ORAL_TABLET | Freq: Every day | ORAL | 0 refills | Status: DC

## 2019-12-13 MED ORDER — TIZANIDINE HCL 4 MG PO TABS: 2.0000 mg | ORAL_TABLET | Freq: Three times a day (TID) | ORAL | 0 refills | Status: DC | PRN

## 2019-12-13 NOTE — Patient Instructions (Signed)

## 2019-12-13 NOTE — Progress Notes (Signed)
Name: Lisa House   MRN: [id number]    DOB: Oct 04, 1991   Date:12/13/2019       Progress Note  Subjective  Chief Complaint  Chief Complaint  Patient presents with  . Fall  . Back Pain    now constipated seen in ER  . Joint Swelling    right wrist    I connected with  [name]  on 12/13/19 at  9:40 AM EST by a video enabled telemedicine application and verified that I am speaking with the correct person using two identifiers.  I discussed the limitations of evaluation and management by telemedicine and the availability of in person appointments. The patient expressed understanding and agreed to proceed. Staff also discussed with the patient that there may be a patient responsible charge related to this service. Patient Location: Home Provider Location: Office Additional Individuals present: None  HPI  Pt presents to follow up on ER visit for fall from a ladder.  She was seen in the ER 12/11/19 for this - had Xray of T-spine, L-spine, and R wrist - all were negative for acute changes or fracture; L-spine did show mild degenerative changes.  She is still having low and mid back pain and right wrist pain - she notes she is sore.  She notes a history of chronic back pain, that has been exacerbated from this fall. Does note some radiation into the right lower extremity intermittently. RIGHT wrist is bruised and painful as well.  She was given tramadol and does not like the way it makes her feel; she was also given baclofen, ibuprofen, and lidocaine patches - baclofen is helping some at night; the lidocaine patches help a little bit (for about 30 minutes).  She does not do well on prednisone.  She has been applying heat, doing gentle stretches.  Denies weakness, numbness/tingling, loss of bowel/bladder control.  Patient Active Problem List   Diagnosis Date Noted  . Right ovarian cyst 08/16/2019  . Family history of congenital aortic stenosis 09/23/2017  . Herpes genitalis 02/08/2017   . IBS (irritable bowel syndrome) 05/09/2015  . Morbid obesity ([place]) 05/09/2015  . OCD (obsessive compulsive disorder) 05/09/2015  . Bilateral polycystic ovarian syndrome 05/09/2015  . Psoriasis 05/09/2015  . History of depression 05/09/2015  . Allergic rhinitis 05/02/2010    Social History   Tobacco Use  . Smoking status: Former Research scientist (life sciences)  . Smokeless tobacco: Never Used  Substance Use Topics  . Alcohol use: No    Alcohol/week: 0.0 standard drinks     Current Outpatient Medications:  .  Baclofen 5 MG TABS, Take 5 mg by mouth 3 (three) times daily as needed., Disp: 15 tablet, Rfl: 0 .  ibuprofen (ADVIL) 600 MG tablet, Take 1 tablet (600 mg total) by mouth every 6 (six) hours as needed., Disp: 30 tablet, Rfl: 0 .  lidocaine (LIDODERM) 5 %, Place 1 patch onto the skin daily. Remove & Discard patch within 12 hours or as directed by MD, Disp: 30 patch, Rfl: 0 .  pimecrolimus (ELIDEL) 1 % cream, APPLY EXTERNALLY TO THE AFFECTED AREA TWICE DAILY, Disp: 100 g, Rfl: 0 .  traMADol (ULTRAM) 50 MG tablet, Take 1 tablet (50 mg total) by mouth every 6 (six) hours as needed., Disp: 8 tablet, Rfl: 0 .  triamcinolone cream (KENALOG) 0.1 %, Apply 1 application topically 2 (two) times daily., Disp: 453.6 g, Rfl: 0 .  valACYclovir (VALTREX) 500 MG tablet, Take 1 tablet (500 mg total) by mouth  2 (two) times daily as needed. Prn outbreak x 3 days, Disp: 30 tablet, Rfl: 1 .  levonorgestrel (MIRENA, 52 MG,) 20 MCG/24HR IUD, 1 Intra Uterine Device (1 each total) by Intrauterine route once for 1 dose., Disp: 1 each, Rfl: 0  No Known Allergies  I personally reviewed active problem list, medication list, allergies, notes from last encounter, lab results with the patient/caregiver today.  ROS  Ten systems reviewed and is negative except as mentioned in HPI    Objective  Virtual encounter, vitals not obtained.  There is no height or weight on file to calculate BMI.  Nursing Note and Vital Signs  reviewed.  Physical Exam  Pulmonary/Chest: Effort normal. No respiratory distress. Speaking in complete sentences Neurological: Pt is alert and oriented to person, place, and time. Coordination, speech are normal.  Psychiatric: Patient has a normal mood and affect. behavior is normal. Judgment and thought content normal. MSK: RIGHT wrist does have ecchymosis present along with non-pitting edema.  Results for orders placed or performed during the hospital encounter of 12/11/19 (from the past 72 hour(s))  Pregnancy, urine POC     Status: None   Collection Time: 12/11/19  6:08 PM  Result Value Ref Range   Preg Test, Ur NEGATIVE NEGATIVE    Comment:        THE SENSITIVITY OF THIS METHODOLOGY IS >24 mIU/mL     Assessment & Plan  1. Strain of lumbar region, subsequent encounter - tiZANidine (ZANAFLEX) 4 MG tablet; Take 0.5-1 tablets (2-4 mg total) by mouth every 8 (eight) hours as needed for muscle spasms.  Dispense: 30 tablet; Refill: 0 - meloxicam (MOBIC) 15 MG tablet; Take 1 tablet (15 mg total) by mouth daily. Take with food.  Dispense: 30 tablet; Refill: 0  2. Strain of thoracic back region - tiZANidine (ZANAFLEX) 4 MG tablet; Take 0.5-1 tablets (2-4 mg total) by mouth every 8 (eight) hours as needed for muscle spasms.  Dispense: 30 tablet; Refill: 0 - meloxicam (MOBIC) 15 MG tablet; Take 1 tablet (15 mg total) by mouth daily. Take with food.  Dispense: 30 tablet; Refill: 0  3. Acute pain of right wrist  - tiZANidine (ZANAFLEX) 4 MG tablet; Take 0.5-1 tablets (2-4 mg total) by mouth every 8 (eight) hours as needed for muscle spasms.  Dispense: 30 tablet; Refill: 0 - meloxicam (MOBIC) 15 MG tablet; Take 1 tablet (15 mg total) by mouth daily. Take with food.  Dispense: 30 tablet; Refill: 0  4. Fall, subsequent encounter - Advised will likely take several days, up to 2 weeks for pain to subside, Xrays were reassuring.  Discussed gentle stretching now, but once tolerable, may work on  core and back exercises.   - tiZANidine (ZANAFLEX) 4 MG tablet; Take 0.5-1 tablets (2-4 mg total) by mouth every 8 (eight) hours as needed for muscle spasms.  Dispense: 30 tablet; Refill: 0 - meloxicam (MOBIC) 15 MG tablet; Take 1 tablet (15 mg total) by mouth daily. Take with food.  Dispense: 30 tablet; Refill: 0   -Red flags and when to present for emergency care or RTC including fever >101.50F, chest pain, shortness of breath, new/worsening/un-resolving symptoms, numbness/tingling/weakness, loss of bowel/bladder control reviewed with patient at time of visit. Follow up and care instructions discussed and provided in AVS. - I discussed the assessment and treatment plan with the patient. The patient was provided an opportunity to ask questions and all were answered. The patient agreed with the plan and demonstrated an understanding of the  instructions.  I provided 13 minutes of non-face-to-face time during this encounter.  Doren Custard, FNP

## 2019-12-15 ENCOUNTER — Ambulatory Visit: Payer: Self-pay | Admitting: *Deleted

## 2019-12-15 NOTE — Telephone Encounter (Signed)
Called pt and offered her an appt with Dorris Fetch for Fri 12-16-2019, but want to call us back if she thought she could do this. Also told her of a few for the following week.

## 2019-12-15 NOTE — Telephone Encounter (Signed)
Per initial encounter, "Pt called in and stated that she as just a little section of skin on the top of her forearm that is tingly . No other symptoms , this is only in the lower part of her forream. It state last night and she stated she has never had this happen before"; contacted the pt and she states her skin has "2 tiny bruises on top of skin that do not hurt; they are light blue"; ; the pt says it feels like her arm has fallen asleep and feels like it is trying to wake it; she rates her discomfort and 1 out of 10; she has not tried any treatment; she does not remember inuring her arm; the pt says that she is able to use her hand and arm normally; she does report that her left arm arm is more sensitive to change in temperature which she noticed on 12/14/19;   this area does not look like her tyipcal psoriasis or eczema; the affected area is the size of a dime, the numbness is from her left wrist to the bend of her arm; the pt says  the pt says that she took Tramadol prescribed in ED on 12/11/19; her last dose was 2//2/21; recommendations made per nurse triage protocol; she verbalized understanding; the pt says she is willing to be seen by Dr Carlynn Purl or Maurice Small if needed; she can be contacted at  [phone]; will route to office for notification.  Reason for Disposition . 1 or 2 small sores  Answer Assessment - Initial Assessment Questions 1. SYMPTOM: "What is the main symptom you are concerned about?" (e.g., weakness, numbness)     Numbness/tingling in left arm when she touches her arm 2. ONSET: "When did this start?" (minutes, hours, days; while sleeping)     12/14/19 around 2100 3. LAST NORMAL: "When was the last time you were normal (no symptoms)?"     12/14/19 around 2100 4. PATTERN "Does this come and go, or has it been constant since it started?"  "Is it present now?"     constant 5. CARDIAC SYMPTOMS: "Have you had any of the following symptoms: chest pain, difficulty breathing,  palpitations?"     no 6. NEUROLOGIC SYMPTOMS: "Have you had any of the following symptoms: headache, dizziness, vision loss, double vision, changes in speech, unsteady on your feet?"     no 7. OTHER SYMPTOMS: "Do you have any other symptoms?"     dime sized area that is light blue 8. PREGNANCY: "Is there any chance you are pregnant?" "When was your last menstrual period?"   No IUD  Answer Assessment - Initial Assessment Questions 1. APPEARANCE of SORES: "What do the sores look like?"     Light blue 2. NUMBER: "How many sores are there?"     2 3. SIZE: "How big is the largest sore?"     Dime sized area 4. LOCATION: "Where are the sores located?"     Left forearm 5. ONSET: "When did the sores begin?"     12/14/19 6. CAUSE: "What do you think is causing the sores?"     Not sure 7. OTHER SYMPTOMS: "Do you have any other symptoms?" (e.g., fever, new weakness)     Tingling; sensitive to changes in temperature  Protocols used: SORES-A-AH, NEUROLOGIC DEFICIT-A-AH

## 2019-12-16 ENCOUNTER — Ambulatory Visit: Payer: Medicaid Other | Admitting: Family Medicine

## 2019-12-22 ENCOUNTER — Emergency Department
Admission: EM | Admit: 2019-12-22 | Discharge: 2019-12-22 | Disposition: A | Discharge status: Home / Self Care | Payer: Medicaid Other | Attending: Emergency Medicine | Admitting: Emergency Medicine

## 2019-12-22 ENCOUNTER — Emergency Department: Payer: Medicaid Other

## 2019-12-22 ENCOUNTER — Encounter: Payer: Self-pay | Admitting: Emergency Medicine

## 2019-12-22 DIAGNOSIS — Y92000 Kitchen of unspecified non-institutional (private) residence as  the place of occurrence of the external cause: Secondary | ICD-10-CM | POA: Diagnosis not present

## 2019-12-22 DIAGNOSIS — Z79899 Other long term (current) drug therapy: Secondary | ICD-10-CM | POA: Diagnosis not present

## 2019-12-22 DIAGNOSIS — Y93E5 Activity, floor mopping and cleaning: Secondary | ICD-10-CM | POA: Insufficient documentation

## 2019-12-22 DIAGNOSIS — Y999 Unspecified external cause status: Secondary | ICD-10-CM | POA: Diagnosis not present

## 2019-12-22 DIAGNOSIS — W01198A Fall on same level from slipping, tripping and stumbling with subsequent striking against other object, initial encounter: Secondary | ICD-10-CM | POA: Insufficient documentation

## 2019-12-22 DIAGNOSIS — S99921A Unspecified injury of right foot, initial encounter: Secondary | ICD-10-CM | POA: Diagnosis not present

## 2019-12-22 DIAGNOSIS — Z87891 Personal history of nicotine dependence: Secondary | ICD-10-CM | POA: Diagnosis not present

## 2019-12-22 MED ORDER — OXYCODONE-ACETAMINOPHEN 5-325 MG PO TABS: 1.0000 | ORAL_TABLET | Freq: Once | ORAL | Status: DC

## 2019-12-22 MED ORDER — ACETAMINOPHEN 500 MG PO TABS: 500.0000 mg | ORAL_TABLET | Freq: Four times a day (QID) | ORAL | 0 refills | Status: DC | PRN

## 2019-12-22 MED ORDER — KETOROLAC TROMETHAMINE 30 MG/ML IJ SOLN
30.0000 mg | Freq: Once | INTRAMUSCULAR | Status: AC
  Administered 2019-12-22: 30 mg via INTRAMUSCULAR
  Filled 2019-12-22: qty 1

## 2019-12-22 NOTE — ED Triage Notes (Signed)
Pt had wet shoes on in kitchen tonight when pt slide into wall with right foot. Since pt has had worsening pain in rt foot. Swelling noted but no obvious deformity.

## 2019-12-22 NOTE — ED Provider Notes (Signed)
[facility] Emergency Department Provider Note  ____________________________________________  Time seen: Approximately 10:28 PM  I have reviewed the triage vital signs and the nursing notes.   HISTORY  Chief Complaint Foot Injury    HPI Lisa House is a 29 y.o. female that presents to the emergency department for evaluation of right foot pain after injury tonight. She was mopping her kitchen, when her foot slipped. The outside of her right foot hit the baseboard.  Pain has been intermittent since injury.  She is having pain to the bottom of her mid foot currently. She is unable to bear full weight. I saw her in the ED 2 weeks ago for a different fall and those injuries have healed nicely. No numbness, tingling.   Past Medical History:  Diagnosis Date  . Allergy   . Depression   . Eczema   . GERD (gastroesophageal reflux disease)   . Intertrigo   . Obesity   . OCD (obsessive compulsive disorder)   . Panic attack     Patient Active Problem List   Diagnosis Date Noted  . Right ovarian cyst 08/16/2019  . Family history of congenital aortic stenosis 09/23/2017  . Herpes genitalis 02/08/2017  . IBS (irritable bowel syndrome) 05/09/2015  . Morbid obesity ([facility]) 05/09/2015  . OCD (obsessive compulsive disorder) 05/09/2015  . Bilateral polycystic ovarian syndrome 05/09/2015  . Psoriasis 05/09/2015  . History of depression 05/09/2015  . Allergic rhinitis 05/02/2010    Past Surgical History:  Procedure Laterality Date  . CHOLECYSTECTOMY  01/09/2011   Dr. Excell Seltzer    Prior to Admission medications   Medication Sig Start Date End Date Taking? Authorizing Provider  acetaminophen (TYLENOL) 500 MG tablet Take 1 tablet (500 mg total) by mouth every 6 (six) hours as needed. 12/22/19   Enid Derry, PA-C  levonorgestrel (MIRENA, 52 MG,) 20 MCG/24HR IUD 1 Intra Uterine Device (1 each total) by Intrauterine route once for 1 dose. 06/15/18 06/15/18  [name], MD  lidocaine (LIDODERM) 5 % Place 1 patch onto the skin daily. Remove & Discard patch within 12 hours or as directed by MD 12/11/19   Enid Derry, PA-C  meloxicam (MOBIC) 15 MG tablet Take 1 tablet (15 mg total) by mouth daily. Take with food. 12/13/19   Doren Custard, FNP  pimecrolimus (ELIDEL) 1 % cream APPLY EXTERNALLY TO THE AFFECTED AREA TWICE DAILY 05/12/19   Carlynn Purl, Danna Hefty, MD  tiZANidine (ZANAFLEX) 4 MG tablet Take 0.5-1 tablets (2-4 mg total) by mouth every 8 (eight) hours as needed for muscle spasms. 12/13/19   Doren Custard, FNP  triamcinolone cream (KENALOG) 0.1 % Apply 1 application topically 2 (two) times daily. 05/12/19   [name], MD  valACYclovir (VALTREX) 500 MG tablet Take 1 tablet (500 mg total) by mouth 2 (two) times daily as needed. Prn outbreak x 3 days 08/16/19   Copland, Ilona Sorrel, PA-C    Allergies Patient has no known allergies.  Family History  Problem Relation Age of Onset  . Hypertension Mother   . Diabetes Mother   . Headache Mother   . Hypertension Father   . Lung cancer Father   . Asthma Father   . Heart disease Son        Aortic Valve Disorder Stenosis-had balloon valvuloplasty, on meds  . Ovarian cancer Maternal Grandmother   . Colon cancer Maternal Grandfather     Social History Social History   Tobacco Use  . Smoking status: Former Games developer  .  Smokeless tobacco: Never Used  Substance Use Topics  . Alcohol use: No    Alcohol/week: 0.0 standard drinks  . Drug use: No     Review of Systems  Respiratory:  No SOB. Gastrointestinal: No abdominal pain.  No nausea, no vomiting.  Musculoskeletal: Positive for foot pain. Skin: Negative for rash, abrasions, lacerations, ecchymosis. Neurological: Negative for headaches, numbness or tingling   ____________________________________________   PHYSICAL EXAM:  VITAL SIGNS: ED Triage Vitals [12/22/19 2148]  Enc Vitals Group     BP 121/74     Pulse Rate 94     Resp 16     Temp 99 F  (37.2 C)     Temp Source Oral     SpO2 100 %     Weight      Height      Head Circumference      Peak Flow      Pain Score 9     Pain Loc      Pain Edu?      Excl. in GC?      Constitutional: Alert and oriented. Well appearing and in no acute distress. Eyes: Conjunctivae are normal. PERRL. EOMI. Head: Atraumatic. ENT:      Ears:      Nose: No congestion/rhinnorhea.      Mouth/Throat: Mucous membranes are moist.  Neck: No stridor. Cardiovascular: Normal rate, regular rhythm.  Good peripheral circulation.  Symmetric pedal pulses bilaterally. Respiratory: Normal respiratory effort without tachypnea or retractions. Lungs CTAB. Good air entry to the bases with no decreased or absent breath sounds. Musculoskeletal: Full range of motion to all extremities. No gross deformities appreciated. No tenderness over ankle.full range of motion of ankle without pain.  Tenderness to palpation to mid bottom foot. Minimal swelling. No ecchymosis. Neurologic:  Normal speech and language. No gross focal neurologic deficits are appreciated.  Skin:  Skin is warm, dry and intact. No rash noted. No ecchymosis. Psychiatric: Mood and affect are normal. Speech and behavior are normal. Patient exhibits appropriate insight and judgement.   ____________________________________________   LABS (all labs ordered are listed, but only abnormal results are displayed)  Labs Reviewed - No data to display ____________________________________________  EKG   ____________________________________________  RADIOLOGY Lexine Baton, personally viewed and evaluated these images (plain radiographs) as part of my medical decision making, as well as reviewing the written report by the radiologist.  DG Foot Complete Right  Result Date: 12/22/2019 CLINICAL DATA:  Right foot injury, swelling EXAM: RIGHT FOOT COMPLETE - 3+ VIEW COMPARISON:  11/25/2012 FINDINGS: Frontal, oblique, and lateral views of the right foot are  obtained. There is a prior healed fifth metatarsal fracture. No acute bony abnormality. Joint spaces are well preserved. Mild soft tissue swelling throughout the forefoot. IMPRESSION: 1. No acute displaced fracture. 2. Soft tissue swelling of the forefoot. Electronically Signed   By: Sharlet Salina M.D.   On: 12/22/2019 22:41    ____________________________________________    PROCEDURES  Procedure(s) performed:    Procedures    Medications  ketorolac (TORADOL) 30 MG/ML injection 30 mg (30 mg Intramuscular Given 12/22/19 2322)     ____________________________________________   INITIAL IMPRESSION / ASSESSMENT AND PLAN / ED COURSE  Pertinent labs & imaging results that were available during my care of the patient were reviewed by me and considered in my medical decision making (see chart for details).  Review of the [place] CSRS was performed in accordance of the [facility] prior to dispensing any controlled drugs.  Patient presented to the emergency department for evaluation of foot injury.  Vital signs and exam are reassuring.  X-ray negative for acute bony abnormalities.  Foot was Ace wrapped.  Postop shoe was placed.  Crutches were given.  Patient was given Toradol for pain.  Patient will be discharged home with prescriptions for Tylenol.  Patient has already been placed on Mobic by primary care.  Patient is to follow up with primary care as directed. Patient is given ED precautions to return to the ED for any worsening or new symptoms.   Lisa House was evaluated in Emergency Department on 12/22/2019 for the symptoms described in the history of present illness. She was evaluated in the context of the global COVID-19 pandemic, which necessitated consideration that the patient might be at risk for infection with the SARS-CoV-2 virus that causes COVID-19. Institutional protocols and algorithms that pertain to the evaluation of patients at risk for COVID-19 are in a state of rapid change based  on information released by regulatory bodies including the CDC and federal and state organizations. These policies and algorithms were followed during the patient's care in the ED.  ____________________________________________  FINAL CLINICAL IMPRESSION(S) / ED DIAGNOSES  Final diagnoses:  Injury of right foot, initial encounter      NEW MEDICATIONS STARTED DURING THIS VISIT:  ED Discharge Orders         Ordered    acetaminophen (TYLENOL) 500 MG tablet  Every 6 hours PRN     12/22/19 2305              This chart was dictated using voice recognition software/Dragon. Despite best efforts to proofread, errors can occur which can change the meaning. Any change was purely unintentional.    Laban Emperor, PA-C 12/22/19 2357    Blake Divine, MD 12/23/19 Benancio Deeds

## 2019-12-22 NOTE — Discharge Instructions (Signed)
There is no fracture on your x-ray.  Please call primary care tomorrow for a follow-up next week. You can continue the mobic primary care gave you last week.

## 2019-12-22 NOTE — ED Notes (Signed)
Pt states she was walking and slid on a wet spot in her kitchen, her right ankle hit a wall. Minimal swelling noted.  Pt in NAD at this time.

## 2020-01-19 ENCOUNTER — Encounter: Payer: Self-pay | Admitting: Emergency Medicine

## 2020-01-19 ENCOUNTER — Emergency Department
Admission: EM | Admit: 2020-01-19 | Discharge: 2020-01-19 | Disposition: A | Discharge status: Home / Self Care | Payer: Medicaid Other | Attending: Emergency Medicine | Admitting: Emergency Medicine

## 2020-01-19 ENCOUNTER — Other Ambulatory Visit: Payer: Self-pay

## 2020-01-19 DIAGNOSIS — Z87891 Personal history of nicotine dependence: Secondary | ICD-10-CM | POA: Diagnosis not present

## 2020-01-19 DIAGNOSIS — M25512 Pain in left shoulder: Secondary | ICD-10-CM | POA: Insufficient documentation

## 2020-01-19 DIAGNOSIS — M546 Pain in thoracic spine: Secondary | ICD-10-CM | POA: Insufficient documentation

## 2020-01-19 DIAGNOSIS — M542 Cervicalgia: Secondary | ICD-10-CM | POA: Insufficient documentation

## 2020-01-19 DIAGNOSIS — M25511 Pain in right shoulder: Secondary | ICD-10-CM | POA: Diagnosis not present

## 2020-01-19 DIAGNOSIS — M549 Dorsalgia, unspecified: Secondary | ICD-10-CM | POA: Diagnosis present

## 2020-01-19 DIAGNOSIS — Z9049 Acquired absence of other specified parts of digestive tract: Secondary | ICD-10-CM | POA: Diagnosis not present

## 2020-01-19 DIAGNOSIS — Z79899 Other long term (current) drug therapy: Secondary | ICD-10-CM | POA: Diagnosis not present

## 2020-01-19 LAB — URINALYSIS, COMPLETE (UACMP) WITH MICROSCOPIC
Bilirubin Urine: NEGATIVE
Glucose, UA: NEGATIVE mg/dL
Ketones, ur: NEGATIVE mg/dL
Nitrite: NEGATIVE
Protein, ur: NEGATIVE mg/dL
Specific Gravity, Urine: 1.014 (ref 1.005–1.030)
pH: 7 (ref 5.0–8.0)

## 2020-01-19 MED ORDER — METHOCARBAMOL 500 MG PO TABS: ORAL_TABLET | ORAL | 0 refills | Status: DC

## 2020-01-19 MED ORDER — HYDROCODONE-ACETAMINOPHEN 5-325 MG PO TABS
1.0000 | ORAL_TABLET | Freq: Once | ORAL | Status: AC
  Administered 2020-01-19: 1 via ORAL
  Filled 2020-01-19: qty 1

## 2020-01-19 MED ORDER — HYDROCODONE-ACETAMINOPHEN 5-325 MG PO TABS: 1.0000 | ORAL_TABLET | Freq: Four times a day (QID) | ORAL | 0 refills | Status: DC | PRN

## 2020-01-19 MED ORDER — METHOCARBAMOL 500 MG PO TABS
1000.0000 mg | ORAL_TABLET | Freq: Once | ORAL | Status: AC
  Administered 2020-01-19: 1000 mg via ORAL
  Filled 2020-01-19: qty 2

## 2020-01-19 MED ORDER — KETOROLAC TROMETHAMINE 30 MG/ML IJ SOLN
30.0000 mg | Freq: Once | INTRAMUSCULAR | Status: DC
  Filled 2020-01-19: qty 1

## 2020-01-19 NOTE — Discharge Instructions (Addendum)
Follow-up with your primary care provider or call the orthopedic department at [facility] and make an appointment for your back.  Dr. Rosita Kea is on-call for orthopedics and his contact information is listed on your discharge papers.  Continue taking medication only as directed and you may also take naproxen if additional medication is needed.  You can continue with ice or heat to your muscles as needed for discomfort.

## 2020-01-19 NOTE — ED Provider Notes (Signed)
[facility] Emergency Department Provider Note  ____________________________________________   First MD Initiated Contact with Patient 01/19/20 1040     (approximate)  I have reviewed the triage vital signs and the nursing notes.   HISTORY  Chief Complaint Back Pain and Neck Pain   HPI Lisa House is a 29 y.o. female presents to the ED with complaint of mid back and shoulder pain bilaterally for the last 4 days.  Patient states that she has been having problems with her back for several weeks without any history of injury.  Patient has been seen in the ED in January for x-rays of her back and states that she knows she has degenerative changes.  She states that last evening she was unable to sleep due to the pain.  She denies any new symptoms and denies any urinary symptoms or history of kidney stones.  There has been no nausea, vomiting, fever, chills, incontinence of bowel or bladder.  She rates her pain as an 8 out of 10.     Past Medical History:  Diagnosis Date  . Allergy   . Depression   . Eczema   . GERD (gastroesophageal reflux disease)   . Intertrigo   . Obesity   . OCD (obsessive compulsive disorder)   . Panic attack     Patient Active Problem List   Diagnosis Date Noted  . Right ovarian cyst 08/16/2019  . Family history of congenital aortic stenosis 09/23/2017  . Herpes genitalis 02/08/2017  . IBS (irritable bowel syndrome) 05/09/2015  . Morbid obesity (HCC) 05/09/2015  . OCD (obsessive compulsive disorder) 05/09/2015  . Bilateral polycystic ovarian syndrome 05/09/2015  . Psoriasis 05/09/2015  . History of depression 05/09/2015  . Allergic rhinitis 05/02/2010    Past Surgical History:  Procedure Laterality Date  . CHOLECYSTECTOMY  01/09/2011   Dr. Excell Seltzer    Prior to Admission medications   Medication Sig Start Date End Date Taking? Authorizing Provider  acetaminophen (TYLENOL) 500 MG tablet Take 1 tablet (500 mg total) by  mouth every 6 (six) hours as needed. 12/22/19   Enid Derry, PA-C  HYDROcodone-acetaminophen (NORCO/VICODIN) 5-325 MG tablet Take 1 tablet by mouth every 6 (six) hours as needed for moderate pain. 01/19/20   Tommi Rumps, PA-C  levonorgestrel (MIRENA, 52 MG,) 20 MCG/24HR IUD 1 Intra Uterine Device (1 each total) by Intrauterine route once for 1 dose. 06/15/18 06/15/18  [name], MD  methocarbamol (ROBAXIN) 500 MG tablet 1-2 tablets every 6 hours prn muscle spasms 01/19/20   Bridget Hartshorn L, PA-C  pimecrolimus (ELIDEL) 1 % cream APPLY EXTERNALLY TO THE AFFECTED AREA TWICE DAILY 05/12/19   [name], MD  triamcinolone cream (KENALOG) 0.1 % Apply 1 application topically 2 (two) times daily. 05/12/19   [name], MD  valACYclovir (VALTREX) 500 MG tablet Take 1 tablet (500 mg total) by mouth 2 (two) times daily as needed. Prn outbreak x 3 days 08/16/19   Copland, Ilona Sorrel, PA-C    Allergies Patient has no known allergies.  Family History  Problem Relation Age of Onset  . Hypertension Mother   . Diabetes Mother   . Headache Mother   . Hypertension Father   . Lung cancer Father   . Asthma Father   . Heart disease Son        Aortic Valve Disorder Stenosis-had balloon valvuloplasty, on meds  . Ovarian cancer Maternal Grandmother   . Colon cancer Maternal Grandfather     Social  History Social History   Tobacco Use  . Smoking status: Former Games developer  . Smokeless tobacco: Never Used  Substance Use Topics  . Alcohol use: No    Alcohol/week: 0.0 standard drinks  . Drug use: No    Review of Systems Constitutional: No fever/chills Cardiovascular: Denies chest pain. Respiratory: Denies shortness of breath. Gastrointestinal: No abdominal pain.  No nausea, no vomiting.  Genitourinary: Negative for dysuria.  No history of kidney stones. Musculoskeletal: Positive cervical, thoracic and lumbar spine pain.  Positive bilateral shoulder pain. Skin: Negative for rash. Neurological:  Negative for headaches, focal weakness or numbness. ____________________________________________   PHYSICAL EXAM:  VITAL SIGNS: ED Triage Vitals [01/19/20 1037]  Enc Vitals Group     BP 127/64     Pulse Rate 74     Resp 18     Temp (!) 97.4 F (36.3 C)     Temp Source Oral     SpO2 98 %     Weight 248 lb (112.5 kg)     Height 5\' 5"  (1.651 m)     Head Circumference      Peak Flow      Pain Score 8     Pain Loc      Pain Edu?      Excl. in [place]?    Constitutional: Alert and oriented. Well appearing and in no acute distress. Eyes: Conjunctivae are normal.  Head: Atraumatic. Neck: No stridor.   Cardiovascular: Normal rate, regular rhythm. Grossly normal heart sounds.  Good peripheral circulation. Respiratory: Normal respiratory effort.  No retractions. Lungs CTAB. Gastrointestinal: Soft and nontender. No distention. No CVA tenderness. Musculoskeletal:  Minimal tenderness on palpation of the cervical spine posteriorly.  There is tenderness on palpation of the paraspinous muscles bilaterally and into the trapezius muscles.  Also rhomboid muscles bilaterally were tender to light palpation.  Scapula and parascapular muscles are also tender.  There is no point tenderness on palpation of thoracic or lumbar spine however bilateral paravertebral muscles are tender to light palpation.  No rash or skin discoloration is noted.  Patient has guarded range of motion secondary to discomfort.  No erythema or warmth is noted to the joints.  Patient is able to stand without any assistance. Neurologic:  Normal speech and language. No gross focal neurologic deficits are appreciated. No gait instability. Skin:  Skin is warm, dry and intact. No rash noted. Psychiatric: Mood and affect are normal. Speech and behavior are normal.  ____________________________________________   LABS (all labs ordered are listed, but only abnormal results are displayed)  Labs Reviewed  URINALYSIS, COMPLETE (UACMP) WITH  MICROSCOPIC - Abnormal; Notable for the following components:      Result Value   Color, Urine YELLOW (*)    APPearance CLEAR (*)    Hgb urine dipstick SMALL (*)    Leukocytes,Ua TRACE (*)    Bacteria, UA RARE (*)    All other components within normal limits   _ RADIOLOGY  ED MD interpretation:   X-rays from January 2021 were reviewed.  Official radiology report(s): No results found.  ____________________________________________   PROCEDURES  Procedure(s) performed (including Critical Care):  Procedures   ____________________________________________   INITIAL IMPRESSION / ASSESSMENT AND PLAN / ED COURSE  As part of my medical decision making, I reviewed the following data within the electronic MEDICAL RECORD NUMBER Notes from prior ED visits and [place] Controlled Substance Database  28 year old female presents to the ED with complaint of cervical, thoracic and bilateral shoulder pain  without history of injury.  Patient was seen in January for similar symptoms and x-rays were reviewed.  Patient does have some minimal degenerative changes.  She states that she was unable to get comfortable for the last 4 days due to her pain.  She denies any new injury since her last x-rays.  Patient was given methocarbamol 1000 mg p.o. and hydrocodone while in the ED.  Urinalysis was negative for infection and also did not suggest kidney stone.  Patient was made aware.  She is to follow-up with her PCP or [facility] orthopedic department as they are on call today.  Patient was discharged with a prescription for methocarbamol and Norco.  She is encouraged to use ice or heat to her back as needed for discomfort.  ____________________________________________   FINAL CLINICAL IMPRESSION(S) / ED DIAGNOSES  Final diagnoses:  Musculoskeletal back pain     ED Discharge Orders         Ordered    methocarbamol (ROBAXIN) 500 MG tablet     01/19/20 1227    HYDROcodone-acetaminophen (NORCO/VICODIN)  5-325 MG tablet  Every 6 hours PRN     01/19/20 1227           Note:  This document was prepared using Dragon voice recognition software and may include unintentional dictation errors.    Johnn Hai, PA-C 01/19/20 1303    Harvest Dark, MD 01/19/20 1318

## 2020-01-19 NOTE — ED Triage Notes (Signed)
States she is having pain from neck into mid back  States pain started about 4 days ago  Denies any injury

## 2020-02-04 ENCOUNTER — Encounter: Payer: Self-pay | Admitting: Emergency Medicine

## 2020-02-04 ENCOUNTER — Emergency Department: Payer: Medicaid Other

## 2020-02-04 ENCOUNTER — Emergency Department
Admission: EM | Admit: 2020-02-04 | Discharge: 2020-02-04 | Disposition: A | Discharge status: Home / Self Care | Payer: Medicaid Other | Attending: Student | Admitting: Student

## 2020-02-04 ENCOUNTER — Other Ambulatory Visit: Payer: Self-pay

## 2020-02-04 DIAGNOSIS — Z87891 Personal history of nicotine dependence: Secondary | ICD-10-CM | POA: Insufficient documentation

## 2020-02-04 DIAGNOSIS — M542 Cervicalgia: Secondary | ICD-10-CM | POA: Diagnosis not present

## 2020-02-04 DIAGNOSIS — Z79899 Other long term (current) drug therapy: Secondary | ICD-10-CM | POA: Insufficient documentation

## 2020-02-04 DIAGNOSIS — M546 Pain in thoracic spine: Secondary | ICD-10-CM | POA: Insufficient documentation

## 2020-02-04 DIAGNOSIS — M5489 Other dorsalgia: Secondary | ICD-10-CM | POA: Diagnosis present

## 2020-02-04 DIAGNOSIS — M545 Low back pain: Secondary | ICD-10-CM | POA: Insufficient documentation

## 2020-02-04 DIAGNOSIS — G8929 Other chronic pain: Secondary | ICD-10-CM | POA: Insufficient documentation

## 2020-02-04 MED ORDER — DICLOFENAC SODIUM 75 MG PO TBEC: 75.0000 mg | DELAYED_RELEASE_TABLET | Freq: Two times a day (BID) | ORAL | 0 refills | Status: DC

## 2020-02-04 MED ORDER — DEXAMETHASONE SODIUM PHOSPHATE 10 MG/ML IJ SOLN
10.0000 mg | Freq: Once | INTRAMUSCULAR | Status: AC
  Administered 2020-02-04: 10 mg via INTRAMUSCULAR
  Filled 2020-02-04: qty 1

## 2020-02-04 MED ORDER — LIDOCAINE 5 % EX PTCH: 1.0000 | MEDICATED_PATCH | Freq: Two times a day (BID) | CUTANEOUS | 0 refills | Status: DC

## 2020-02-04 MED ORDER — HYDROCODONE-ACETAMINOPHEN 7.5-325 MG PO TABS
1.0000 | ORAL_TABLET | Freq: Once | ORAL | Status: AC
  Administered 2020-02-04: 1 via ORAL
  Filled 2020-02-04: qty 1

## 2020-02-04 MED ORDER — TRAMADOL HCL 50 MG PO TABS: 50.0000 mg | ORAL_TABLET | Freq: Once | ORAL | Status: DC

## 2020-02-04 NOTE — ED Provider Notes (Signed)
[facility] Emergency Department Provider Note ____________________________________________  Time seen: 67  I have reviewed the triage vital signs and the nursing notes.  HISTORY  Chief Complaint  Back Pain   HPI Lisa House is a 29 y.o. female presents to the ER ER today with complaint of neck and back pain.  She reports this has been an ongoing issue for years but worsened in the last 3 to 4 months.  She was seen in the ER 1/31 and 3/11 for the same.  She describes the pain as stiff first thing in the morning but better throughout the day.  She reports when the back pain plain flares it feels throbbing but can be sharp with certain movements.  The pain radiates from her lower back up into her shoulders and neck.  She denies numbness, tingling or weakness of upper or lower extremities.  She reports some associated headaches which she attributes to the muscle tension.  X-ray of the lumbar spine 1/31 showed multilevel degenerative disc disease.  X-ray of the thoracic spine 1/31 was normal.  She reports she takes Tylenol multiple times a day.  She reports Hydrocodone will take the edge off.  She reports muscle relaxers are ineffective.  She was placed to follow-up with an orthopedist but lost the referral information.  Past Medical History:  Diagnosis Date  . Allergy   . Depression   . Eczema   . GERD (gastroesophageal reflux disease)   . Intertrigo   . Obesity   . OCD (obsessive compulsive disorder)   . Panic attack     Patient Active Problem List   Diagnosis Date Noted  . Right ovarian cyst 08/16/2019  . Family history of congenital aortic stenosis 09/23/2017  . Herpes genitalis 02/08/2017  . IBS (irritable bowel syndrome) 05/09/2015  . Morbid obesity (HCC) 05/09/2015  . OCD (obsessive compulsive disorder) 05/09/2015  . Bilateral polycystic ovarian syndrome 05/09/2015  . Psoriasis 05/09/2015  . History of depression 05/09/2015  . Allergic rhinitis  05/02/2010    Past Surgical History:  Procedure Laterality Date  . CHOLECYSTECTOMY  01/09/2011   Dr. Excell Seltzer    Prior to Admission medications   Medication Sig Start Date End Date Taking? Authorizing Provider  acetaminophen (TYLENOL) 500 MG tablet Take 1 tablet (500 mg total) by mouth every 6 (six) hours as needed. 12/22/19   Enid Derry, PA-C  diclofenac (VOLTAREN) 75 MG EC tablet Take 1 tablet (75 mg total) by mouth 2 (two) times daily. 02/04/20   Lorre Munroe, NP  HYDROcodone-acetaminophen (NORCO/VICODIN) 5-325 MG tablet Take 1 tablet by mouth every 6 (six) hours as needed for moderate pain. 01/19/20   Tommi Rumps, PA-C  levonorgestrel (MIRENA, 52 MG,) 20 MCG/24HR IUD 1 Intra Uterine Device (1 each total) by Intrauterine route once for 1 dose. 06/15/18 06/15/18  [name], MD  lidocaine (LIDODERM) 5 % Place 1 patch onto the skin every 12 (twelve) hours. Remove & Discard patch within 12 hours or as directed by MD 02/04/20 02/03/21  Lorre Munroe, NP  methocarbamol (ROBAXIN) 500 MG tablet 1-2 tablets every 6 hours prn muscle spasms 01/19/20   Bridget Hartshorn L, PA-C  pimecrolimus (ELIDEL) 1 % cream APPLY EXTERNALLY TO THE AFFECTED AREA TWICE DAILY 05/12/19   [name], MD  triamcinolone cream (KENALOG) 0.1 % Apply 1 application topically 2 (two) times daily. 05/12/19   [name], MD  valACYclovir (VALTREX) 500 MG tablet Take 1 tablet (500 mg total) by mouth  2 (two) times daily as needed. Prn outbreak x 3 days 19/5/09   Copland, Deirdre Evener, PA-C    Allergies Patient has no known allergies.  Family History  Problem Relation Age of Onset  . Hypertension Mother   . Diabetes Mother   . Headache Mother   . Hypertension Father   . Lung cancer Father   . Asthma Father   . Heart disease Son        Aortic Valve Disorder Stenosis-had balloon valvuloplasty, on meds  . Ovarian cancer Maternal Grandmother   . Colon cancer Maternal Grandfather     Social History Social  History   Tobacco Use  . Smoking status: Former Research scientist (life sciences)  . Smokeless tobacco: Never Used  Substance Use Topics  . Alcohol use: No    Alcohol/week: 0.0 standard drinks  . Drug use: No    Review of Systems  Constitutional: Negative for fever, chills or body aches. Cardiovascular: Negative for chest pain or chest tightness. Respiratory: Negative for cough or shortness of breath. Gastrointestinal: Negative for bowel incontinence. Genitourinary: Negative for bladder incontinence. Musculoskeletal: Positive for neck and back pain. Skin: Negative for rash. Neurological: Positive for intermittent headaches. Negative for focal weakness, tingling or numbness. ____________________________________________  PHYSICAL EXAM:  VITAL SIGNS: ED Triage Vitals  Enc Vitals Group     BP --      Pulse Rate 02/04/20 1816 87     Resp 02/04/20 1816 18     Temp 02/04/20 1816 98.3 F (36.8 C)     Temp Source 02/04/20 1816 Oral     SpO2 02/04/20 1816 100 %     Weight 02/04/20 1814 248 lb (112.5 kg)     Height 02/04/20 1814 5\' 5"  (1.651 m)     Head Circumference --      Peak Flow --      Pain Score 02/04/20 1814 8     Pain Loc --      Pain Edu? --      Excl. in [place]? --     Constitutional: Alert and oriented. Obese, appears in pain. Head: Normocephalic and atraumatic. Eyes: Conjunctivae are normal. Normal extraocular movements ECardiovascular: Normal rate, regular rhythm. Radial and pedal pulses 2+ bilaterally. Respiratory: Normal respiratory effort. No wheezes/rales/rhonchi. Musculoskeletal: Decreased flexion and extension of the thoracolumbar spine due to pain. Normal rotation and lateral bending. Pain with palpation over the thoracic spine. Decreased flexion, extension, rotation and lateral bending of the cervical spine. No bony tenderness noted over the cervical spine but pain with palpation of bilateral paracervical muscles. Shoulder shrug equal. Strength 5/5 BUE/BLE. Able to stand on heels and  toes. Neurologic:  Normal speech and language. No gross focal neurologic deficits are appreciated. Skin:  Skin is warm, dry and intact. No rash noted. ____________________________________________   RADIOLOGY  Imaging Orders     DG Cervical Spine 2-3 Views  ____________________________________________   INITIAL IMPRESSION / ASSESSMENT AND PLAN / ED COURSE  Chronic Back Pain, Chronic Neck Pain:  Xray thoracic and lumbar spine reviewed- no indication for repeat imaging Xray cervical spine negative Decadron 10 mg IM x 1 Hydrocodone 7.5-325 mg PO x 1 She declines muscle relaxer due to ineffectiveness RX for Diclofenac 75 mg BID- consume with food RX for Lidoderm patches Work note provided Will have her follow up with ortho as an outpatient    I reviewed the patient's prescription history over the last 12 months in the multi-state controlled substances database(s) that includes [place], [place], [place], [place],  [place], [place], [place], [place], [place], [place], [place], [place], [place], and [place].  Results were notable for Tramadol #8, Tylenol 3 #12, Hydrocodone #15. ____________________________________________  FINAL CLINICAL IMPRESSION(S) / ED DIAGNOSES  Final diagnoses:  Chronic midline low back pain without sciatica  Chronic midline thoracic back pain  Chronic neck pain      Lorre Munroe, NP 02/04/20 Serena Croissant    Miguel Aschoff., MD 02/05/20 [phone]

## 2020-02-04 NOTE — ED Notes (Signed)
Pt states chronic back pain since she was young. Dx with arthritis at 18. States has been getting worse "but no one believes me".  Had a referral to ortho but lost it and needs another referral. States back pain and stiff neck.

## 2020-02-04 NOTE — ED Triage Notes (Signed)
Pt arrived via POV with reports of back pain, states she has had XR and dx with arthritis. Pt states the back pain continues.  Pt reports she was referred to ortho, but has not yet been called, pt states she lost her paperwork with who she was referred to.  Pt ambulatory to triage without difficulty. Pt states she was taking tylenol but stopped because she didn't want to shut her organs down by taking 4-5 pills per day.  Pt states the back pain is from the middle of her back up as well as her neck.  Pt states she had to call out of work because of the pain. States it took her 2 hours to get ready to come to ED because of the pain.

## 2020-02-04 NOTE — Discharge Instructions (Signed)
You were seen today for back and neck pain.  The x-ray of your cervical spine is normal.  We gave you an injection of steroids today.  I am prescribing you an anti-inflammatory to take twice daily with food, avoid all other NSAIDs OTC.  You can take Tylenol at 1000 mg every 8 hours.  I have also given you a prescription for Lidoderm patches.  I encourage stretching, massage and weight loss.  Call orthopedics to schedule a follow-up appointment for further evaluation.

## 2020-02-06 ENCOUNTER — Telehealth: Payer: Self-pay

## 2020-02-06 NOTE — Telephone Encounter (Signed)
Copied from CRM [id number]. Topic: General - Inquiry >> Feb 06, 2020 12:35 PM Leary Roca wrote: Reason for CRM: Patient is wanting a referral to a pain management dr for severe back pain . Please reach out to pt

## 2020-02-08 ENCOUNTER — Encounter: Payer: Self-pay | Admitting: Family Medicine

## 2020-02-08 ENCOUNTER — Ambulatory Visit (INDEPENDENT_AMBULATORY_CARE_PROVIDER_SITE_OTHER): Payer: Medicaid Other | Admitting: Family Medicine

## 2020-02-08 DIAGNOSIS — F341 Dysthymic disorder: Secondary | ICD-10-CM

## 2020-02-08 DIAGNOSIS — G8929 Other chronic pain: Secondary | ICD-10-CM

## 2020-02-08 DIAGNOSIS — M546 Pain in thoracic spine: Secondary | ICD-10-CM

## 2020-02-08 MED ORDER — DULOXETINE HCL 30 MG PO CPEP: 30.0000 mg | ORAL_CAPSULE | Freq: Every day | ORAL | 0 refills | Status: DC

## 2020-02-08 NOTE — Progress Notes (Signed)
Name: Lisa House   MRN: [id number]    DOB: 04-30-1991   Date:02/08/2020       Progress Note  Subjective  Chief Complaint  Chief Complaint  Patient presents with  . Back Pain  . Referral    I connected with  Lisa House  on 02/08/20 at  2:20 PM EDT by a video enabled telemedicine application and verified that I am speaking with the correct person using two identifiers.  I discussed the limitations of evaluation and management by telemedicine and the availability of in person appointments. The patient expressed understanding and agreed to proceed. Staff also discussed with the patient that there may be a patient responsible charge related to this service. Patient Location: at home Provider Location: [facility]   HPI  Upper back pain:: patient fell from a ladder on 12/11/2019 and went to [facility], she had a history of chronic back pain but it exacerbated with the fall. She has been treated with Tramadol, lidocaine patch , tramadol and baclofen. She has been to [facility] two more times since January and also had an appointment in our office with Maurice Small. She states back pain started at age 29. She states at the time she had an MRI and was diagnosed with DDD. She states pain has been worse over the past year. She states pain used to be lower back pain and shooting like, but now is thoracic pain described as aching and tightness that is constant , current pain is 7-8/10 sometimes shooting and radiating to her neck. She has some numbness on thumb , 1st and 2nd fingers of both hands intermittently. No weakness on arms or legs, no bowel or bladder incontinence. Worse symptoms when she first gets up and at the end of the day. Having to take breaks at work to stretch her back . She states the pain is affecting her mood, not able to do things with her family . She would like to get help    Patient Active Problem List   Diagnosis Date Noted  . Right ovarian cyst 08/16/2019  . Family history of congenital  aortic stenosis 09/23/2017  . Herpes genitalis 02/08/2017  . IBS (irritable bowel syndrome) 05/09/2015  . Morbid obesity (HCC) 05/09/2015  . OCD (obsessive compulsive disorder) 05/09/2015  . Bilateral polycystic ovarian syndrome 05/09/2015  . Psoriasis 05/09/2015  . History of depression 05/09/2015  . Allergic rhinitis 05/02/2010    Past Surgical History:  Procedure Laterality Date  . CHOLECYSTECTOMY  01/09/2011   Dr. Excell Seltzer    Family History  Problem Relation Age of Onset  . Hypertension Mother   . Diabetes Mother   . Headache Mother   . Hypertension Father   . Lung cancer Father   . Asthma Father   . Heart disease Son        Aortic Valve Disorder Stenosis-had balloon valvuloplasty, on meds  . Ovarian cancer Maternal Grandmother   . Colon cancer Maternal Grandfather     Social History   Tobacco Use  . Smoking status: Former Games developer  . Smokeless tobacco: Never Used  Substance Use Topics  . Alcohol use: No    Alcohol/week: 0.0 standard drinks    Current Outpatient Medications:  .  acetaminophen (TYLENOL) 500 MG tablet, Take 1 tablet (500 mg total) by mouth every 6 (six) hours as needed., Disp: 30 tablet, Rfl: 0 .  diclofenac (VOLTAREN) 75 MG EC tablet, Take 1 tablet (75 mg total) by mouth 2 (two) times daily.,  Disp: 30 tablet, Rfl: 0 .  HYDROcodone-acetaminophen (NORCO/VICODIN) 5-325 MG tablet, Take 1 tablet by mouth every 6 (six) hours as needed for moderate pain., Disp: 15 tablet, Rfl: 0 .  levonorgestrel (MIRENA, 52 MG,) 20 MCG/24HR IUD, 1 Intra Uterine Device (1 each total) by Intrauterine route once for 1 dose., Disp: 1 each, Rfl: 0 .  lidocaine (LIDODERM) 5 %, Place 1 patch onto the skin every 12 (twelve) hours. Remove & Discard patch within 12 hours or as directed by MD, Disp: 10 patch, Rfl: 0 .  methocarbamol (ROBAXIN) 500 MG tablet, 1-2 tablets every 6 hours prn muscle spasms, Disp: 20 tablet, Rfl: 0 .  pimecrolimus (ELIDEL) 1 % cream, APPLY EXTERNALLY TO THE  AFFECTED AREA TWICE DAILY, Disp: 100 g, Rfl: 0 .  triamcinolone cream (KENALOG) 0.1 %, Apply 1 application topically 2 (two) times daily., Disp: 453.6 g, Rfl: 0 .  valACYclovir (VALTREX) 500 MG tablet, Take 1 tablet (500 mg total) by mouth 2 (two) times daily as needed. Prn outbreak x 3 days, Disp: 30 tablet, Rfl: 1  No Known Allergies  I personally reviewed active problem list, medication list, allergies, family history, social history with the patient/caregiver today.   ROS  Ten systems reviewed and is negative except as mentioned in HPI   Objective  Virtual encounter, vitals not obtained.  There is no height or weight on file to calculate BMI.  Physical Exam  Awake, alert and oriented , she states she is able to move her spine   PHQ2/9: Depression screen [facility] 2/9 02/08/2020 12/13/2019 07/08/2019 05/12/2019 07/02/2018  Decreased Interest 1 0 0 1 0  Down, Depressed, Hopeless 1 0 0 1 0  PHQ - 2 Score 2 0 0 2 0  Altered sleeping 0 0 0 0 0  Tired, decreased energy 1 0 1 1 3   Change in appetite 0 0 0 0 0  Feeling bad or failure about yourself  0 0 0 0 0  Trouble concentrating 0 0 0 0 0  Moving slowly or fidgety/restless 0 0 0 0 0  Suicidal thoughts 0 0 0 0 0  PHQ-9 Score 3 0 1 3 3   Difficult doing work/chores Not difficult at all Not difficult at all Not difficult at all Not difficult at all Not difficult at all   PHQ-2/9 Result is positive.    Fall Risk: Fall Risk  02/08/2020 12/13/2019 07/08/2019 05/12/2019 07/02/2018  Falls in the past year? 0 1 0 0 Yes  Number falls in past yr: 0 0 0 0 1  Injury with Fall? 0 1 0 0 No  Follow up - Falls evaluation completed - - -    Assessment & Plan   1. Chronic bilateral thoracic back pain  - Ambulatory referral to Orthopedic Surgery  2. Dysthymia  - DULoxetine (CYMBALTA) 30 MG capsule; Take 1 capsule (30 mg total) by mouth daily.  Dispense: 30 capsule; Refill: 0 I discussed the assessment and treatment plan with the patient. The patient  was provided an opportunity to ask questions and all were answered. The patient agreed with the plan and demonstrated an understanding of the instructions.  The patient was advised to call back or seek an in-person evaluation if the symptoms worsen or if the condition fails to improve as anticipated.  I provided 15 minutes of non-face-to-face time during this encounter.

## 2020-02-14 DIAGNOSIS — M542 Cervicalgia: Secondary | ICD-10-CM | POA: Insufficient documentation

## 2020-03-31 ENCOUNTER — Other Ambulatory Visit: Payer: Self-pay

## 2020-03-31 ENCOUNTER — Emergency Department
Admission: EM | Admit: 2020-03-31 | Discharge: 2020-04-01 | Disposition: A | Discharge status: Home / Self Care | Payer: Medicaid Other | Attending: Emergency Medicine | Admitting: Emergency Medicine

## 2020-03-31 ENCOUNTER — Emergency Department: Payer: Medicaid Other

## 2020-03-31 DIAGNOSIS — G44209 Tension-type headache, unspecified, not intractable: Secondary | ICD-10-CM

## 2020-03-31 DIAGNOSIS — M545 Low back pain: Secondary | ICD-10-CM | POA: Insufficient documentation

## 2020-03-31 DIAGNOSIS — S39012A Strain of muscle, fascia and tendon of lower back, initial encounter: Secondary | ICD-10-CM

## 2020-03-31 DIAGNOSIS — Z87891 Personal history of nicotine dependence: Secondary | ICD-10-CM | POA: Diagnosis not present

## 2020-03-31 DIAGNOSIS — R0789 Other chest pain: Secondary | ICD-10-CM | POA: Insufficient documentation

## 2020-03-31 DIAGNOSIS — R079 Chest pain, unspecified: Secondary | ICD-10-CM | POA: Diagnosis present

## 2020-03-31 DIAGNOSIS — Z79899 Other long term (current) drug therapy: Secondary | ICD-10-CM | POA: Diagnosis not present

## 2020-03-31 LAB — CBC
HCT: 39.6 % (ref 36.0–46.0)
Hemoglobin: 12.9 g/dL (ref 12.0–15.0)
MCH: 27.1 pg (ref 26.0–34.0)
MCHC: 32.6 g/dL (ref 30.0–36.0)
MCV: 83.2 fL (ref 80.0–100.0)
Platelets: 224 10*3/uL (ref 150–400)
RBC: 4.76 MIL/uL (ref 3.87–5.11)
RDW: 13.3 % (ref 11.5–15.5)
WBC: 11 10*3/uL — ABNORMAL HIGH (ref 4.0–10.5)
nRBC: 0 % (ref 0.0–0.2)

## 2020-03-31 LAB — BASIC METABOLIC PANEL
Anion gap: 8 (ref 5–15)
BUN: 17 mg/dL (ref 6–20)
CO2: 28 mmol/L (ref 22–32)
Calcium: 9 mg/dL (ref 8.9–10.3)
Chloride: 103 mmol/L (ref 98–111)
Creatinine, Ser: 0.77 mg/dL (ref 0.44–1.00)
GFR calc Af Amer: >60 mL/min (ref >60)
GFR calc non Af Amer: >60 mL/min (ref >60)
Glucose, Bld: 81 mg/dL (ref 70–99)
Potassium: 3.8 mmol/L (ref 3.5–5.1)
Sodium: 139 mmol/L (ref 135–145)

## 2020-03-31 LAB — POCT PREGNANCY, URINE: Preg Test, Ur: NEGATIVE

## 2020-03-31 LAB — TROPONIN I (HIGH SENSITIVITY): Troponin I (High Sensitivity): <2 ng/L (ref <18)

## 2020-03-31 MED ORDER — SODIUM CHLORIDE 0.9% FLUSH: 3.0000 mL | Freq: Once | INTRAVENOUS | Status: DC

## 2020-03-31 MED ORDER — LACTATED RINGERS IV BOLUS
1000.0000 mL | Freq: Once | INTRAVENOUS | Status: AC
  Administered 2020-04-01: 1000 mL via INTRAVENOUS

## 2020-03-31 MED ORDER — METOCLOPRAMIDE HCL 5 MG/ML IJ SOLN
10.0000 mg | Freq: Once | INTRAMUSCULAR | Status: AC
  Administered 2020-03-31: 10 mg via INTRAVENOUS
  Filled 2020-03-31: qty 2

## 2020-03-31 MED ORDER — KETOROLAC TROMETHAMINE 30 MG/ML IJ SOLN
15.0000 mg | Freq: Once | INTRAMUSCULAR | Status: AC
  Administered 2020-03-31: 15 mg via INTRAVENOUS
  Filled 2020-03-31: qty 1

## 2020-03-31 NOTE — ED Triage Notes (Signed)
Patient reports mid chest pain that radiates into left axilla since approximately 1 pm today.  Reports pain comes and goes and its achy and sharpe.

## 2020-03-31 NOTE — ED Notes (Signed)
First nurse note: Pt came in for headache x a few days but added on chest pain today.

## 2020-03-31 NOTE — ED Provider Notes (Signed)
[facility] Emergency Department Provider Note  ____________________________________________  Time seen: Approximately 11:45 PM  I have reviewed the triage vital signs and the nursing notes.   HISTORY  Chief Complaint Chest Pain   HPI Lisa House is a 29 y.o. female with a history of depression, GERD, elevated BMI, OCD, panic attacks, IBS who presents for evaluation of several complaints.  Patient reports that she has had 3 days of generalized throbbing moderate headache.  Headache improves with over-the-counter medications but recurs.  Patient reports having similar headaches a couple times a year.  No photophobia or phonophobia, no neck stiffness, no fever, no sore throat, no dysarthria, no dysphagia, no diplopia, no unilateral weakness or numbness, no slurred speech, no thunderclap headache, no gait abnormalities.   The headache at this time is 7 out of 10.  She is also complaining of intermittent chest pain that started at 1 PM.  She describes the pain as intermittent, soreness, diffuse across her chest, that has happened several times today lasting few minutes at a time and resolving without intervention.  She has no chest pain at this time.  No wheezing, no cough, no shortness of breath, no personal or family history of blood clots, no recent travel immobilization, no leg pain or swelling, no hemoptysis.  She does have an IUD Mirena placed.  She is also complaining of low back pain.  She reports the back pain as soreness, diffuse in her lower back that started after she had to load several boxes from a truck at work earlier today.  No saddle anesthesia, no lower extremity weakness or numbness, no bowel or urinary incontinence or retention, no trauma, no dysuria or hematuria.  No Covid vaccines.  Past Medical History:  Diagnosis Date  . Allergy   . Depression   . Eczema   . GERD (gastroesophageal reflux disease)   . Intertrigo   . Obesity   . OCD  (obsessive compulsive disorder)   . Panic attack     Patient Active Problem List   Diagnosis Date Noted  . Right ovarian cyst 08/16/2019  . Family history of congenital aortic stenosis 09/23/2017  . Herpes genitalis 02/08/2017  . IBS (irritable bowel syndrome) 05/09/2015  . Morbid obesity (HCC) 05/09/2015  . OCD (obsessive compulsive disorder) 05/09/2015  . Bilateral polycystic ovarian syndrome 05/09/2015  . Psoriasis 05/09/2015  . History of depression 05/09/2015  . Allergic rhinitis 05/02/2010    Past Surgical History:  Procedure Laterality Date  . CHOLECYSTECTOMY  01/09/2011   Dr. Excell Seltzer    Prior to Admission medications   Medication Sig Start Date End Date Taking? Authorizing Provider  acetaminophen (TYLENOL) 500 MG tablet Take 1 tablet (500 mg total) by mouth every 6 (six) hours as needed. 12/22/19   Enid Derry, PA-C  diclofenac (VOLTAREN) 75 MG EC tablet Take 1 tablet (75 mg total) by mouth 2 (two) times daily. 02/04/20   Lorre Munroe, NP  DULoxetine (CYMBALTA) 30 MG capsule Take 1 capsule (30 mg total) by mouth daily. 02/08/20   [name], MD  HYDROcodone-acetaminophen (NORCO/VICODIN) 5-325 MG tablet Take 1 tablet by mouth every 6 (six) hours as needed for moderate pain. 01/19/20   Tommi Rumps, PA-C  levonorgestrel (MIRENA, 52 MG,) 20 MCG/24HR IUD 1 Intra Uterine Device (1 each total) by Intrauterine route once for 1 dose. 06/15/18 06/15/18  [name], MD  lidocaine (LIDODERM) 5 % Place 1 patch onto the skin every 12 (twelve) hours. Remove &  Discard patch within 12 hours or as directed by MD 02/04/20 02/03/21  Lorre Munroe, NP  methocarbamol (ROBAXIN) 500 MG tablet 1-2 tablets every 6 hours prn muscle spasms 01/19/20   Bridget Hartshorn L, PA-C  pimecrolimus (ELIDEL) 1 % cream APPLY EXTERNALLY TO THE AFFECTED AREA TWICE DAILY 05/12/19   [name], MD  triamcinolone cream (KENALOG) 0.1 % Apply 1 application topically 2 (two) times daily. 05/12/19   [name], MD  valACYclovir (VALTREX) 500 MG tablet Take 1 tablet (500 mg total) by mouth 2 (two) times daily as needed. Prn outbreak x 3 days 08/16/19   Copland, Ilona Sorrel, PA-C    Allergies Patient has no known allergies.  Family History  Problem Relation Age of Onset  . Hypertension Mother   . Diabetes Mother   . Headache Mother   . Hypertension Father   . Lung cancer Father   . Asthma Father   . Heart disease Son        Aortic Valve Disorder Stenosis-had balloon valvuloplasty, on meds  . Ovarian cancer Maternal Grandmother   . Colon cancer Maternal Grandfather     Social History Social History   Tobacco Use  . Smoking status: Former Games developer  . Smokeless tobacco: Never Used  Substance Use Topics  . Alcohol use: No    Alcohol/week: 0.0 standard drinks  . Drug use: No    Review of Systems  Constitutional: Negative for fever. Eyes: Negative for visual changes. ENT: Negative for sore throat. Neck: No neck pain  Cardiovascular: + chest pain. Respiratory: Negative for shortness of breath. Gastrointestinal: Negative for abdominal pain, vomiting or diarrhea. Genitourinary: Negative for dysuria. Musculoskeletal: + back pain. Skin: Negative for rash. Neurological: Negative for weakness or numbness. + HA Psych: No SI or HI  ____________________________________________   PHYSICAL EXAM:  VITAL SIGNS: ED Triage Vitals  Enc Vitals Group     BP 03/31/20 2016 137/72     Pulse Rate 03/31/20 2016 81     Resp 03/31/20 2016 20     Temp 03/31/20 2016 98.3 F (36.8 C)     Temp Source 03/31/20 2016 Oral     SpO2 03/31/20 2016 100 %     Weight 03/31/20 2014 242 lb (109.8 kg)     Height 03/31/20 2014 5\' 6"  (1.676 m)     Head Circumference --      Peak Flow --      Pain Score 03/31/20 2013 7     Pain Loc --      Pain Edu? --      Excl. in GC? --     Constitutional: Alert and oriented. Well appearing and in no apparent distress. HEENT:      Head: Normocephalic and  atraumatic.         Eyes: Conjunctivae are normal. Sclera is non-icteric.       Mouth/Throat: Mucous membranes are moist.       Neck: Supple with no signs of meningismus. Cardiovascular: Regular rate and rhythm. No murmurs, gallops, or rubs. 2+ symmetrical distal pulses are present in all extremities. No JVD. Respiratory: Normal respiratory effort. Lungs are clear to auscultation bilaterally. No wheezes, crackles, or rhonchi.  Gastrointestinal: Soft, non tender, and non distended with positive bowel sounds. No rebound or guarding. Genitourinary: No CVA tenderness. Musculoskeletal: Nontender with normal range of motion in all extremities. No edema, cyanosis, or erythema of extremities.  No midline CT no spine tenderness.  Mild diffuse paraspinal tenderness Neurologic: Normal  speech and language. Face is symmetric.  Pupils are equal round and reactive x2, extraocular movements are intact, intact strength and sensation x4, no pronator drift, no dysmetria Skin: Skin is warm, dry and intact. No rash noted. Psychiatric: Mood and affect are normal. Speech and behavior are normal.  ____________________________________________   LABS (all labs ordered are listed, but only abnormal results are displayed)  Labs Reviewed  CBC - Abnormal; Notable for the following components:      Result Value   WBC 11.0 (*)    All other components within normal limits  BASIC METABOLIC PANEL  POC URINE PREG, ED  POCT PREGNANCY, URINE  TROPONIN I (HIGH SENSITIVITY)  TROPONIN I (HIGH SENSITIVITY)   ____________________________________________  EKG  ED ECG REPORT I, Rudene Re, the attending physician, personally viewed and interpreted this ECG.  Normal sinus rhythm, rate of 77, normal intervals, normal axis, no ST elevations or depressions.  Normal EKG. ____________________________________________  RADIOLOGY  I have personally reviewed the images performed during this visit and I agree with the  Radiologist's read.   Interpretation by Radiologist:  DG Chest 2 View  Result Date: 03/31/2020 CLINICAL DATA:  Mid chest pain radiating to left axilla since 1 p.m. EXAM: CHEST - 2 VIEW COMPARISON:  05/29/2019 FINDINGS: The heart size and mediastinal contours are within normal limits. Both lungs are clear. The visualized skeletal structures are unremarkable. IMPRESSION: No active cardiopulmonary disease. Electronically Signed   By: Randa Ngo M.D.   On: 03/31/2020 20:48     ____________________________________________   PROCEDURES  Procedure(s) performed:yes .1-3 Lead EKG Interpretation Performed by: Rudene Re, MD Authorized by: Rudene Re, MD     Interpretation: normal     ECG rate assessment: normal     Rhythm: sinus rhythm     Ectopy: none     Conduction: normal     Critical Care performed:  None ____________________________________________   INITIAL IMPRESSION / ASSESSMENT AND PLAN / ED COURSE   29 y.o. female with a history of depression, GERD, elevated BMI, OCD, panic attacks, IBS who presents for evaluation of several complaints.   # HA: No thunderclap headache, no meningeal signs, no fever, no signs or symptoms of infection, no neurological deficits.  Patient with history of similar headaches in the past.  Most likely tension headache versus migraine.  She is otherwise extremely well-appearing.  Will treat with a migraine cocktail and reassess.  # CP: Possibly MSK as patient describes as a soreness the last a few minutes and resolved with no intervention.  No tachypnea, tachycardia, hypoxia, pleuritic chest pain, constant pain, or shortness of breath low suspicion for PE.  Chest x-ray no evidence of pneumonia, edema, pneumothorax.  EKG and troponin showing no evidence of ischemia.  Presentation for atypical chest pain.  #Back pain: Diffuse soreness of the paraspinal muscles on the back that started after patient had to carry several heavy boxes.  MSK  nature.  No midline CT no spine tenderness, normal neurological exam.  Old medical records reviewed.  Will reassess after migraine cocktail.     _________________________ 12:21 AM on 04/01/2020 -----------------------------------------  Patient reports that her symptoms have fully resolved and is requesting discharge.  Will discharge home with close follow-up with PCP.  Discussed my standard return precautions.  Reevaluation patient looks extremely well-appearing with no chest pain, no back pain, no headache.  _____________________________________________ Please note:  Patient was evaluated in Emergency Department today for the symptoms described in the history of present illness. Patient  was evaluated in the context of the global COVID-19 pandemic, which necessitated consideration that the patient might be at risk for infection with the SARS-CoV-2 virus that causes COVID-19. Institutional protocols and algorithms that pertain to the evaluation of patients at risk for COVID-19 are in a state of rapid change based on information released by regulatory bodies including the CDC and federal and state organizations. These policies and algorithms were followed during the patient's care in the ED.  Some ED evaluations and interventions may be delayed as a result of limited staffing during the pandemic.   [place] Controlled Substance Database was reviewed by me. ____________________________________________   FINAL CLINICAL IMPRESSION(S) / ED DIAGNOSES   Final diagnoses:  Atypical chest pain  Acute non intractable tension-type headache  Low back strain, initial encounter      NEW MEDICATIONS STARTED DURING THIS VISIT:  ED Discharge Orders    None       Note:  This document was prepared using Dragon voice recognition software and may include unintentional dictation errors.    Don Perking, [place], MD 04/01/20 [place] Manners

## 2020-04-01 LAB — TROPONIN I (HIGH SENSITIVITY): Troponin I (High Sensitivity): <2 ng/L (ref <18)

## 2020-04-26 ENCOUNTER — Other Ambulatory Visit: Payer: Self-pay | Admitting: Family Medicine

## 2020-04-26 DIAGNOSIS — L409 Psoriasis, unspecified: Secondary | ICD-10-CM

## 2020-04-27 DIAGNOSIS — M5416 Radiculopathy, lumbar region: Secondary | ICD-10-CM | POA: Insufficient documentation

## 2020-05-03 ENCOUNTER — Encounter: Payer: Self-pay | Admitting: *Deleted

## 2020-05-03 ENCOUNTER — Emergency Department
Admission: EM | Admit: 2020-05-03 | Discharge: 2020-05-04 | Disposition: A | Discharge status: Home / Self Care | Payer: Medicaid Other | Attending: Emergency Medicine | Admitting: Emergency Medicine

## 2020-05-03 ENCOUNTER — Other Ambulatory Visit: Payer: Self-pay

## 2020-05-03 ENCOUNTER — Emergency Department: Payer: Medicaid Other

## 2020-05-03 DIAGNOSIS — R05 Cough: Secondary | ICD-10-CM | POA: Diagnosis present

## 2020-05-03 DIAGNOSIS — Z5321 Procedure and treatment not carried out due to patient leaving prior to being seen by health care provider: Secondary | ICD-10-CM | POA: Insufficient documentation

## 2020-05-03 DIAGNOSIS — R0602 Shortness of breath: Secondary | ICD-10-CM | POA: Diagnosis not present

## 2020-05-03 LAB — BASIC METABOLIC PANEL
Anion gap: 11 (ref 5–15)
BUN: 12 mg/dL (ref 6–20)
CO2: 28 mmol/L (ref 22–32)
Calcium: 8.9 mg/dL (ref 8.9–10.3)
Chloride: 99 mmol/L (ref 98–111)
Creatinine, Ser: 1.06 mg/dL — ABNORMAL HIGH (ref 0.44–1.00)
GFR calc Af Amer: >60 mL/min (ref >60)
GFR calc non Af Amer: >60 mL/min (ref >60)
Glucose, Bld: 92 mg/dL (ref 70–99)
Potassium: 3.9 mmol/L (ref 3.5–5.1)
Sodium: 138 mmol/L (ref 135–145)

## 2020-05-03 LAB — CBC
HCT: 39.6 % (ref 36.0–46.0)
Hemoglobin: 13 g/dL (ref 12.0–15.0)
MCH: 26.9 pg (ref 26.0–34.0)
MCHC: 32.8 g/dL (ref 30.0–36.0)
MCV: 81.8 fL (ref 80.0–100.0)
Platelets: 208 10*3/uL (ref 150–400)
RBC: 4.84 MIL/uL (ref 3.87–5.11)
RDW: 13.1 % (ref 11.5–15.5)
WBC: 6.3 10*3/uL (ref 4.0–10.5)
nRBC: 0 % (ref 0.0–0.2)

## 2020-05-03 LAB — TROPONIN I (HIGH SENSITIVITY): Troponin I (High Sensitivity): 2 ng/L (ref <18)

## 2020-05-03 MED ORDER — SODIUM CHLORIDE 0.9% FLUSH: 3.0000 mL | Freq: Once | INTRAVENOUS | Status: DC

## 2020-05-03 NOTE — ED Triage Notes (Signed)
Pt ambulatory to triage.  Pt has sob, cough since last night.  No chest pain  Intermittent fever.  Pt alert  Speech clear.

## 2020-05-04 ENCOUNTER — Telehealth (INDEPENDENT_AMBULATORY_CARE_PROVIDER_SITE_OTHER): Payer: Medicaid Other | Admitting: Family Medicine

## 2020-05-04 ENCOUNTER — Encounter: Payer: Self-pay | Admitting: Family Medicine

## 2020-05-04 ENCOUNTER — Telehealth: Payer: Self-pay | Admitting: Family Medicine

## 2020-05-04 VITALS — Ht 65.0 in | Wt 239.0 lb

## 2020-05-04 DIAGNOSIS — J069 Acute upper respiratory infection, unspecified: Secondary | ICD-10-CM

## 2020-05-04 DIAGNOSIS — Z20822 Contact with and (suspected) exposure to covid-19: Secondary | ICD-10-CM | POA: Diagnosis not present

## 2020-05-04 MED ORDER — ALBUTEROL SULFATE HFA 108 (90 BASE) MCG/ACT IN AERS: 2.0000 | INHALATION_SPRAY | Freq: Four times a day (QID) | RESPIRATORY_TRACT | 1 refills | Status: DC | PRN

## 2020-05-04 MED ORDER — BENZONATATE 100 MG PO CAPS: 100.0000 mg | ORAL_CAPSULE | Freq: Three times a day (TID) | ORAL | 0 refills | Status: DC | PRN

## 2020-05-04 MED ORDER — MUCINEX 600 MG PO TB12: 600.0000 mg | ORAL_TABLET | Freq: Two times a day (BID) | ORAL | 0 refills | Status: AC

## 2020-05-04 MED ORDER — ALBUTEROL SULFATE HFA 108 (90 BASE) MCG/ACT IN AERS
2.0000 | INHALATION_SPRAY | RESPIRATORY_TRACT | 0 refills | Status: DC | PRN
Start: 2020-05-04 — End: 2021-03-05

## 2020-05-04 MED ORDER — PROMETHAZINE-DM 6.25-15 MG/5ML PO SYRP: 2.5000 mL | ORAL_SOLUTION | Freq: Three times a day (TID) | ORAL | 0 refills | Status: DC | PRN

## 2020-05-04 NOTE — Telephone Encounter (Signed)
Spoke with patient and gave her the response from provider Danelle Berry stating for patient to try and use the Good RX discount card or using the OTC medications, ie delsym, robitussin, or mucinex dm.  Patient verbalized understanding.

## 2020-05-04 NOTE — Progress Notes (Signed)
Name: Lisa House   MRN: [id number]    DOB: 1991/03/24   Date:05/04/2020       Progress Note  Subjective:    Chief Complaint  Chief Complaint  Patient presents with  . Cough    coughing so bad she vomits, sob, and congestion, onset 2 days    I connected with  [name]  on 05/04/20 at  1:40 PM EDT by a video enabled telemedicine application and verified that I am speaking with the correct person using two identifiers.  I discussed the limitations of evaluation and management by telemedicine and the availability of in person appointments. The patient expressed understanding and agreed to proceed. Staff also discussed with the patient that there may be a patient responsible charge related to this service. Patient Location: home in car in driveway Provider Location: [facility] Additional Individuals present: none HPI Fever 2 days ago, achy, chills, nasal sx, coughing so hard her ribs and back hurt, posttussive emesis, chest feels rattely, cough non-productive Onset fever 6/23 Tmax 101 No sick contacts Her son has ear infection, no COVID sick contacts, no past COVID and no COVID   Feels SOB, wheezy  Ribs sore but denies COP or DOE, no LE edema, night sweats, loss of taste or smell, she reports shes fairly isolated and doesn't go around many people so its unlikely she has been exposed to or has COVID   No PMHx of bronchitis, PNA/CAP, asthma  Patient Active Problem List   Diagnosis Date Noted  . Right ovarian cyst 08/16/2019  . Family history of congenital aortic stenosis 09/23/2017  . Herpes genitalis 02/08/2017  . IBS (irritable bowel syndrome) 05/09/2015  . Morbid obesity (HCC) 05/09/2015  . OCD (obsessive compulsive disorder) 05/09/2015  . Bilateral polycystic ovarian syndrome 05/09/2015  . Psoriasis 05/09/2015  . History of depression 05/09/2015  . Allergic rhinitis 05/02/2010    Social History   Tobacco Use  . Smoking status: Former Games developer  . Smokeless  tobacco: Never Used  Substance Use Topics  . Alcohol use: No    Alcohol/week: 0.0 standard drinks     Current Outpatient Medications:  .  acetaminophen (TYLENOL) 500 MG tablet, Take 1 tablet (500 mg total) by mouth every 6 (six) hours as needed., Disp: 30 tablet, Rfl: 0 .  pimecrolimus (ELIDEL) 1 % cream, APPLY EXTERNALLY TO THE AFFECTED AREA TWICE DAILY, Disp: 100 g, Rfl: 0 .  triamcinolone cream (KENALOG) 0.1 %, APPLY EXTERNALLY TO THE AFFECTED AREA TWICE DAILY, Disp: 30 g, Rfl: 0 .  valACYclovir (VALTREX) 500 MG tablet, Take 1 tablet (500 mg total) by mouth 2 (two) times daily as needed. Prn outbreak x 3 days, Disp: 30 tablet, Rfl: 1 .  levonorgestrel (MIRENA, 52 MG,) 20 MCG/24HR IUD, 1 Intra Uterine Device (1 each total) by Intrauterine route once for 1 dose., Disp: 1 each, Rfl: 0  No Known Allergies  I personally reviewed active problem list, medication list, allergies, family history, social history, health maintenance, notes from last encounter, lab results, imaging with the patient/caregiver today.   Review of Systems    Objective:   Virtual encounter, vitals limited, only able to obtain the following Today's Vitals   05/04/20 1323  Weight: 239 lb (108.4 kg)  Height: 5\' 5"  (1.651 m)   Body mass index is 39.77 kg/m. Nursing Note and Vital Signs reviewed.  Physical Exam Vitals and nursing note reviewed.  Constitutional:      General: She is not in acute  distress.    Appearance: She is obese. She is not ill-appearing, toxic-appearing or diaphoretic.  HENT:     Head: Normocephalic and atraumatic.     Nose: Congestion present.  Pulmonary:     Effort: Pulmonary effort is normal. No respiratory distress.     Comments: No distress, no audible wheeze or stridor, no retractions or accessory muscle use observed, a few severe coughing fits during encounter Skin:    Coloration: Skin is not jaundiced or pale.  Neurological:     Mental Status: She is alert.  Psychiatric:          Mood and Affect: Mood normal.        Behavior: Behavior normal.     PE limited by telephone encounter  Results for orders placed or performed during the hospital encounter of 05/03/20 (from the past 72 hour(s))  Basic metabolic panel     Status: Abnormal   Collection Time: 05/03/20  8:51 PM  Result Value Ref Range   Sodium 138 135 - 145 mmol/L   Potassium 3.9 3.5 - 5.1 mmol/L   Chloride 99 98 - 111 mmol/L   CO2 28 22 - 32 mmol/L   Glucose, Bld 92 70 - 99 mg/dL    Comment: Glucose reference range applies only to samples taken after fasting for at least 8 hours.   BUN 12 6 - 20 mg/dL   Creatinine, Ser 6.06 (H) 0.44 - 1.00 mg/dL   Calcium 8.9 8.9 - 30.1 mg/dL   GFR calc non Af Amer >60 >60 mL/min   GFR calc Af Amer >60 >60 mL/min   Anion gap 11 5 - 15    Comment: Performed at [facility], [address] [place], [place] [address]  CBC     Status: None   Collection Time: 05/03/20  8:51 PM  Result Value Ref Range   WBC 6.3 4.0 - 10.5 K/uL   RBC 4.84 3.87 - 5.11 MIL/uL   Hemoglobin 13.0 12.0 - 15.0 g/dL   HCT 32.3 36 - 46 %   MCV 81.8 80.0 - 100.0 fL   MCH 26.9 26.0 - 34.0 pg   MCHC 32.8 30.0 - 36.0 g/dL   RDW 55.7 32.2 - 02.5 %   Platelets 208 150 - 400 K/uL   nRBC 0.0 0.0 - 0.2 %    Comment: Performed at [facility], [address], [place], [place] [address]  Troponin I (High Sensitivity)     Status: None   Collection Time: 05/03/20  8:51 PM  Result Value Ref Range   Troponin I (High Sensitivity) 2 <18 ng/L    Comment: (NOTE) Elevated high sensitivity troponin I (hsTnI) values and significant  changes across serial measurements may suggest ACS but many other  chronic and acute conditions are known to elevate hsTnI results.  Refer to the "Links" section for chest pain algorithms and additional  guidance. Performed at [facility], [address] [place], [place] [address]     Assessment and Plan:     ICD-10-CM   1. Upper  respiratory tract infection, unspecified type  J06.9 promethazine-dextromethorphan (PROMETHAZINE-DM) 6.25-15 MG/5ML syrup    benzonatate (TESSALON) 100 MG capsule    albuterol (VENTOLIN HFA) 108 (90 Base) MCG/ACT inhaler  2. Suspected COVID-19 virus infection  Z20.822    Pt instructed to quarantine except for going through pharmacy drive-through for COVID testing and getting meds, reviewed at length ER vs UC precautions and f/up over the weekend. Unclear viral  illness - URI/Bronchitis Tx supportive and sx  F/up in the next week if not improving - would prefer to tx with nebs, but would not be able to get for pt today   -Red flags and when to present for emergency care or RTC including chest pain, shortness of breath, new/worsening/un-resolving symptoms, reviewed with patient at time of visit. Follow up and care instructions discussed and provided in AVS. - I discussed the assessment and treatment plan with the patient. The patient was provided an opportunity to ask questions and all were answered. The patient agreed with the plan and demonstrated an understanding of the instructions.  I provided 20+ minutes of non-face-to-face time during this encounter.  Delsa Grana, PA-C 05/04/20 2:01 PM

## 2020-05-04 NOTE — Telephone Encounter (Signed)
Pt called stating that none of the medications that were sent in for her today are covered by her insurance. Please advise.

## 2020-05-25 ENCOUNTER — Emergency Department
Admission: EM | Admit: 2020-05-25 | Discharge: 2020-05-25 | Disposition: A | Discharge status: Home / Self Care | Payer: Medicaid Other | Attending: Emergency Medicine | Admitting: Emergency Medicine

## 2020-05-25 ENCOUNTER — Other Ambulatory Visit: Payer: Self-pay

## 2020-05-25 ENCOUNTER — Encounter: Payer: Self-pay | Admitting: Emergency Medicine

## 2020-05-25 ENCOUNTER — Emergency Department: Payer: Medicaid Other

## 2020-05-25 DIAGNOSIS — Z87891 Personal history of nicotine dependence: Secondary | ICD-10-CM | POA: Insufficient documentation

## 2020-05-25 DIAGNOSIS — M542 Cervicalgia: Secondary | ICD-10-CM | POA: Diagnosis present

## 2020-05-25 DIAGNOSIS — M436 Torticollis: Secondary | ICD-10-CM | POA: Diagnosis not present

## 2020-05-25 MED ORDER — TRAMADOL HCL 50 MG PO TABS: 50.0000 mg | ORAL_TABLET | Freq: Four times a day (QID) | ORAL | 0 refills | Status: AC | PRN

## 2020-05-25 MED ORDER — HYDROMORPHONE HCL 1 MG/ML IJ SOLN
1.0000 mg | Freq: Once | INTRAMUSCULAR | Status: AC
  Administered 2020-05-25: 1 mg via INTRAMUSCULAR
  Filled 2020-05-25: qty 1

## 2020-05-25 MED ORDER — ORPHENADRINE CITRATE 30 MG/ML IJ SOLN
60.0000 mg | Freq: Two times a day (BID) | INTRAMUSCULAR | Status: DC
  Administered 2020-05-25: 60 mg via INTRAMUSCULAR
  Filled 2020-05-25: qty 2

## 2020-05-25 MED ORDER — ORPHENADRINE CITRATE ER 100 MG PO TB12
100.0000 mg | ORAL_TABLET | Freq: Two times a day (BID) | ORAL | 0 refills | Status: DC
Start: 2020-05-25 — End: 2021-02-07

## 2020-05-25 MED ORDER — ONDANSETRON HCL 8 MG PO TABS
8.0000 mg | ORAL_TABLET | Freq: Three times a day (TID) | ORAL | 0 refills | Status: DC | PRN
Start: 2020-05-25 — End: 2021-02-07

## 2020-05-25 NOTE — ED Triage Notes (Signed)
Neck pain since Monday---woke up with it.  Says hurts to turn head either way.

## 2020-05-25 NOTE — Discharge Instructions (Signed)
Read and follow discharge care instruction.  Be advised of drowsy effects of muscle relaxers and pain medication.

## 2020-05-25 NOTE — ED Notes (Signed)
See triage note  Presents with neck pain  States pain started on Monday   Eased off intermittently with heat and IBU  But states pain increased this am  Denies any injury but states felt a pop in neck when she was reaching for something

## 2020-05-25 NOTE — ED Provider Notes (Signed)
[facility] Emergency Department Provider Note   ____________________________________________   First MD Initiated Contact with Patient 05/25/20 [phone]     (approximate)  I have reviewed the triage vital signs and the nursing notes.   HISTORY  Chief Complaint Neck Pain    HPI Lisa House is a 29 y.o. female patient presents for 4 days of increasing neck pain.  Onset of complaint with a.m. awakening.  Patient states the 1 pain wax and wane.  Patient state from day 2 on the pain has increased with decreased range of motion secondary to pain with movement.  Patient denies radicular component to her pain.  Patient also complained of a burning sensation in the back of her neck.  Patient rates the pain as 8/10.  No palliative measure for complaint.         Past Medical History:  Diagnosis Date  . Allergy   . Depression   . Eczema   . GERD (gastroesophageal reflux disease)   . Intertrigo   . Obesity   . OCD (obsessive compulsive disorder)   . Panic attack     Patient Active Problem List   Diagnosis Date Noted  . Lumbar radiculopathy 04/27/2020  . Neck pain 02/14/2020  . Right ovarian cyst 08/16/2019  . Family history of congenital aortic stenosis 09/23/2017  . Herpes genitalis 02/08/2017  . IBS (irritable bowel syndrome) 05/09/2015  . Morbid obesity (HCC) 05/09/2015  . OCD (obsessive compulsive disorder) 05/09/2015  . Bilateral polycystic ovarian syndrome 05/09/2015  . Psoriasis 05/09/2015  . History of depression 05/09/2015  . Family history of congenital heart defect 08/08/2014  . Allergic rhinitis 05/02/2010    Past Surgical History:  Procedure Laterality Date  . CHOLECYSTECTOMY  01/09/2011   Dr. Excell Seltzer    Prior to Admission medications   Medication Sig Start Date End Date Taking? Authorizing Provider  acetaminophen (TYLENOL) 500 MG tablet Take 1 tablet (500 mg total) by mouth every 6 (six) hours as needed. 12/22/19   Enid Derry, PA-C  albuterol (VENTOLIN HFA) 108 (90 Base) MCG/ACT inhaler Inhale 2 puffs into the lungs every 6 (six) hours as needed for wheezing or shortness of breath. 05/04/20   Danelle Berry, PA-C  albuterol (VENTOLIN HFA) 108 (90 Base) MCG/ACT inhaler Inhale 2 puffs into the lungs every 4 (four) hours as needed for wheezing or shortness of breath. 05/04/20   Danelle Berry, PA-C  levonorgestrel (MIRENA, 52 MG,) 20 MCG/24HR IUD 1 Intra Uterine Device (1 each total) by Intrauterine route once for 1 dose. 06/15/18 06/15/18  [name], MD  ondansetron (ZOFRAN) 8 MG tablet Take 1 tablet (8 mg total) by mouth every 8 (eight) hours as needed for nausea or vomiting. 05/25/20   Joni Reining, PA-C  orphenadrine (NORFLEX) 100 MG tablet Take 1 tablet (100 mg total) by mouth 2 (two) times daily. 05/25/20   Joni Reining, PA-C  pimecrolimus (ELIDEL) 1 % cream APPLY EXTERNALLY TO THE AFFECTED AREA TWICE DAILY 05/12/19   [name], MD  traMADol (ULTRAM) 50 MG tablet Take 1 tablet (50 mg total) by mouth every 6 (six) hours as needed for up to 3 days. 05/25/20 05/28/20  Joni Reining, PA-C  triamcinolone cream (KENALOG) 0.1 % APPLY EXTERNALLY TO THE AFFECTED AREA TWICE DAILY 04/26/20   [name], MD  valACYclovir (VALTREX) 500 MG tablet Take 1 tablet (500 mg total) by mouth 2 (two) times daily as needed. Prn outbreak x 3 days 08/16/19  Copland, Alicia B, PA-C    Allergies Tramadol  Family History  Problem Relation Age of Onset  . Hypertension Mother   . Diabetes Mother   . Headache Mother   . Hypertension Father   . Lung cancer Father   . Asthma Father   . Heart disease Son        Aortic Valve Disorder Stenosis-had balloon valvuloplasty, on meds  . Ovarian cancer Maternal Grandmother   . Colon cancer Maternal Grandfather     Social History Social History   Tobacco Use  . Smoking status: Former Games developer  . Smokeless tobacco: Never Used  Vaping Use  . Vaping Use: Never used  Substance Use  Topics  . Alcohol use: No    Alcohol/week: 0.0 standard drinks  . Drug use: No    Review of Systems Constitutional: No fever/chills Eyes: No visual changes. ENT: No sore throat. Cardiovascular: Denies chest pain. Respiratory: Denies shortness of breath. Gastrointestinal: No abdominal pain.  No nausea, no vomiting.  No diarrhea.  No constipation. Genitourinary: Negative for dysuria. Musculoskeletal: Positive for neck pain. Skin: Negative for rash. Neurological: Negative for headaches, focal weakness or numbness. Psychiatric: Anxiety/ depression.  ____________________________________________   PHYSICAL EXAM:  VITAL SIGNS: ED Triage Vitals  Enc Vitals Group     BP 05/25/20 0904 122/68     Pulse Rate 05/25/20 0904 81     Resp 05/25/20 0904 16     Temp 05/25/20 0904 97.8 F (36.6 C)     Temp Source 05/25/20 0904 Oral     SpO2 05/25/20 0904 99 %     Weight 05/25/20 0901 238 lb 1.6 oz (108 kg)     Height 05/25/20 0901 5\' 5"  (1.651 m)     Head Circumference --      Peak Flow --      Pain Score 05/25/20 0901 8     Pain Loc --      Pain Edu? --      Excl. in GC? --    Constitutional: Alert and oriented. Well appearing and in no acute distress.  BMI 39.62. Neck: No stridor.   cervical spine tenderness to palpation.  Decreased range of motion with lateral movement and flexion. Hematological/Lymphatic/Immunilogical: No cervical lymphadenopathy. Cardiovascular: Normal rate, regular rhythm. Grossly normal heart sounds.  Good peripheral circulation. Respiratory: Normal respiratory effort.  No retractions. Lungs CTAB. Musculoskeletal: No lower extremity tenderness nor edema.  Neurologic:  Normal speech and language. No gross focal neurologic deficits are appreciated. No gait instability. Skin:  Skin is warm, dry and intact. No rash noted. Psychiatric: Mood and affect are normal. Speech and behavior are normal.  ____________________________________________   LABS (all labs  ordered are listed, but only abnormal results are displayed)  Labs Reviewed - No data to display ____________________________________________  EKG   ____________________________________________  RADIOLOGY  ED MD interpretation:    Official radiology report(s): DG Cervical Spine 2-3 Views  Result Date: 05/25/2020 CLINICAL DATA:  Four days of increasing neck pain. EXAM: CERVICAL SPINE - 2-3 VIEW COMPARISON:  Radiographs of the cervical spine 02/04/2020 FINDINGS: Nonspecific reversal of the expected cervical lordosis. Trace C3-C4 and C4-C5 grade 1 anterolisthesis. Vertebral body height is maintained. No evidence of fracture to the cervical spine. Intervertebral disc height is maintained. Unremarkable appearance of the C1-C2 articulation on the dedicated odontoid view. IMPRESSION: No radiographic evidence of fracture. No appreciable significant spondylosis. Nonspecific reversal of the expected cervical lordosis. Minimal C3-C4 and C4-C5 grade 1 anterolisthesis Electronically Signed  By: Jackey Loge DO   On: 05/25/2020 10:11    ____________________________________________   PROCEDURES  Procedure(s) performed (including Critical Care):  Procedures   ____________________________________________   INITIAL IMPRESSION / ASSESSMENT AND PLAN / ED COURSE  As part of my medical decision making, I reviewed the following data within the electronic MEDICAL RECORD NUMBER     Patient presents with neck pain that increases with movement.  Discussed x-ray findings with patient.  Patient complaint physical exam is consistent with torticollis.  Patient given discharge care instruction advised take medication as directed.          ____________________________________________   FINAL CLINICAL IMPRESSION(S) / ED DIAGNOSES  Final diagnoses:  Torticollis, acute     ED Discharge Orders         Ordered    orphenadrine (NORFLEX) 100 MG tablet  2 times daily     Discontinue  Reprint      05/25/20 1039    traMADol (ULTRAM) 50 MG tablet  Every 6 hours PRN     Discontinue  Reprint     05/25/20 1039    ondansetron (ZOFRAN) 8 MG tablet  Every 8 hours PRN     Discontinue  Reprint     05/25/20 1102           Note:  This document was prepared using Dragon voice recognition software and may include unintentional dictation errors.    Joni Reining, PA-C 05/25/20 1104    Sharman Cheek, MD 05/25/20 [phone]

## 2020-06-14 ENCOUNTER — Telehealth: Payer: Medicaid Other | Admitting: Internal Medicine

## 2020-06-19 ENCOUNTER — Encounter: Payer: Self-pay | Admitting: Emergency Medicine

## 2020-06-19 ENCOUNTER — Other Ambulatory Visit: Payer: Self-pay

## 2020-06-19 DIAGNOSIS — Z5321 Procedure and treatment not carried out due to patient leaving prior to being seen by health care provider: Secondary | ICD-10-CM | POA: Insufficient documentation

## 2020-06-19 DIAGNOSIS — K0889 Other specified disorders of teeth and supporting structures: Secondary | ICD-10-CM | POA: Insufficient documentation

## 2020-06-19 DIAGNOSIS — R002 Palpitations: Secondary | ICD-10-CM | POA: Diagnosis present

## 2020-06-19 LAB — CBC WITH DIFFERENTIAL/PLATELET
Abs Immature Granulocytes: 0.02 10*3/uL (ref 0.00–0.07)
Basophils Absolute: 0.1 10*3/uL (ref 0.0–0.1)
Basophils Relative: 0 %
Eosinophils Absolute: 0.7 10*3/uL — ABNORMAL HIGH (ref 0.0–0.5)
Eosinophils Relative: 6 %
HCT: 39.3 % (ref 36.0–46.0)
Hemoglobin: 13.2 g/dL (ref 12.0–15.0)
Immature Granulocytes: 0 %
Lymphocytes Relative: 31 %
Lymphs Abs: 3.6 10*3/uL (ref 0.7–4.0)
MCH: 27 pg (ref 26.0–34.0)
MCHC: 33.6 g/dL (ref 30.0–36.0)
MCV: 80.4 fL (ref 80.0–100.0)
Monocytes Absolute: 0.6 10*3/uL (ref 0.1–1.0)
Monocytes Relative: 5 %
Neutro Abs: 6.4 10*3/uL (ref 1.7–7.7)
Neutrophils Relative %: 58 %
Platelets: 225 10*3/uL (ref 150–400)
RBC: 4.89 MIL/uL (ref 3.87–5.11)
RDW: 13.9 % (ref 11.5–15.5)
WBC: 11.3 10*3/uL — ABNORMAL HIGH (ref 4.0–10.5)
nRBC: 0 % (ref 0.0–0.2)

## 2020-06-19 NOTE — ED Triage Notes (Signed)
Patient ambulatory to triage with steady gait, without difficulty or distress noted; pt reports "palpitations" since this morning; st recent rt lower dental pain as well

## 2020-06-20 ENCOUNTER — Emergency Department
Admission: EM | Admit: 2020-06-20 | Discharge: 2020-06-20 | Disposition: A | Discharge status: Home / Self Care | Payer: Medicaid Other | Attending: Emergency Medicine | Admitting: Emergency Medicine

## 2020-06-20 LAB — TROPONIN I (HIGH SENSITIVITY): Troponin I (High Sensitivity): <2 ng/L (ref <18)

## 2020-06-20 LAB — COMPREHENSIVE METABOLIC PANEL
ALT: 21 U/L (ref 0–44)
AST: 17 U/L (ref 15–41)
Albumin: 4.2 g/dL (ref 3.5–5.0)
Alkaline Phosphatase: 78 U/L (ref 38–126)
Anion gap: 8 (ref 5–15)
BUN: 14 mg/dL (ref 6–20)
CO2: 27 mmol/L (ref 22–32)
Calcium: 9 mg/dL (ref 8.9–10.3)
Chloride: 102 mmol/L (ref 98–111)
Creatinine, Ser: 0.75 mg/dL (ref 0.44–1.00)
GFR calc Af Amer: >60 mL/min (ref >60)
GFR calc non Af Amer: >60 mL/min (ref >60)
Glucose, Bld: 84 mg/dL (ref 70–99)
Potassium: 3.7 mmol/L (ref 3.5–5.1)
Sodium: 137 mmol/L (ref 135–145)
Total Bilirubin: 0.3 mg/dL (ref 0.3–1.2)
Total Protein: 7.9 g/dL (ref 6.5–8.1)

## 2020-06-20 NOTE — ED Notes (Signed)
Called several times from lobby & outside with no answer; attempted to call phone # listed--no answer

## 2020-06-21 ENCOUNTER — Encounter: Payer: Self-pay | Admitting: Family Medicine

## 2020-06-22 ENCOUNTER — Telehealth: Payer: Self-pay

## 2020-06-22 NOTE — Telephone Encounter (Signed)
She was evaluated at ED for palpitations. Wait was so long she only had labs done. She wants to know if her labs were normal. Concerned about WBC and why it is elevated.

## 2020-06-28 NOTE — Telephone Encounter (Signed)
Spoke with pt and scheduled her with Dr Dorris Fetch for next week.

## 2020-06-29 NOTE — Progress Notes (Deleted)
Patient is a 29 year old female patient of Dr. Elson Clan to the emergency room to be seen 06/20/2020 for palpitations This message followed:  She was evaluated at ED for palpitations. Wait was so long she only had labs done. She wants to know if her labs were normal. Concerned about WBC and why it is elevated. Was told she needed a follow-up visit, and was scheduled for this visit today. Of note, she has had 7 emergency room visits since January for various complaints. Last visit at [facility] was with Danelle Berry on 05/04/2020, a video visit.  Her labs from that emergency department visit dated 06/19/2020 included a normal complete metabolic panel, high sensitive troponin, with the complete blood count showing a slightly elevated WBC at 11.3 with no left shift and no anemia concerns

## 2020-07-03 ENCOUNTER — Ambulatory Visit: Payer: Medicaid Other | Admitting: Internal Medicine

## 2020-07-08 DIAGNOSIS — K0889 Other specified disorders of teeth and supporting structures: Secondary | ICD-10-CM | POA: Insufficient documentation

## 2020-07-08 DIAGNOSIS — R519 Headache, unspecified: Secondary | ICD-10-CM | POA: Insufficient documentation

## 2020-07-08 DIAGNOSIS — Z5321 Procedure and treatment not carried out due to patient leaving prior to being seen by health care provider: Secondary | ICD-10-CM | POA: Diagnosis not present

## 2020-07-08 NOTE — ED Triage Notes (Signed)
Patient to ED for headache and a toothache x 3 days.

## 2020-07-09 ENCOUNTER — Emergency Department
Admission: EM | Admit: 2020-07-09 | Discharge: 2020-07-09 | Disposition: A | Discharge status: Home / Self Care | Payer: Medicaid Other | Attending: Emergency Medicine | Admitting: Emergency Medicine

## 2020-07-09 NOTE — ED Notes (Signed)
Patient called for vitals did not answer 

## 2020-07-20 DIAGNOSIS — R519 Headache, unspecified: Secondary | ICD-10-CM | POA: Diagnosis not present

## 2020-07-20 DIAGNOSIS — Z79899 Other long term (current) drug therapy: Secondary | ICD-10-CM | POA: Insufficient documentation

## 2020-07-20 DIAGNOSIS — Z87891 Personal history of nicotine dependence: Secondary | ICD-10-CM | POA: Diagnosis not present

## 2020-07-20 DIAGNOSIS — F419 Anxiety disorder, unspecified: Secondary | ICD-10-CM | POA: Diagnosis not present

## 2020-07-21 ENCOUNTER — Emergency Department
Admission: EM | Admit: 2020-07-21 | Discharge: 2020-07-21 | Disposition: A | Discharge status: Home / Self Care | Payer: Medicaid Other | Attending: Emergency Medicine | Admitting: Emergency Medicine

## 2020-07-21 ENCOUNTER — Other Ambulatory Visit: Payer: Self-pay

## 2020-07-21 DIAGNOSIS — F419 Anxiety disorder, unspecified: Secondary | ICD-10-CM

## 2020-07-21 DIAGNOSIS — G8929 Other chronic pain: Secondary | ICD-10-CM

## 2020-07-21 MED ORDER — BUTALBITAL-APAP-CAFFEINE 50-325-40 MG PO TABS
1.0000 | ORAL_TABLET | Freq: Four times a day (QID) | ORAL | 0 refills | Status: DC | PRN
Start: 2020-07-21 — End: 2021-03-05

## 2020-07-21 NOTE — Discharge Instructions (Signed)
Your workup in the Emergency Department today was reassuring.  We did not find any specific abnormalities.  We recommend you drink plenty of fluids, take your regular medications and/or any new ones prescribed today, and follow up with the doctor(s) listed in these documents as recommended.  Return to the Emergency Department if you develop new or worsening symptoms that concern you.  

## 2020-07-21 NOTE — ED Provider Notes (Signed)
[facility] Emergency Department Provider Note  ____________________________________________   First MD Initiated Contact with Patient 07/21/20 [phone]     (approximate)  I have reviewed the triage vital signs and the nursing notes.   HISTORY  Chief Complaint Anxiety    HPI Lisa House is a 29 y.o. female with medical history as listed below who presents for evaluation of anxiety and concern over her medical record as well as with persistent headaches.  She says that she suffers from headaches regularly and has been seen for them in the past in the emergency department.  They are the worst when she is at work and they feel like a tight band around her head that resulted in generalized throbbing and severe pain.  Sometimes the light makes it worse.  She denies nausea and vomiting.  No pain in her neck.  No numbness nor weakness in any of her extremities.  Pain is similar to prior episodes and she has had no recent trauma.  The anxiety issue became a problem tonight when she said that she was reviewing her medical record and saw that she keeps having a an elevated white blood cell count.  She started to worry that she has a severe medical issue such as cancer and wanted to get it checked out to make sure that everything was okay.  She feels better now although she still has a headache.  She denies nausea, vomiting, chest pain, shortness of breath, and abdominal pain.  Nothing particular made her better or worse.        Past Medical History:  Diagnosis Date  . Allergy   . Depression   . Eczema   . GERD (gastroesophageal reflux disease)   . Intertrigo   . Obesity   . OCD (obsessive compulsive disorder)   . Panic attack     Patient Active Problem List   Diagnosis Date Noted  . Lumbar radiculopathy 04/27/2020  . Neck pain 02/14/2020  . Right ovarian cyst 08/16/2019  . Family history of congenital aortic stenosis 09/23/2017  . Herpes genitalis  02/08/2017  . IBS (irritable bowel syndrome) 05/09/2015  . Morbid obesity (HCC) 05/09/2015  . OCD (obsessive compulsive disorder) 05/09/2015  . Bilateral polycystic ovarian syndrome 05/09/2015  . Psoriasis 05/09/2015  . History of depression 05/09/2015  . Family history of congenital heart defect 08/08/2014  . Allergic rhinitis 05/02/2010    Past Surgical History:  Procedure Laterality Date  . CHOLECYSTECTOMY  01/09/2011   Dr. Excell Seltzer    Prior to Admission medications   Medication Sig Start Date End Date Taking? Authorizing Provider  albuterol (VENTOLIN HFA) 108 (90 Base) MCG/ACT inhaler Inhale 2 puffs into the lungs every 6 (six) hours as needed for wheezing or shortness of breath. 05/04/20   Danelle Berry, PA-C  albuterol (VENTOLIN HFA) 108 (90 Base) MCG/ACT inhaler Inhale 2 puffs into the lungs every 4 (four) hours as needed for wheezing or shortness of breath. 05/04/20   Danelle Berry, PA-C  butalbital-acetaminophen-caffeine (FIORICET) [phone] MG tablet Take 1 tablet by mouth every 6 (six) hours as needed for headache. 07/21/20 07/21/21  Loleta Rose, MD  cyclobenzaprine (FLEXERIL) 10 MG tablet Take 10 mg by mouth 3 (three) times daily as needed for muscle spasms.    [provider]  HYDROcodone-acetaminophen (NORCO/VICODIN) 5-325 MG tablet Take 1 tablet by mouth every 6 (six) hours as needed for moderate pain.    [provider]  levonorgestrel (MIRENA, 52 MG,) 20 MCG/24HR IUD  1 Intra Uterine Device (1 each total) by Intrauterine route once for 1 dose. 06/15/18 06/15/18  Alba Tanaysha Alkins, MD  ondansetron (ZOFRAN) 8 MG tablet Take 1 tablet (8 mg total) by mouth every 8 (eight) hours as needed for nausea or vomiting. 05/25/20   Joni Reining, PA-C  orphenadrine (NORFLEX) 100 MG tablet Take 1 tablet (100 mg total) by mouth 2 (two) times daily. 05/25/20   Joni Reining, PA-C  pimecrolimus (ELIDEL) 1 % cream APPLY EXTERNALLY TO THE AFFECTED AREA TWICE DAILY 05/12/19   Carlynn Purl,  Danna Hefty, MD  triamcinolone cream (KENALOG) 0.1 % APPLY EXTERNALLY TO THE AFFECTED AREA TWICE DAILY 04/26/20   Carlynn Purl, Danna Hefty, MD  valACYclovir (VALTREX) 500 MG tablet Take 1 tablet (500 mg total) by mouth 2 (two) times daily as needed. Prn outbreak x 3 days 08/16/19   Copland, Alicia B, PA-C    Allergies Tramadol  Family History  Problem Relation Age of Onset  . Hypertension Mother   . Diabetes Mother   . Headache Mother   . Hypertension Father   . Lung cancer Father   . Asthma Father   . Heart disease Son        Aortic Valve Disorder Stenosis-had balloon valvuloplasty, on meds  . Ovarian cancer Maternal Grandmother   . Colon cancer Maternal Grandfather     Social History Social History   Tobacco Use  . Smoking status: Former Games developer  . Smokeless tobacco: Never Used  Vaping Use  . Vaping Use: Never used  Substance Use Topics  . Alcohol use: No    Alcohol/week: 0.0 standard drinks  . Drug use: No    Review of Systems Constitutional: No fever/chills Eyes: No visual changes. ENT: No sore throat. Cardiovascular: Denies chest pain. Respiratory: Denies shortness of breath. Gastrointestinal: No abdominal pain.  No nausea, no vomiting.  No diarrhea.  No constipation. Genitourinary: Negative for dysuria. Musculoskeletal: Negative for neck pain.  Negative for back pain. Integumentary: Negative for rash. Neurological: Negative for headaches, focal weakness or numbness. Psychiatric:  Anxiety  ____________________________________________   PHYSICAL EXAM:  VITAL SIGNS: ED Triage Vitals  Enc Vitals Group     BP 07/20/20 2348 124/64     Pulse Rate 07/20/20 2348 98     Resp 07/20/20 2348 18     Temp 07/20/20 2348 98.7 F (37.1 C)     Temp Source 07/20/20 2348 Oral     SpO2 07/20/20 2348 99 %     Weight 07/20/20 2348 114.3 kg (252 lb)     Height 07/20/20 2348 1.651 m (5\' 5" )     Head Circumference --      Peak Flow --      Pain Score 07/21/20 0003 6     Pain Loc --        Pain Edu? --      Excl. in GC? --     Constitutional: Alert and oriented.  Eyes: Conjunctivae are normal.  Pupils are equal and reactive bilaterally. Head: Atraumatic. Nose: No congestion/rhinnorhea. Mouth/Throat: Patient is wearing a mask. Neck: No stridor.  No meningeal signs.   Cardiovascular: Normal rate, regular rhythm.  Respiratory: Normal respiratory effort.  No retractions. Musculoskeletal: No gross deformities of extremities. Neurologic:  Normal speech and language. No gross focal neurologic deficits are appreciated.  Skin:  Skin is warm, dry and intact. Psychiatric: Mood and affect are anxious and worried but generally appropriate under the circumstances.  ____________________________________________   LABS (all labs ordered are listed, but  only abnormal results are displayed)  Labs Reviewed - No data to display ____________________________________________  EKG  No indication for emergent EKG ____________________________________________  RADIOLOGY I, Loleta Roseory Roselynn Whitacre, personally viewed and evaluated these images (plain radiographs) as part of my medical decision making, as well as reviewing the written report by the radiologist.  ED MD interpretation: No indication for emergent imaging  Official radiology report(s): No results found.  ____________________________________________   PROCEDURES   Procedure(s) performed (including Critical Care):  Procedures   ____________________________________________   INITIAL IMPRESSION / MDM / ASSESSMENT AND PLAN / ED COURSE  As part of my medical decision making, I reviewed the following data within the electronic MEDICAL RECORD NUMBER Nursing notes reviewed and incorporated, Labs reviewed , Old chart reviewed, Notes from prior ED visits and [place] Controlled Substance Database   Differential diagnosis includes, but is not limited to, anxiety, tension headache, migraine headache, headache not otherwise specified.  The  patient has had similar headaches in the past and it is in fact a chronic condition for her.  She had good results one prior time that she was here when she got a migraine cocktail, but the symptoms she is describing sound more consistent with a tension headache.  She has no focal neurological deficits and an overall reassuring physical exam and work-up tonight.  I explained to her that her lab work is all reassuring and that a very mild leukocytosis like she was seeing on her results is not indicative of acute infection, cancer, or any other emergent medical condition.  I explained that her labs and prior visits are reviewed each time she comes in and she has been seen 7 times now in the last 6 months.  I strongly encouraged her to continue following up with her established primary care doctor who knows her the best and can work with her on some of these issues.  I offered to provide an IV "migraine cocktail" like she received last time or if she would prefer to go home and follow-up as an outpatient, we can do that as well (she was sleeping when her nurse Tobi Bastosnna and I came in the room to assess her).  She says she would prefer to go home which I think is appropriate.  I am giving her a prescription for Fioricet to see if it will help her during the day when she is experiencing her headaches and I gave my usual and customary return precautions.           ____________________________________________  FINAL CLINICAL IMPRESSION(S) / ED DIAGNOSES  Final diagnoses:  Anxiety  Chronic nonintractable headache, unspecified headache type     MEDICATIONS GIVEN DURING THIS VISIT:  Medications - No data to display   ED Discharge Orders         Ordered    butalbital-acetaminophen-caffeine (FIORICET) 50-325-40 MG tablet  Every 6 hours PRN        07/21/20 0443          *Please note:  Romilda JoyKristin L Tankard was evaluated in Emergency Department on 07/21/2020 for the symptoms described in the history of  present illness. She was evaluated in the context of the global COVID-19 pandemic, which necessitated consideration that the patient might be at risk for infection with the SARS-CoV-2 virus that causes COVID-19. Institutional protocols and algorithms that pertain to the evaluation of patients at risk for COVID-19 are in a state of rapid change based on information released by regulatory bodies including the CDC and federal and  state organizations. These policies and algorithms were followed during the patient's care in the ED.  Some ED evaluations and interventions may be delayed as a result of limited staffing during and after the pandemic.*  Note:  This document was prepared using Dragon voice recognition software and may include unintentional dictation errors.   Loleta Rose, MD 07/21/20 [phone]

## 2020-07-21 NOTE — ED Triage Notes (Signed)
Pt presents via POV c/o anxiety. Reports looking through medical record made her anxious. Reports headache x4 days.

## 2020-07-21 NOTE — ED Notes (Signed)
Patient verbalizes understanding of discharge instructions. Opportunity for questioning and answers were provided. Armband removed by staff, pt discharged from ED. Ambulated out to lobby  

## 2020-07-25 IMAGING — US US PELVIS COMPLETE TRANSABD/TRANSVAG W DUPLEX
1 series · 13 of 25 positions shown · non-contrast
Comparison: None.

CLINICAL DATA: Lower abdominal pain for 5 days

EXAM:
TRANSABDOMINAL AND TRANSVAGINAL ULTRASOUND OF PELVIS
DOPPLER ULTRASOUND OF OVARIES
TECHNIQUE: Both transabdominal and transvaginal ultrasound examinations of the
pelvis were performed. Transabdominal technique was performed for
global imaging of the pelvis including uterus, ovaries, adnexal
regions, and pelvic cul-de-sac.
It was necessary to proceed with endovaginal exam following the
transabdominal exam to visualize the uterus endometrium ovaries.
Color and duplex Doppler ultrasound was utilized to evaluate blood
flow to the ovaries.

[Series 1: us pelvis complete transabd/transvag w duplex · 13 of 121 slices shown]
[im 1/121]
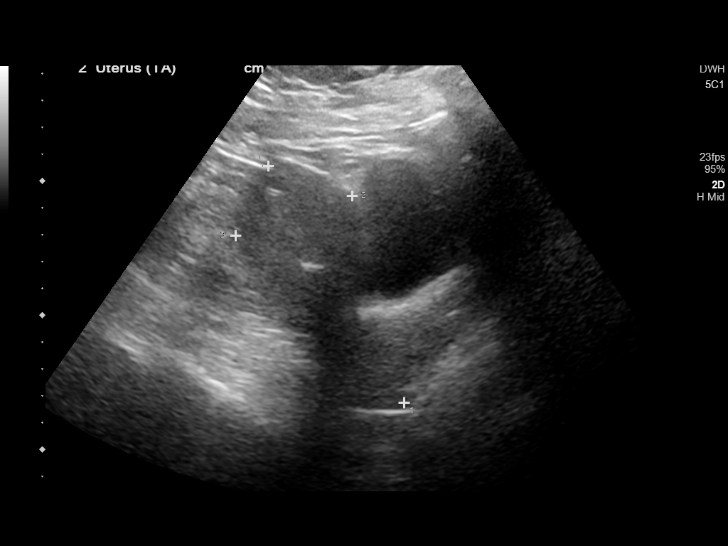
[im 11/121]
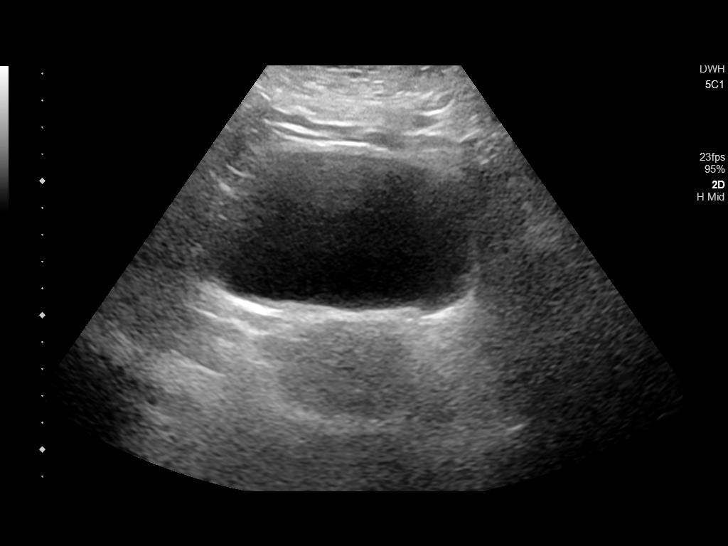
[im 21/121]
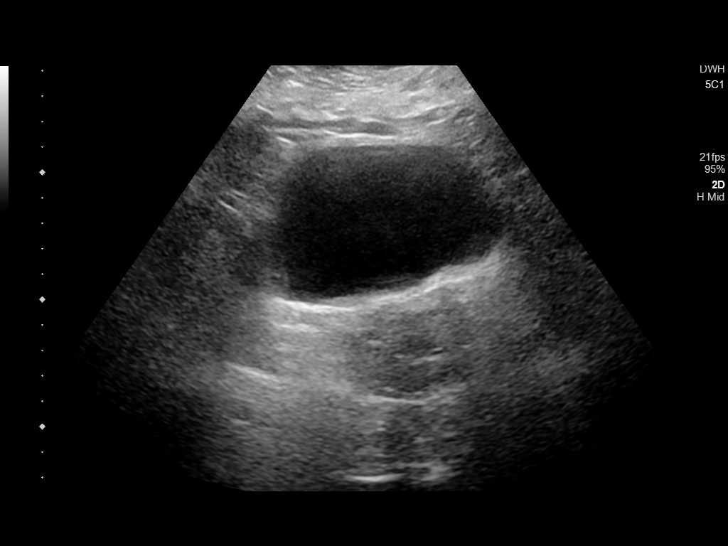
[im 31/121]
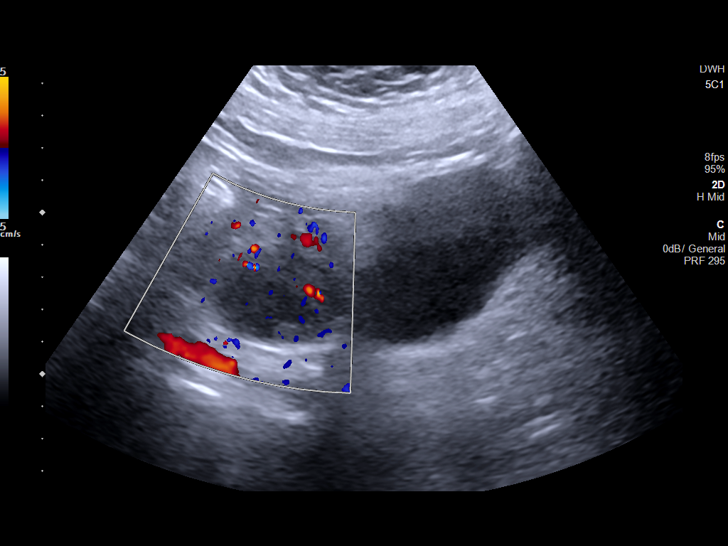
[im 41/121]
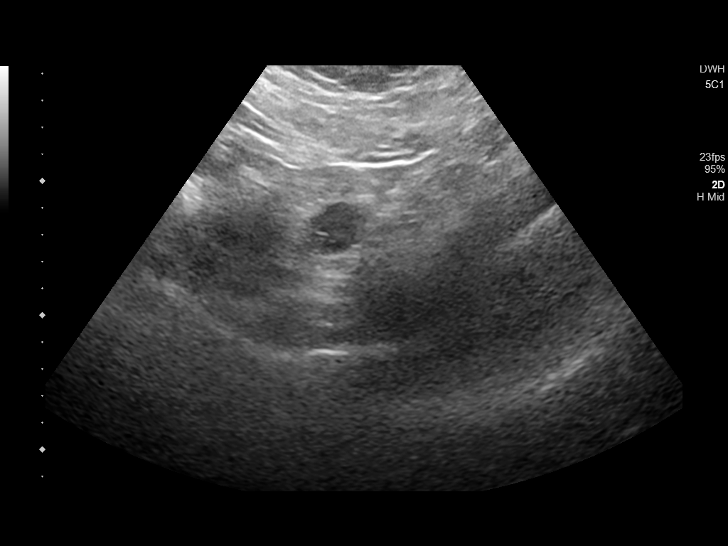
[im 51/121]
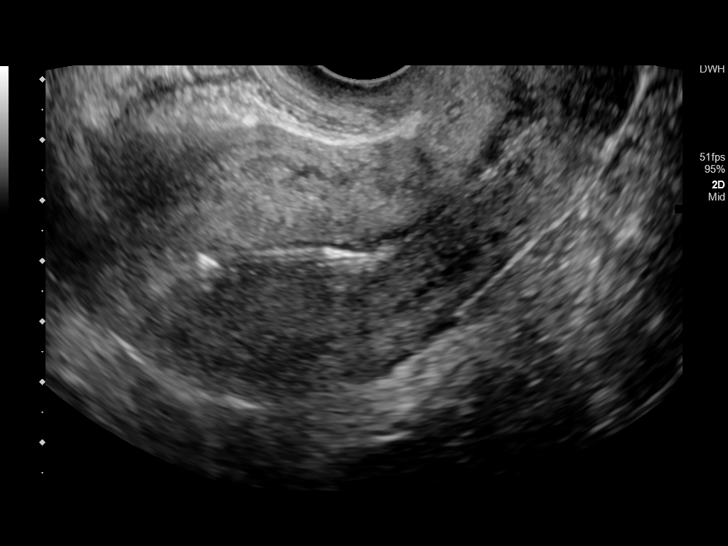
[im 61/121]
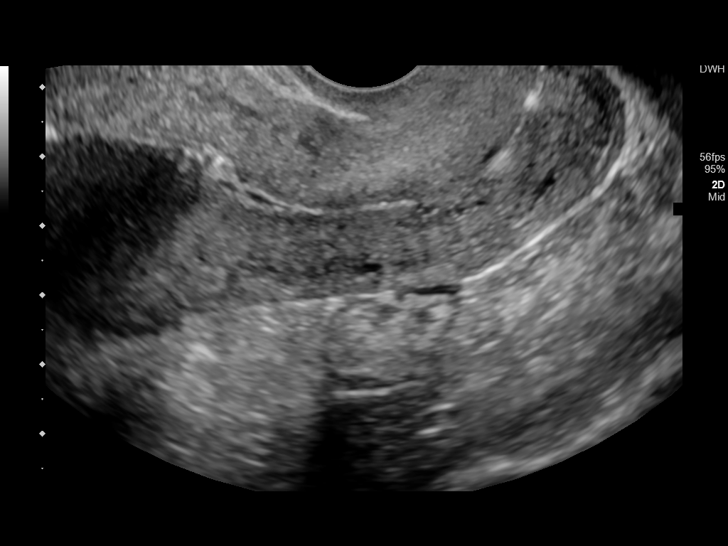
[im 71/121]
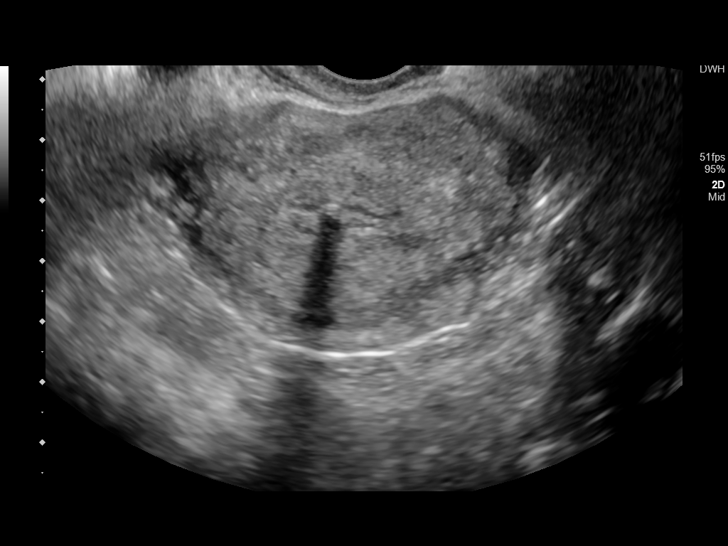
[im 81/121]
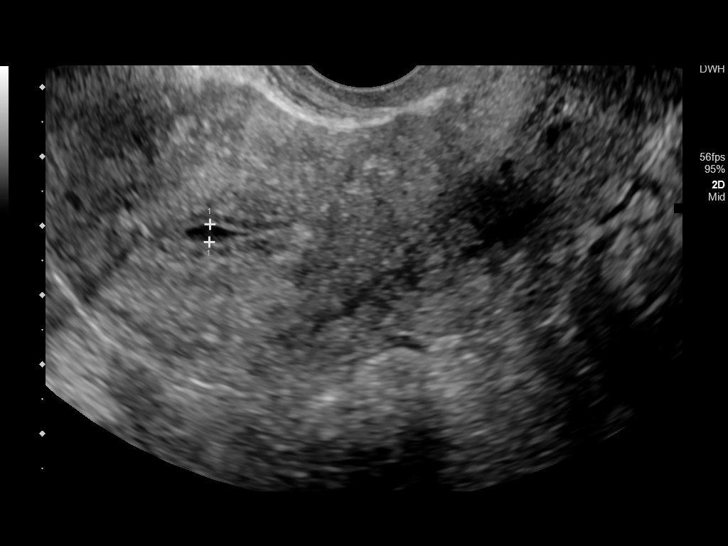
[im 91/121]
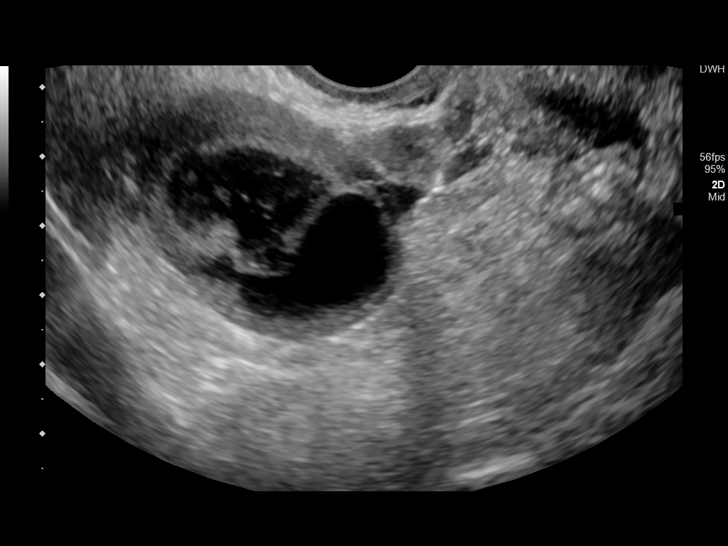
[im 101/121]
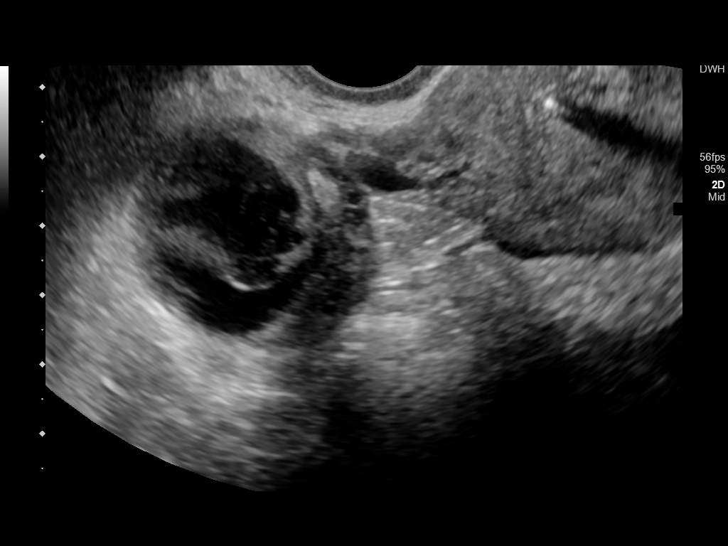
[im 111/121]
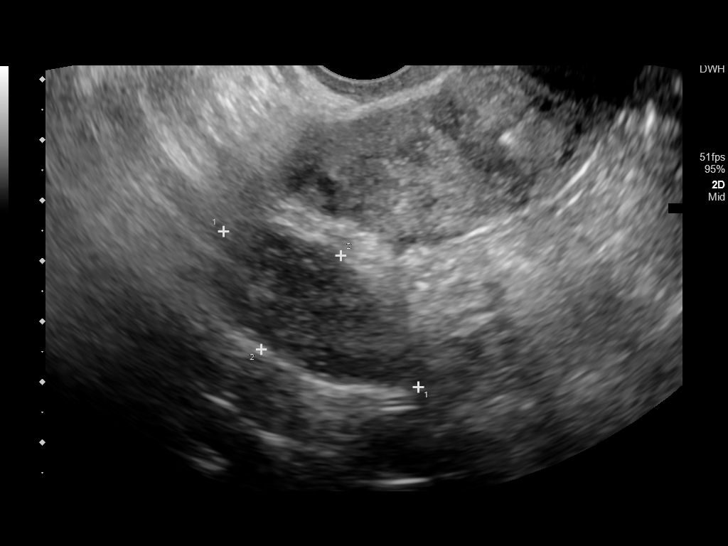
[im 121/121]
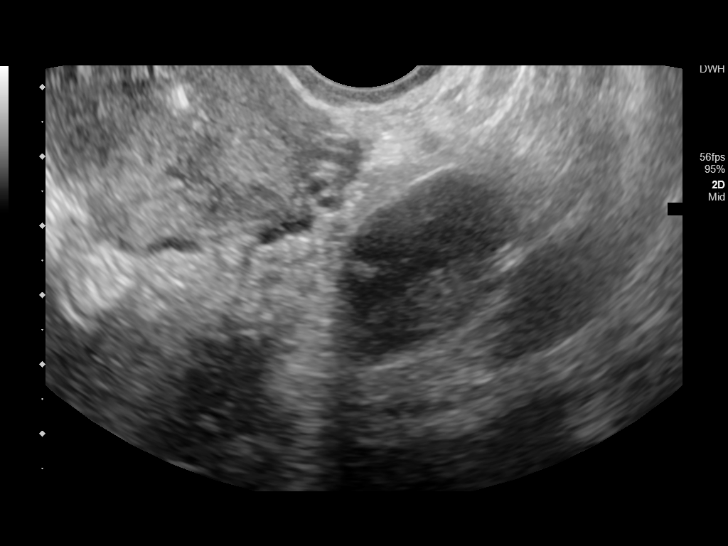

[13 of 25 positions shown; findings below may reference images not displayed]

FINDINGS: Uterus

Measurements: 10.2 x 4.6 x 5.5 cm = volume: 134.8 mL. No fibroids or
other mass visualized.

Endometrium

Thickness: 5.7 mm. Trace fluid in the endometrial canal. IUD within
the endometrium.

Right ovary

Measurements: 4.2 x 3 x 3.6 cm = volume: 23.4 mL. Complex cystic
mass measuring 3.5 x 2.5 x 2.4 cm, mass contains slightly thickened
internal septa but no appreciable flow. Adjacent small echogenic
mass measuring 7 mm.

Left ovary

Measurements: 4.1 x 2 x 2.2 cm = volume: 9.5 mL. Normal
appearance/no adnexal mass.

Pulsed Doppler evaluation of both ovaries demonstrates normal
low-resistance arterial and venous waveforms.

Other findings

No abnormal free fluid.
IMPRESSION: 1. Negative for ovarian torsion.
2. 3.5 cm complex cystic mass in the right ovary, questionable for
hemorrhagic corpus luteum, with adjacent smaller echogenic 7 mm
mass. Ultrasound follow-up in 6-12 weeks is recommended to ensure
resolution.

## 2020-08-25 ENCOUNTER — Other Ambulatory Visit: Payer: Self-pay

## 2020-08-25 ENCOUNTER — Emergency Department
Admission: EM | Admit: 2020-08-25 | Discharge: 2020-08-25 | Disposition: A | Discharge status: Home / Self Care | Payer: Medicaid Other | Attending: Emergency Medicine | Admitting: Emergency Medicine

## 2020-08-25 ENCOUNTER — Encounter: Payer: Self-pay | Admitting: Emergency Medicine

## 2020-08-25 DIAGNOSIS — K029 Dental caries, unspecified: Secondary | ICD-10-CM

## 2020-08-25 DIAGNOSIS — K047 Periapical abscess without sinus: Secondary | ICD-10-CM | POA: Diagnosis not present

## 2020-08-25 DIAGNOSIS — Z87891 Personal history of nicotine dependence: Secondary | ICD-10-CM | POA: Insufficient documentation

## 2020-08-25 DIAGNOSIS — K0889 Other specified disorders of teeth and supporting structures: Secondary | ICD-10-CM | POA: Diagnosis present

## 2020-08-25 MED ORDER — LIDOCAINE VISCOUS HCL 2 % MT SOLN
15.0000 mL | Freq: Once | OROMUCOSAL | Status: AC
  Administered 2020-08-25: 15 mL via OROMUCOSAL
  Filled 2020-08-25: qty 15

## 2020-08-25 MED ORDER — AMOXICILLIN 500 MG PO CAPS
500.0000 mg | ORAL_CAPSULE | Freq: Once | ORAL | Status: AC
  Administered 2020-08-25: 500 mg via ORAL
  Filled 2020-08-25: qty 1

## 2020-08-25 MED ORDER — AMOXICILLIN 500 MG PO CAPS: 500.0000 mg | ORAL_CAPSULE | Freq: Three times a day (TID) | ORAL | 0 refills | Status: DC

## 2020-08-25 MED ORDER — LIDOCAINE VISCOUS HCL 2 % MT SOLN: 15.0000 mL | OROMUCOSAL | 0 refills | Status: DC | PRN

## 2020-08-25 NOTE — ED Triage Notes (Signed)
Pt presents to ED via POV with c/o R lower teeth, pt states she thinks she has a dental abscess due to the tooth leaking and pain.

## 2020-08-25 NOTE — ED Provider Notes (Signed)
[facility] Emergency Department Provider Note ____________________________________________  Time seen: 1741  I have reviewed the triage vital signs and the nursing notes.  HISTORY  Chief Complaint  Dental Pain   HPI Lisa House is a 29 y.o. female presents as up to the ED for evaluation of right lower dental pain.  Patient believes she may have a dental abscess secondary to the tooth having purulent discharge, and localized pain.  She admits the right second molar has a cavity defect on the cheek side. She applied a temporary packing a few days earlier, and noted purulent drainage and gumline swelling. She denies any difficulty eating, drinking, swallowing, or controlling oral secretions. She reports she is scheduled to see a dental provider next week.   Past Medical History:  Diagnosis Date  . Allergy   . Depression   . Eczema   . GERD (gastroesophageal reflux disease)   . Intertrigo   . Obesity   . OCD (obsessive compulsive disorder)   . Panic attack     Patient Active Problem List   Diagnosis Date Noted  . Lumbar radiculopathy 04/27/2020  . Neck pain 02/14/2020  . Right ovarian cyst 08/16/2019  . Family history of congenital aortic stenosis 09/23/2017  . Herpes genitalis 02/08/2017  . IBS (irritable bowel syndrome) 05/09/2015  . Morbid obesity (HCC) 05/09/2015  . OCD (obsessive compulsive disorder) 05/09/2015  . Bilateral polycystic ovarian syndrome 05/09/2015  . Psoriasis 05/09/2015  . History of depression 05/09/2015  . Family history of congenital heart defect 08/08/2014  . Allergic rhinitis 05/02/2010    Past Surgical History:  Procedure Laterality Date  . CHOLECYSTECTOMY  01/09/2011   Dr. Excell Seltzer    Prior to Admission medications   Medication Sig Start Date End Date Taking? Authorizing Provider  albuterol (VENTOLIN HFA) 108 (90 Base) MCG/ACT inhaler Inhale 2 puffs into the lungs every 6 (six) hours as needed for wheezing or  shortness of breath. 05/04/20   Danelle Berry, PA-C  albuterol (VENTOLIN HFA) 108 (90 Base) MCG/ACT inhaler Inhale 2 puffs into the lungs every 4 (four) hours as needed for wheezing or shortness of breath. 05/04/20   Danelle Berry, PA-C  amoxicillin (AMOXIL) 500 MG capsule Take 1 capsule (500 mg total) by mouth 3 (three) times daily. 08/25/20   Talajah Slimp, Charlesetta Ivory, PA-C  butalbital-acetaminophen-caffeine (FIORICET) [phone] MG tablet Take 1 tablet by mouth every 6 (six) hours as needed for headache. 07/21/20 07/21/21  Loleta Rose, MD  cyclobenzaprine (FLEXERIL) 10 MG tablet Take 10 mg by mouth 3 (three) times daily as needed for muscle spasms.    [provider]  HYDROcodone-acetaminophen (NORCO/VICODIN) 5-325 MG tablet Take 1 tablet by mouth every 6 (six) hours as needed for moderate pain.    [provider]  levonorgestrel (MIRENA, 52 MG,) 20 MCG/24HR IUD 1 Intra Uterine Device (1 each total) by Intrauterine route once for 1 dose. 06/15/18 06/15/18  [name], MD  lidocaine (XYLOCAINE) 2 % solution Use as directed 15 mLs in the mouth or throat every 4 (four) hours as needed for mouth pain. 08/25/20   Davonda Ausley, Charlesetta Ivory, PA-C  ondansetron (ZOFRAN) 8 MG tablet Take 1 tablet (8 mg total) by mouth every 8 (eight) hours as needed for nausea or vomiting. 05/25/20   Joni Reining, PA-C  orphenadrine (NORFLEX) 100 MG tablet Take 1 tablet (100 mg total) by mouth 2 (two) times daily. 05/25/20   Joni Reining, PA-C  pimecrolimus (ELIDEL) 1 % cream  APPLY EXTERNALLY TO THE AFFECTED AREA TWICE DAILY 05/12/19   Carlynn Purl, Danna Hefty, MD  triamcinolone cream (KENALOG) 0.1 % APPLY EXTERNALLY TO THE AFFECTED AREA TWICE DAILY 04/26/20   Carlynn Purl, Danna Hefty, MD  valACYclovir (VALTREX) 500 MG tablet Take 1 tablet (500 mg total) by mouth 2 (two) times daily as needed. Prn outbreak x 3 days 08/16/19   Copland, Ilona Sorrel, PA-C    Allergies Patient has no active allergies.  Family History  Problem Relation  Age of Onset  . Hypertension Mother   . Diabetes Mother   . Headache Mother   . Hypertension Father   . Lung cancer Father   . Asthma Father   . Heart disease Son        Aortic Valve Disorder Stenosis-had balloon valvuloplasty, on meds  . Ovarian cancer Maternal Grandmother   . Colon cancer Maternal Grandfather     Social History Social History   Tobacco Use  . Smoking status: Former Games developer  . Smokeless tobacco: Never Used  Vaping Use  . Vaping Use: Never used  Substance Use Topics  . Alcohol use: No    Alcohol/week: 0.0 standard drinks  . Drug use: No    Review of Systems  Constitutional: Negative for fever. Eyes: Negative for visual changes. ENT: Negative for sore throat.  Dental pain as above. Cardiovascular: Negative for chest pain. Respiratory: Negative for shortness of breath. Gastrointestinal: Negative for abdominal pain, vomiting and diarrhea. Musculoskeletal: Negative for back pain. Skin: Negative for rash. Neurological: Negative for headaches, focal weakness or numbness. ____________________________________________  PHYSICAL EXAM:  VITAL SIGNS: ED Triage Vitals  Enc Vitals Group     BP 08/25/20 1657 140/82     Pulse Rate 08/25/20 1657 80     Resp 08/25/20 1657 16     Temp 08/25/20 1657 97.7 F (36.5 C)     Temp Source 08/25/20 1657 Oral     SpO2 08/25/20 1657 100 %     Weight 08/25/20 1654 251 lb 15.8 oz (114.3 kg)     Height 08/25/20 1654 5\' 5"  (1.651 m)     Head Circumference --      Peak Flow --      Pain Score 08/25/20 1654 6     Pain Loc --      Pain Edu? --      Excl. in GC? --     Constitutional: Alert and oriented. Well appearing and in no distress. Head: Normocephalic and atraumatic. Eyes: Conjunctivae are normal. PERRL. Normal extraocular movements Nose: No congestion/rhinorrhea/epistaxis. Mouth/Throat: Mucous membranes are moist.  Uvula is midline tonsils are flat.  No oropharyngeal lesions are appreciated.  Patient with pain  still with right lower gumline at the second molar.  A buccal side The defect in the tooth is noted.  The third molar is partially erupted with some overlying gum swelling noted.  No brawny sublingual edema is appreciated. Neck: Supple. No thyromegaly. Hematological/Lymphatic/Immunological: No cervical lymphadenopathy. Cardiovascular: Normal rate, regular rhythm. Normal distal pulses. Respiratory: Normal respiratory effort. No wheezes/rales/rhonchi. Gastrointestinal: Soft and nontender. No distention. ____________________________________________  PROCEDURES  Amoxicillin 500 mg p.o. Viscous lidocaine 2% apply topically  Procedures ____________________________________________  INITIAL IMPRESSION / ASSESSMENT AND PLAN / ED COURSE  Patient with ED evaluation of acute right-sided lower dental pain secondary to dental cavity and suspected infection.  Patient will be treated empirically for her dental infection.  Resolved on amoxicillin to take as directed.  Treatment for this locking is also provided for topical pain relief.  She will follow-up with a dental provider as scheduled for next week for definitive management peer return precautions have been discussed.  Lisa House was evaluated in Emergency Department on 08/25/2020 for the symptoms described in the history of present illness. She was evaluated in the context of the global COVID-19 pandemic, which necessitated consideration that the patient might be at risk for infection with the SARS-CoV-2 virus that causes COVID-19. Institutional protocols and algorithms that pertain to the evaluation of patients at risk for COVID-19 are in a state of rapid change based on information released by regulatory bodies including the CDC and federal and state organizations. These policies and algorithms were followed during the patient's care in the ED. ____________________________________________  FINAL CLINICAL IMPRESSION(S) / ED DIAGNOSES  Final  diagnoses:  Pain due to dental caries  Dental infection      Lissa Hoard, PA-C 08/25/20 1807    Merwyn Katos, MD 08/25/20 1910

## 2020-08-25 NOTE — Discharge Instructions (Signed)
Take antibiotic as directed and the lidocaine gel as needed.  Follow-up with your dental provider or one of the clinics listed on the list below.  OPTIONS FOR DENTAL FOLLOW UP CARE  [place] [facility] - Local Safety Net Dental Clinics [email or web address]   [facility] [phone])  [facility] [phone])  [facility] [phone])  [facility] [phone])  [facility] [phone]) This clinic caters to the indigent population and is on a lottery system. Location: [employer] [facility], [employer], [address] [place] Clinic Hours: Wednesdays from 6pm - 9pm, patients seen by a lottery system. For dates, call or go to [email or web address] Services: Cleanings, fillings and simple extractions. Payment Options: DENTAL WORK IS FREE OF CHARGE. Bring proof of income or support. Best way to get seen: Arrive at 5:15 pm - this is a lottery, NOT first come/first serve, so arriving earlier will not increase your chances of being seen.     [facility] [phone] Select option 1 for emergencies   Location: [facility], [place], [address], [place] Clinic Hours: No walk-ins accepted - call the day before to schedule an appointment. Check in times are 9:30 am and 1:30 pm. Services: Simple extractions, temporary fillings, pulpectomy/pulp debridement, uncomplicated abscess drainage. Payment Options: PAYMENT IS DUE AT THE TIME OF SERVICE.  Fee is usually $100-200, additional surgical procedures (e.g. abscess drainage) may be extra. Cash, checks, Visa/MasterCard accepted.  Can file Medicaid if patient is covered for dental - patient should call case worker to check. No discount for [facility] patients. Best way to get seen: MUST call the day before  and get onto the schedule. Can usually be seen the next 1-2 days. No walk-ins accepted.     [facility] [phone]   Location: [facility], [address] [place] Clinic Hours: M, W, Th, F 8am or 1:30pm, Tues 9a or 1:30 - first come/first served. Services: Simple extractions, temporary fillings, uncomplicated abscess drainage.  You do not need to be an [facility] resident. Payment Options: PAYMENT IS DUE AT THE TIME OF SERVICE. Dental insurance, otherwise sliding scale - bring proof of income or support. Depending on income and treatment needed, cost is usually $50-200. Best way to get seen: Arrive early as it is first come/first served.     [facility] [phone]   Location: [address] Clinic Hours: Mon-Thu 8a-5p Services: Most basic dental services including extractions and fillings. Payment Options: PAYMENT IS DUE AT THE TIME OF SERVICE. Sliding scale, up to 50% off - bring proof if income or support. Medicaid with dental option accepted. Best way to get seen: Call to schedule an appointment, can usually be seen within 2 weeks OR they will try to see walk-ins - show up at 8a or 2p (you may have to wait).     [facility] [phone] [place] RESIDENTS ONLY   Location: [facility], [address], [place], [place] [address] Clinic Hours: By appointment only. Monday - Thursday 8am-5pm, Friday 8am-12pm Services: Cleanings, fillings, extractions. Payment Options: PAYMENT IS DUE AT THE TIME OF SERVICE. Cash, Visa or MasterCard. Sliding scale - $30 minimum per service. Best way to get seen: Come in to office, complete packet and make an appointment - need proof of income or support monies for each household member and proof of  [facility] residence. Usually takes about a month to get in.     [facility] [phone]   Location: [address], [place] Clinic Hours: Walk-in Urgent Care Dental Services are offered Monday-Friday mornings only. The numbers of emergencies accepted daily is limited to the number of providers available. Maximum 15 - Mondays, Wednesdays & Thursdays Maximum 10 - Tuesdays & Fridays Services: You do not need to be a [facility] resident to be seen for a dental emergency. Emergencies are defined as pain, swelling, abnormal bleeding, or dental trauma. Walkins will receive x-rays if needed. NOTE: Dental cleaning is not an emergency. Payment Options: PAYMENT IS DUE AT THE TIME OF SERVICE. Minimum co-pay is $40.00 for uninsured patients. Minimum co-pay is $3.00 for Medicaid with dental coverage. Dental Insurance is accepted and must be presented at time of visit. Medicare does not cover dental. Forms of payment: Cash, credit card, checks. Best way to get seen: If not previously registered with the clinic, walk-in dental registration begins at 7:15 am and is on a first come/first serve basis. If previously registered with the clinic, call to make an appointment.     [facility] [place] ONLY   Location: [address] [place], [place] Clinic Hours: Mon-Thu 10a-2p Services: Extractions only! Payment Options: FREE (donations accepted) - bring proof of income or support Best way to get seen: Call and schedule an appointment OR come at 8am on the 1st Monday of every month (except for holidays) when it is first come/first served.     [facility] [phone]   Location: [place], [place] Clinic Hours: Friday mornings Services, Payment Options, Best way to get seen: Call for info

## 2020-08-25 NOTE — ED Notes (Addendum)
Pt c/o dental pain since yesterday in lower right wisdom tooth and cracked molar. Pt states she wear a cover on the cracked tooth, had attempted to see dentist but was removed as a pt there. Pt states when she wipes the tooth a "stringy pink tinge" comes off.

## 2020-12-01 IMAGING — DX DG FOOT COMPLETE 3+V*R*
3 series · 3 of 3 positions shown · non-contrast
Comparison: 11/25/2012

CLINICAL DATA: Right foot injury, swelling

EXAM:
RIGHT FOOT COMPLETE - 3+ VIEW

[foot ap]
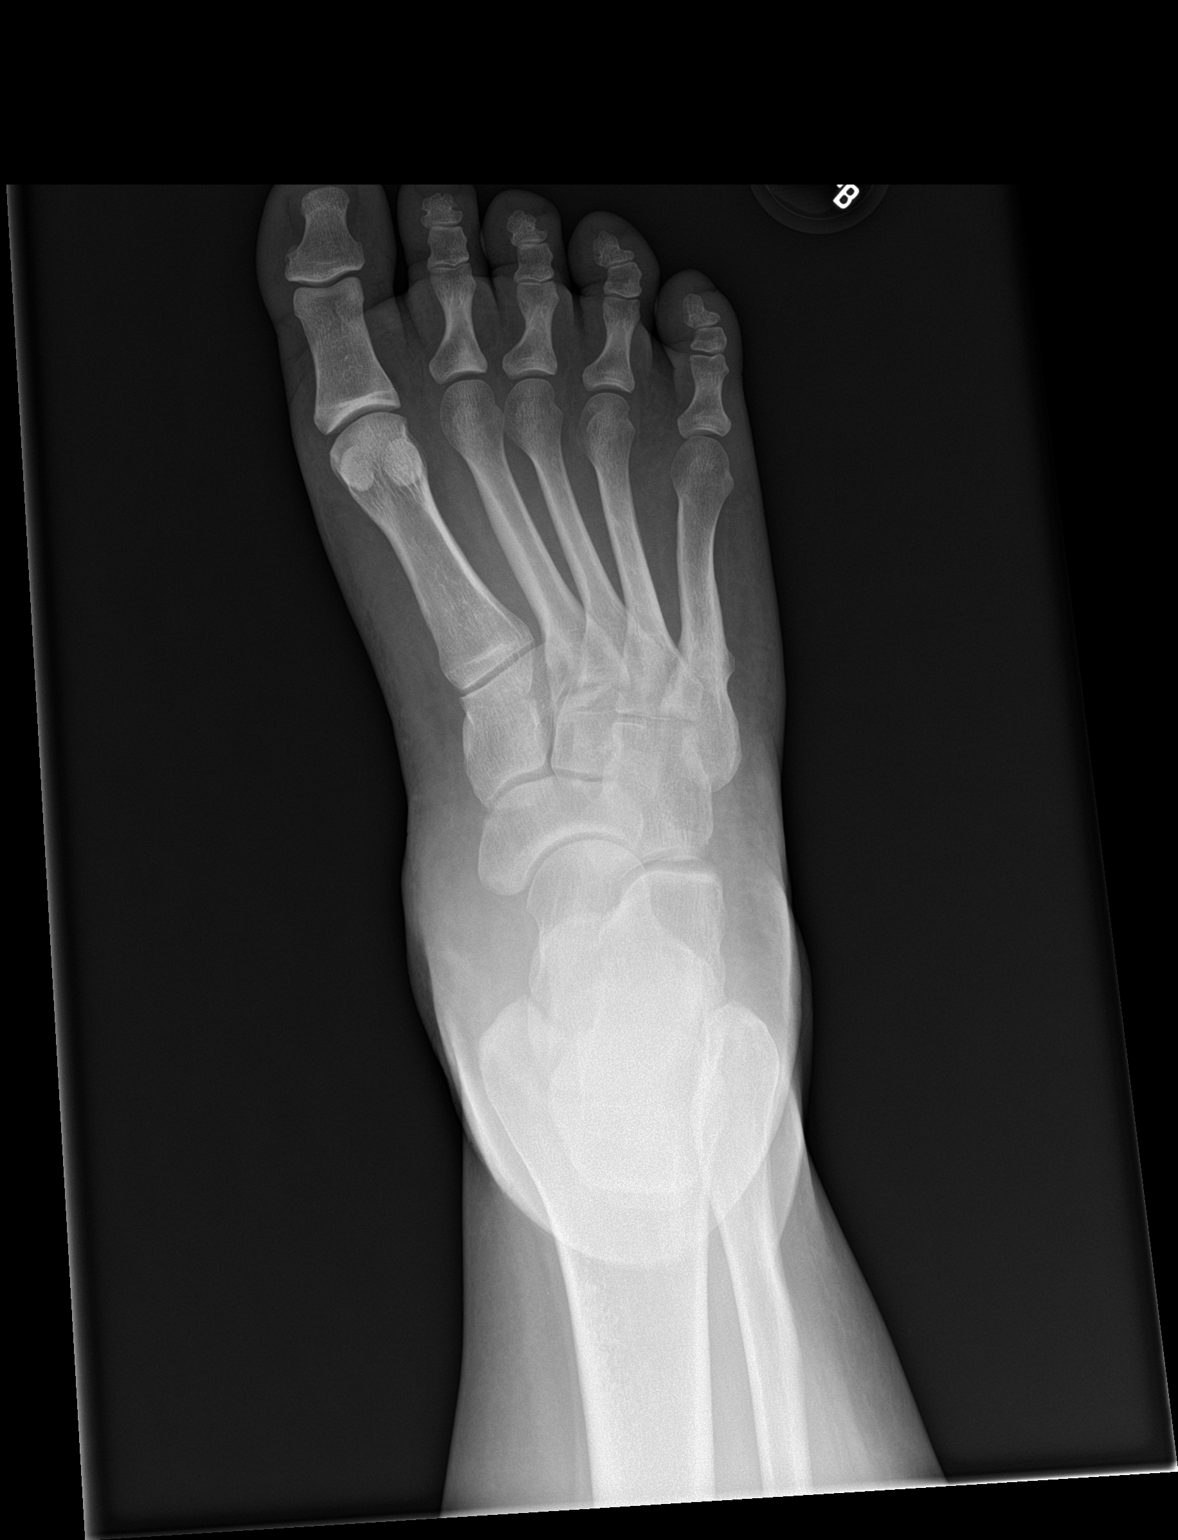

[foot obl]
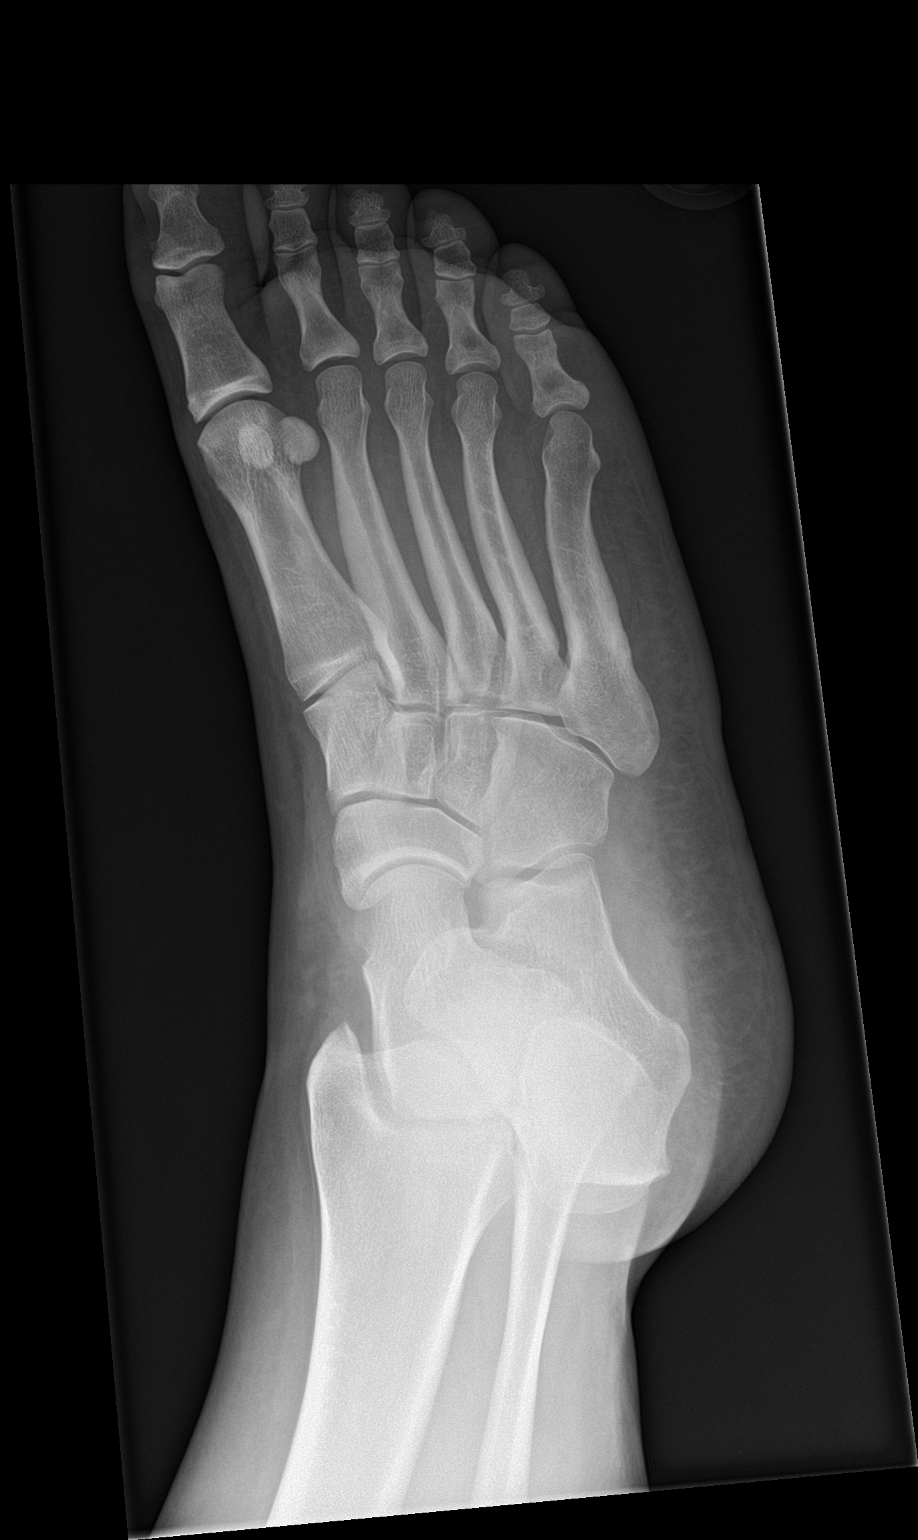

[foot lat]
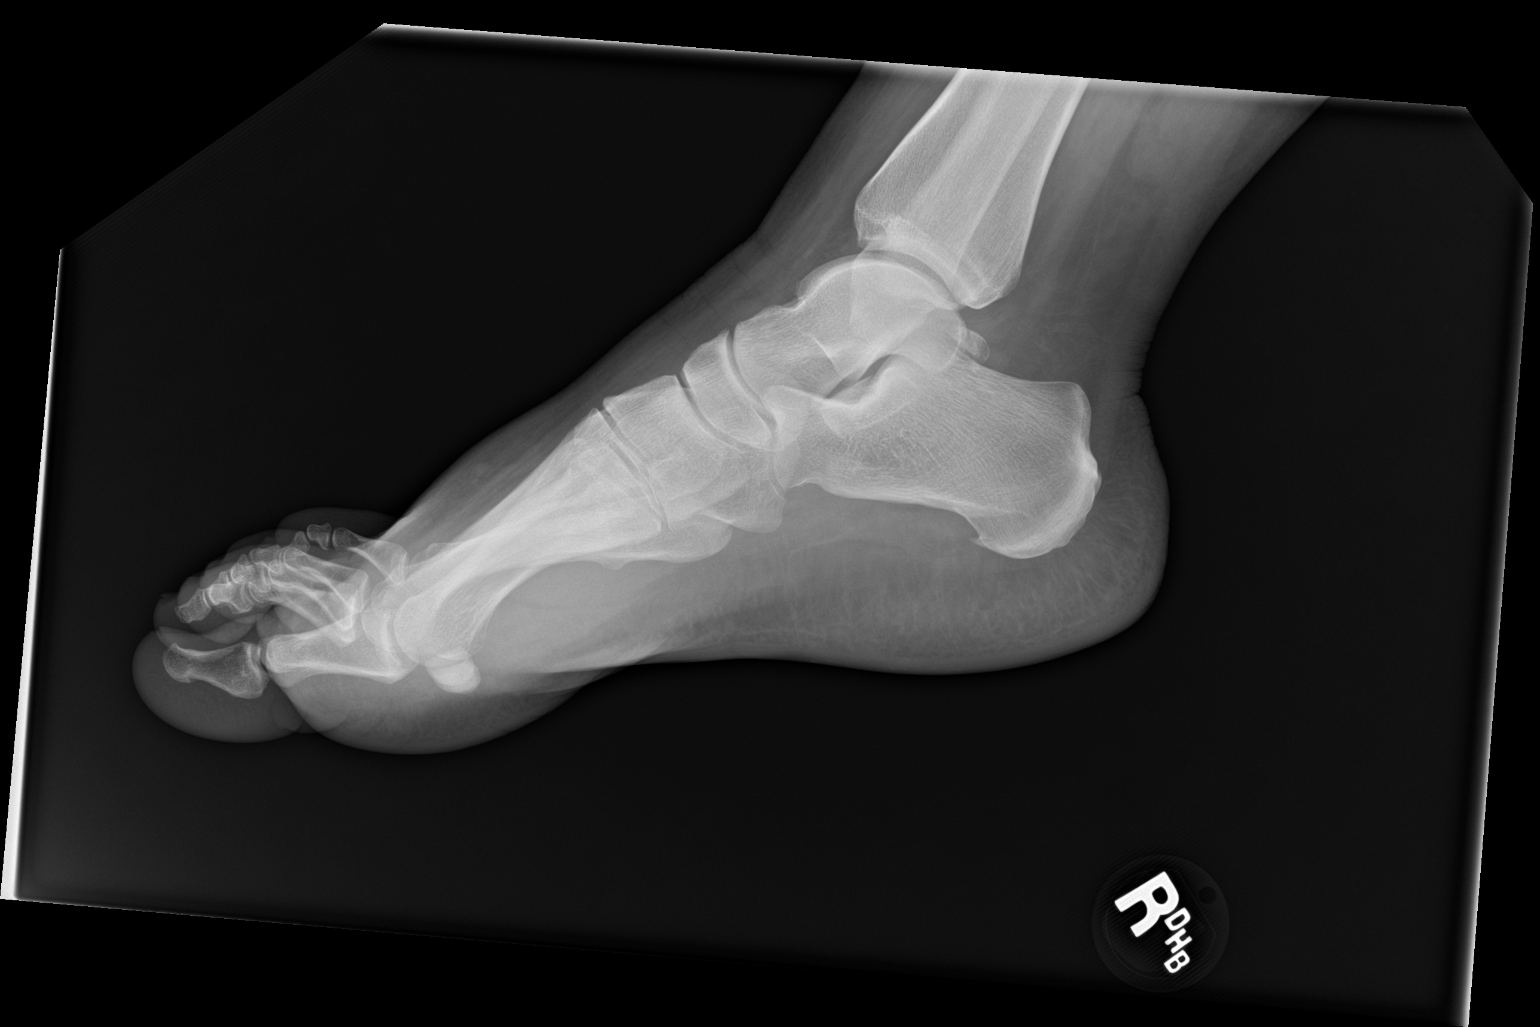

[3 of 3 positions shown; findings below may reference images not displayed]

FINDINGS: Frontal, oblique, and lateral views of the right foot are obtained.
There is a prior healed fifth metatarsal fracture. No acute bony
abnormality. Joint spaces are well preserved. Mild soft tissue
swelling throughout the forefoot.
IMPRESSION: 1. No acute displaced fracture.
2. Soft tissue swelling of the forefoot.

## 2020-12-04 NOTE — Progress Notes (Deleted)
Name: Lisa House   MRN: [id number]    DOB: [birth year]   Date:12/04/2020       Progress Note  Subjective  Chief Complaint  Acute visit: Vaginal spotting/discharge  HPI *** Patient Active Problem List   Diagnosis Date Noted  . Lumbar radiculopathy 04/27/2020  . Neck pain 02/14/2020  . Right ovarian cyst 08/16/2019  . Family history of congenital aortic stenosis 09/23/2017  . Herpes genitalis 02/08/2017  . IBS (irritable bowel syndrome) 05/09/2015  . Morbid obesity (HCC) 05/09/2015  . OCD (obsessive compulsive disorder) 05/09/2015  . Bilateral polycystic ovarian syndrome 05/09/2015  . Psoriasis 05/09/2015  . History of depression 05/09/2015  . Family history of congenital heart defect 08/08/2014  . Allergic rhinitis 05/02/2010    Past Surgical History:  Procedure Laterality Date  . CHOLECYSTECTOMY  01/09/2011   Dr. Excell Seltzer    Family History  Problem Relation Age of Onset  . Hypertension Mother   . Diabetes Mother   . Headache Mother   . Hypertension Father   . Lung cancer Father   . Asthma Father   . Heart disease Son        Aortic Valve Disorder Stenosis-had balloon valvuloplasty, on meds  . Ovarian cancer Maternal Grandmother   . Colon cancer Maternal Grandfather     Social History   Tobacco Use  . Smoking status: Former Games developer  . Smokeless tobacco: Never Used  Substance Use Topics  . Alcohol use: No    Alcohol/week: 0.0 standard drinks     Current Outpatient Medications:  .  albuterol (VENTOLIN HFA) 108 (90 Base) MCG/ACT inhaler, Inhale 2 puffs into the lungs every 4 (four) hours as needed for wheezing or shortness of breath., Disp: 6.7 g, Rfl: 0 .  amoxicillin (AMOXIL) 500 MG capsule, Take 1 capsule (500 mg total) by mouth 3 (three) times daily., Disp: 30 capsule, Rfl: 0 .  amoxicillin-clavulanate (AUGMENTIN) 875-125 MG tablet, amoxicillin 875 mg-potassium clavulanate 125 mg tablet, Disp: , Rfl:  .  butalbital-acetaminophen-caffeine (FIORICET)  50-325-40 MG tablet, Take 1 tablet by mouth every 6 (six) hours as needed for headache., Disp: 20 tablet, Rfl: 0 .  cyclobenzaprine (FLEXERIL) 10 MG tablet, Take 10 mg by mouth 3 (three) times daily as needed for muscle spasms., Disp: , Rfl:  .  HYDROcodone-acetaminophen (NORCO/VICODIN) 5-325 MG tablet, Take 1 tablet by mouth every 6 (six) hours as needed for moderate pain., Disp: , Rfl:  .  levonorgestrel (MIRENA, 52 MG,) 20 MCG/24HR IUD, 1 Intra Uterine Device (1 each total) by Intrauterine route once for 1 dose., Disp: 1 each, Rfl: 0 .  lidocaine (XYLOCAINE) 2 % solution, Use as directed 15 mLs in the mouth or throat every 4 (four) hours as needed for mouth pain., Disp: 100 mL, Rfl: 0 .  ondansetron (ZOFRAN) 8 MG tablet, Take 1 tablet (8 mg total) by mouth every 8 (eight) hours as needed for nausea or vomiting., Disp: 20 tablet, Rfl: 0 .  orphenadrine (NORFLEX) 100 MG tablet, Take 1 tablet (100 mg total) by mouth 2 (two) times daily., Disp: 10 tablet, Rfl: 0 .  pimecrolimus (ELIDEL) 1 % cream, APPLY EXTERNALLY TO THE AFFECTED AREA TWICE DAILY, Disp: 100 g, Rfl: 0 .  triamcinolone cream (KENALOG) 0.1 %, APPLY EXTERNALLY TO THE AFFECTED AREA TWICE DAILY, Disp: 30 g, Rfl: 0 .  valACYclovir (VALTREX) 500 MG tablet, Take 1 tablet (500 mg total) by mouth 2 (two) times daily as needed. Prn outbreak x 3 days, Disp: 30 tablet,  Rfl: 1  No Active Allergies  I personally reviewed {Reviewed:14835} with the patient/caregiver today.   ROS  ***  Objective  There were no vitals filed for this visit.  There is no height or weight on file to calculate BMI.  Physical Exam ***  No results found for this or any previous visit (from the past 2160 hour(s)).  Diabetic Foot Exam: Diabetic Foot Exam - Simple   No data filed    ***  PHQ2/9: Depression screen [facility] 2/9 05/04/2020 02/08/2020 12/13/2019 07/08/2019 05/12/2019  Decreased Interest 0 1 0 0 1  Down, Depressed, Hopeless 0 1 0 0 1  PHQ - 2 Score 0 2 0  0 2  Altered sleeping 0 0 0 0 0  Tired, decreased energy 0 1 0 1 1  Change in appetite 0 0 0 0 0  Feeling bad or failure about yourself  0 0 0 0 0  Trouble concentrating 0 0 0 0 0  Moving slowly or fidgety/restless 0 0 0 0 0  Suicidal thoughts 0 0 0 0 0  PHQ-9 Score 0 3 0 1 3  Difficult doing work/chores Not difficult at all Not difficult at all Not difficult at all Not difficult at all Not difficult at all    phq 9 is {gen pos LTJ:030092} ***  Fall Risk: Fall Risk  05/04/2020 02/08/2020 12/13/2019 07/08/2019 05/12/2019  Falls in the past year? 1 0 1 0 0  Number falls in past yr: 1 0 0 0 0  Injury with Fall? 0 0 1 0 0  Follow up - - Falls evaluation completed - -   ***   Functional Status Survey:   ***   Assessment & Plan  *** There are no diagnoses linked to this encounter.

## 2020-12-05 ENCOUNTER — Ambulatory Visit: Payer: Medicaid Other | Admitting: Family Medicine

## 2020-12-05 DIAGNOSIS — N939 Abnormal uterine and vaginal bleeding, unspecified: Secondary | ICD-10-CM

## 2020-12-05 DIAGNOSIS — N898 Other specified noninflammatory disorders of vagina: Secondary | ICD-10-CM

## 2020-12-05 DIAGNOSIS — Z1159 Encounter for screening for other viral diseases: Secondary | ICD-10-CM

## 2020-12-13 ENCOUNTER — Other Ambulatory Visit: Payer: Self-pay | Admitting: Obstetrics and Gynecology

## 2020-12-13 ENCOUNTER — Other Ambulatory Visit: Payer: Self-pay

## 2020-12-13 DIAGNOSIS — A6000 Herpesviral infection of urogenital system, unspecified: Secondary | ICD-10-CM

## 2020-12-13 NOTE — Telephone Encounter (Signed)
Pt calling for refill of valtrex.  [facility] [place].  [phone]

## 2020-12-29 ENCOUNTER — Other Ambulatory Visit: Payer: Self-pay

## 2020-12-29 ENCOUNTER — Emergency Department: Payer: Medicaid Other

## 2020-12-29 ENCOUNTER — Emergency Department
Admission: EM | Admit: 2020-12-29 | Discharge: 2020-12-29 | Disposition: A | Discharge status: Home / Self Care | Payer: Medicaid Other | Attending: Emergency Medicine | Admitting: Emergency Medicine

## 2020-12-29 DIAGNOSIS — K219 Gastro-esophageal reflux disease without esophagitis: Secondary | ICD-10-CM | POA: Diagnosis not present

## 2020-12-29 DIAGNOSIS — R112 Nausea with vomiting, unspecified: Secondary | ICD-10-CM | POA: Diagnosis not present

## 2020-12-29 DIAGNOSIS — Z87891 Personal history of nicotine dependence: Secondary | ICD-10-CM | POA: Diagnosis not present

## 2020-12-29 DIAGNOSIS — R109 Unspecified abdominal pain: Secondary | ICD-10-CM | POA: Insufficient documentation

## 2020-12-29 LAB — BASIC METABOLIC PANEL
Anion gap: 9 (ref 5–15)
BUN: 12 mg/dL (ref 6–20)
CO2: 24 mmol/L (ref 22–32)
Calcium: 8.5 mg/dL — ABNORMAL LOW (ref 8.9–10.3)
Chloride: 101 mmol/L (ref 98–111)
Creatinine, Ser: 0.68 mg/dL (ref 0.44–1.00)
GFR, Estimated: >60 mL/min (ref >60)
Glucose, Bld: 108 mg/dL — ABNORMAL HIGH (ref 70–99)
Potassium: 3.5 mmol/L (ref 3.5–5.1)
Sodium: 134 mmol/L — ABNORMAL LOW (ref 135–145)

## 2020-12-29 LAB — CBC WITH DIFFERENTIAL/PLATELET
Abs Immature Granulocytes: 0.01 10*3/uL (ref 0.00–0.07)
Basophils Absolute: 0 10*3/uL (ref 0.0–0.1)
Basophils Relative: 1 %
Eosinophils Absolute: 0.2 10*3/uL (ref 0.0–0.5)
Eosinophils Relative: 3 %
HCT: 40.8 % (ref 36.0–46.0)
Hemoglobin: 13.5 g/dL (ref 12.0–15.0)
Immature Granulocytes: 0 %
Lymphocytes Relative: 22 %
Lymphs Abs: 1.3 10*3/uL (ref 0.7–4.0)
MCH: 27.3 pg (ref 26.0–34.0)
MCHC: 33.1 g/dL (ref 30.0–36.0)
MCV: 82.4 fL (ref 80.0–100.0)
Monocytes Absolute: 0.4 10*3/uL (ref 0.1–1.0)
Monocytes Relative: 7 %
Neutro Abs: 4 10*3/uL (ref 1.7–7.7)
Neutrophils Relative %: 67 %
Platelets: 171 10*3/uL (ref 150–400)
RBC: 4.95 MIL/uL (ref 3.87–5.11)
RDW: 13.3 % (ref 11.5–15.5)
WBC: 6 10*3/uL (ref 4.0–10.5)
nRBC: 0 % (ref 0.0–0.2)

## 2020-12-29 LAB — HEPATIC FUNCTION PANEL
ALT: 30 U/L (ref 0–44)
AST: 25 U/L (ref 15–41)
Albumin: 3.7 g/dL (ref 3.5–5.0)
Alkaline Phosphatase: 81 U/L (ref 38–126)
Bilirubin, Direct: <0.1 mg/dL (ref 0.0–0.2)
Total Bilirubin: 0.5 mg/dL (ref 0.3–1.2)
Total Protein: 7.3 g/dL (ref 6.5–8.1)

## 2020-12-29 LAB — POC URINE PREG, ED: Preg Test, Ur: NEGATIVE

## 2020-12-29 LAB — URINALYSIS, COMPLETE (UACMP) WITH MICROSCOPIC
Bilirubin Urine: NEGATIVE
Glucose, UA: NEGATIVE mg/dL
Ketones, ur: NEGATIVE mg/dL
Leukocytes,Ua: NEGATIVE
Nitrite: NEGATIVE
Protein, ur: NEGATIVE mg/dL
Specific Gravity, Urine: 1.01 (ref 1.005–1.030)
WBC, UA: NONE SEEN WBC/hpf (ref 0–5)
pH: 5 (ref 5.0–8.0)

## 2020-12-29 LAB — LIPASE, BLOOD: Lipase: 23 U/L (ref 11–51)

## 2020-12-29 LAB — TROPONIN I (HIGH SENSITIVITY): Troponin I (High Sensitivity): <2 ng/L (ref <18)

## 2020-12-29 MED ORDER — ONDANSETRON 4 MG PO TBDP: 4.0000 mg | ORAL_TABLET | Freq: Three times a day (TID) | ORAL | 0 refills | Status: DC | PRN

## 2020-12-29 MED ORDER — ONDANSETRON 4 MG PO TBDP
4.0000 mg | ORAL_TABLET | Freq: Once | ORAL | Status: AC
  Administered 2020-12-29: 4 mg via ORAL
  Filled 2020-12-29: qty 1

## 2020-12-29 NOTE — ED Notes (Signed)
First Nurse Note: Pt ambulatory into ED c/o chest pain since last night. Pt is in NAD.

## 2020-12-29 NOTE — ED Provider Notes (Signed)
[facility] Emergency Department Provider Note  ____________________________________________   Event Date/Time   First MD Initiated Contact with Patient 12/29/20 1543     (approximate)  I have reviewed the triage vital signs and the nursing notes.   HISTORY  Chief Complaint Chest Pain (Onset : Yesterday ; describes pain as sharp, radiating to her sides and back, also complains of N/V)    HPI Lisa House is a 30 y.o. female presents emergency department complaining of vomiting and intermittent abdominal pain.  States all 3 of her children have been sick this week with a GI bug.  Had been tested for Covid and they were negative.  She denies any fever or chills.  No chest pain/shortness of breath.    Past Medical History:  Diagnosis Date  . Allergy   . Depression   . Eczema   . GERD (gastroesophageal reflux disease)   . Intertrigo   . Obesity   . OCD (obsessive compulsive disorder)   . Panic attack     Patient Active Problem List   Diagnosis Date Noted  . Lumbar radiculopathy 04/27/2020  . Neck pain 02/14/2020  . Right ovarian cyst 08/16/2019  . Family history of congenital aortic stenosis 09/23/2017  . Herpes genitalis 02/08/2017  . IBS (irritable bowel syndrome) 05/09/2015  . Morbid obesity (HCC) 05/09/2015  . OCD (obsessive compulsive disorder) 05/09/2015  . Bilateral polycystic ovarian syndrome 05/09/2015  . Psoriasis 05/09/2015  . History of depression 05/09/2015  . Family history of congenital heart defect 08/08/2014  . Allergic rhinitis 05/02/2010    Past Surgical History:  Procedure Laterality Date  . CHOLECYSTECTOMY  01/09/2011   Dr. Excell Seltzer    Prior to Admission medications   Medication Sig Start Date End Date Taking? Authorizing Provider  ondansetron (ZOFRAN-ODT) 4 MG disintegrating tablet Take 1 tablet (4 mg total) by mouth every 8 (eight) hours as needed. 12/29/20  Yes Juniper Cobey, Roselyn Bering, PA-C  albuterol (VENTOLIN HFA)  108 (90 Base) MCG/ACT inhaler Inhale 2 puffs into the lungs every 4 (four) hours as needed for wheezing or shortness of breath. 05/04/20   Danelle Berry, PA-C  amoxicillin (AMOXIL) 500 MG capsule Take 1 capsule (500 mg total) by mouth 3 (three) times daily. 08/25/20   Menshew, Charlesetta Ivory, PA-C  amoxicillin-clavulanate (AUGMENTIN) 875-125 MG tablet amoxicillin 875 mg-potassium clavulanate 125 mg tablet    [provider]  butalbital-acetaminophen-caffeine (FIORICET) 50-325-40 MG tablet Take 1 tablet by mouth every 6 (six) hours as needed for headache. 07/21/20 07/21/21  Loleta Rose, MD  cyclobenzaprine (FLEXERIL) 10 MG tablet Take 10 mg by mouth 3 (three) times daily as needed for muscle spasms.    [provider]  HYDROcodone-acetaminophen (NORCO/VICODIN) 5-325 MG tablet Take 1 tablet by mouth every 6 (six) hours as needed for moderate pain.    [provider]  levonorgestrel (MIRENA, 52 MG,) 20 MCG/24HR IUD 1 Intra Uterine Device (1 each total) by Intrauterine route once for 1 dose. 06/15/18 06/15/18  [name], MD  lidocaine (XYLOCAINE) 2 % solution Use as directed 15 mLs in the mouth or throat every 4 (four) hours as needed for mouth pain. 08/25/20   Menshew, Charlesetta Ivory, PA-C  ondansetron (ZOFRAN) 8 MG tablet Take 1 tablet (8 mg total) by mouth every 8 (eight) hours as needed for nausea or vomiting. 05/25/20   Joni Reining, PA-C  orphenadrine (NORFLEX) 100 MG tablet Take 1 tablet (100 mg total) by mouth 2 (two) times  daily. 05/25/20   Joni Reining, PA-C  pimecrolimus (ELIDEL) 1 % cream APPLY EXTERNALLY TO THE AFFECTED AREA TWICE DAILY 05/12/19   Carlynn Purl, Danna Hefty, MD  triamcinolone cream (KENALOG) 0.1 % APPLY EXTERNALLY TO THE AFFECTED AREA TWICE DAILY 04/26/20   Carlynn Purl, Danna Hefty, MD  valACYclovir (VALTREX) 500 MG tablet Take 1 tablet (500 mg total) by mouth 2 (two) times daily as needed. Prn outbreak x 3 days 08/16/19   Copland, Ilona Sorrel, PA-C    Allergies Patient  has no active allergies.  Family History  Problem Relation Age of Onset  . Hypertension Mother   . Diabetes Mother   . Headache Mother   . Hypertension Father   . Lung cancer Father   . Asthma Father   . Heart disease Son        Aortic Valve Disorder Stenosis-had balloon valvuloplasty, on meds  . Ovarian cancer Maternal Grandmother   . Colon cancer Maternal Grandfather     Social History Social History   Tobacco Use  . Smoking status: Former Games developer  . Smokeless tobacco: Never Used  Vaping Use  . Vaping Use: Never used  Substance Use Topics  . Alcohol use: No    Alcohol/week: 0.0 standard drinks  . Drug use: No    Review of Systems  Constitutional: No fever/chills Eyes: No visual changes. ENT: No sore throat. Respiratory: Denies cough Cardiovascular: Denies chest pain Gastrointestinal: Positive for nausea/abdominal pain Genitourinary: Negative for dysuria. Musculoskeletal: Negative for back pain. Skin: Negative for rash. Psychiatric: no mood changes,     ____________________________________________   PHYSICAL EXAM:  VITAL SIGNS: ED Triage Vitals  Enc Vitals Group     BP 12/29/20 1519 137/68     Pulse Rate 12/29/20 1519 85     Resp 12/29/20 1519 18     Temp 12/29/20 1519 98.1 F (36.7 C)     Temp Source 12/29/20 1519 Oral     SpO2 12/29/20 1519 96 %     Weight 12/29/20 1512 238 lb (108 kg)     Height 12/29/20 1512 5\' 1"  (1.549 m)     Head Circumference --      Peak Flow --      Pain Score 12/29/20 1512 4     Pain Loc --      Pain Edu? --      Excl. in GC? --     Constitutional: Alert and oriented. Well appearing and in no acute distress. Eyes: Conjunctivae are normal.  Head: Atraumatic. Nose: No congestion/rhinnorhea. Mouth/Throat: Mucous membranes are moist.   Neck:  supple no lymphadenopathy noted Cardiovascular: Normal rate, regular rhythm. Heart sounds are normal Respiratory: Normal respiratory effort.  No retractions, lungs c t a  Abd:  soft nontender bs normal all 4 quad GU: deferred Musculoskeletal: FROM all extremities, warm and well perfused Neurologic:  Normal speech and language.  Skin:  Skin is warm, dry and intact. No rash noted. Psychiatric: Mood and affect are normal. Speech and behavior are normal.  ____________________________________________   LABS (all labs ordered are listed, but only abnormal results are displayed)  Labs Reviewed  BASIC METABOLIC PANEL - Abnormal; Notable for the following components:      Result Value   Sodium 134 (*)    Glucose, Bld 108 (*)    Calcium 8.5 (*)    All other components within normal limits  URINALYSIS, COMPLETE (UACMP) WITH MICROSCOPIC - Abnormal; Notable for the following components:   Color, Urine YELLOW (*)  APPearance CLEAR (*)    Hgb urine dipstick MODERATE (*)    Bacteria, UA RARE (*)    All other components within normal limits  CBC WITH DIFFERENTIAL/PLATELET  HEPATIC FUNCTION PANEL  LIPASE, BLOOD  POC URINE PREG, ED  TROPONIN I (HIGH SENSITIVITY)   ____________________________________________   ____________________________________________  RADIOLOGY   Chest x-ray ____________________________________________   PROCEDURES  Procedure(s) performed: No  Procedures    ____________________________________________   INITIAL IMPRESSION / ASSESSMENT AND PLAN / ED COURSE  Pertinent labs & imaging results that were available during my care of the patient were reviewed by me and considered in my medical decision making (see chart for details).   Patient is 30 year old female presents with nausea/vomiting.  See HPI.  Physical exam shows patient appears stable.  Abdomen is nontender.  DDx: Gastroenteritis, pregnancy, covid  Basic metabolic panel is normal, hepatic panel is normal, lipase normal, CBC is normal, troponin is normal  Patient is refusing Covid swab.  UA/POC pregnancy is pending   UA and POC pregnancy test are negative. Chest  x-ray reviewed by me confirmed by radiology is negative  I did explain all findings to the patient.  Feel this is more of a gastroenteritis.  She is to follow-up with her regular doctor if not improving in 2 to 3 days.  Return emergency department for worsening.  She is given prescription for Zofran, a work note, and discharged in stable condition.  Lisa House was evaluated in Emergency Department on 12/29/2020 for the symptoms described in the history of present illness. She was evaluated in the context of the global COVID-19 pandemic, which necessitated consideration that the patient might be at risk for infection with the SARS-CoV-2 virus that causes COVID-19. Institutional protocols and algorithms that pertain to the evaluation of patients at risk for COVID-19 are in a state of rapid change based on information released by regulatory bodies including the CDC and federal and state organizations. These policies and algorithms were followed during the patient's care in the ED.    As part of my medical decision making, I reviewed the following data within the electronic MEDICAL RECORD NUMBER Nursing notes reviewed and incorporated, Labs reviewed , Old chart reviewed, Radiograph reviewed , Notes from prior ED visits and [place] Controlled Substance Database  ____________________________________________   FINAL CLINICAL IMPRESSION(S) / ED DIAGNOSES  Final diagnoses:  Nausea and vomiting in adult      NEW MEDICATIONS STARTED DURING THIS VISIT:  Discharge Medication List as of 12/29/2020  5:38 PM    START taking these medications   Details  ondansetron (ZOFRAN-ODT) 4 MG disintegrating tablet Take 1 tablet (4 mg total) by mouth every 8 (eight) hours as needed., Starting Sat 12/29/2020, Print         Note:  This document was prepared using Dragon voice recognition software and may include unintentional dictation errors.    Faythe Ghee, PA-C 12/29/20 2040    Sharman Cheek,  MD 12/30/20 [phone]

## 2020-12-29 NOTE — ED Notes (Signed)
Pt to ED stating her 4 kids this week all got sick with GI virus, then she got sick yesterday (nausea). Vomited 2 times, has had constant nausea since yesterday. Denies diarrhea. States that has been having intermittent gas pains and when she feels these, she also feels pain in chest and shoulders. LBM was yesterday, it had been 2 days before that and she felt constipated and forced herself to have BM. Pt in NAD at this time.

## 2020-12-29 NOTE — Discharge Instructions (Signed)
Follow-up with your regular doctor if not improving in 2 to 3 days.  Return emergency department worsening.  Take your Zofran as needed for nausea/vomiting

## 2020-12-29 NOTE — ED Triage Notes (Addendum)
Onset : Yesterday ; describes pain as sharp, radiating to her sides and back, also complains of N/V . Pain scale 4/10.  Mild body aches . Pt appears calm ,collective

## 2021-01-18 ENCOUNTER — Telehealth: Payer: Self-pay

## 2021-01-18 NOTE — Telephone Encounter (Signed)
Copied from CRM [id number]. Topic: General - Other >> Jan 17, 2021  2:50 PM Jaquita Rector A wrote: Reason for CRM: Patient called in to inform Dr Carlynn Purl that she went to the ED several time had some blood work done and need Dr Carlynn Purl to look over the lab results and let her know if her organs are failing. Please call Ph# [phone]

## 2021-01-18 NOTE — Telephone Encounter (Signed)
Pt has an appt on 02/01/21, first available

## 2021-01-31 NOTE — Progress Notes (Deleted)
Name: Lisa House   MRN: [id number]    DOB: 1991/10/13   Date:01/31/2021       Progress Note  Subjective  Chief Complaint  Follow Up- Anxiety  HPI    Patient Active Problem List   Diagnosis Date Noted  . Lumbar radiculopathy 04/27/2020  . Neck pain 02/14/2020  . Right ovarian cyst 08/16/2019  . Family history of congenital aortic stenosis 09/23/2017  . Herpes genitalis 02/08/2017  . IBS (irritable bowel syndrome) 05/09/2015  . Morbid obesity (HCC) 05/09/2015  . OCD (obsessive compulsive disorder) 05/09/2015  . Bilateral polycystic ovarian syndrome 05/09/2015  . Psoriasis 05/09/2015  . History of depression 05/09/2015  . Family history of congenital heart defect 08/08/2014  . Allergic rhinitis 05/02/2010    Past Surgical History:  Procedure Laterality Date  . CHOLECYSTECTOMY  01/09/2011   Dr. Excell Seltzer    Family History  Problem Relation Age of Onset  . Hypertension Mother   . Diabetes Mother   . Headache Mother   . Hypertension Father   . Lung cancer Father   . Asthma Father   . Heart disease Son        Aortic Valve Disorder Stenosis-had balloon valvuloplasty, on meds  . Ovarian cancer Maternal Grandmother   . Colon cancer Maternal Grandfather     Social History   Tobacco Use  . Smoking status: Former Games developer  . Smokeless tobacco: Never Used  Substance Use Topics  . Alcohol use: No    Alcohol/week: 0.0 standard drinks     Current Outpatient Medications:  .  albuterol (VENTOLIN HFA) 108 (90 Base) MCG/ACT inhaler, Inhale 2 puffs into the lungs every 4 (four) hours as needed for wheezing or shortness of breath., Disp: 6.7 g, Rfl: 0 .  amoxicillin (AMOXIL) 500 MG capsule, Take 1 capsule (500 mg total) by mouth 3 (three) times daily., Disp: 30 capsule, Rfl: 0 .  amoxicillin-clavulanate (AUGMENTIN) 875-125 MG tablet, amoxicillin 875 mg-potassium clavulanate 125 mg tablet, Disp: , Rfl:  .  butalbital-acetaminophen-caffeine (FIORICET) 50-325-40 MG tablet, Take  1 tablet by mouth every 6 (six) hours as needed for headache., Disp: 20 tablet, Rfl: 0 .  cyclobenzaprine (FLEXERIL) 10 MG tablet, Take 10 mg by mouth 3 (three) times daily as needed for muscle spasms., Disp: , Rfl:  .  HYDROcodone-acetaminophen (NORCO/VICODIN) 5-325 MG tablet, Take 1 tablet by mouth every 6 (six) hours as needed for moderate pain., Disp: , Rfl:  .  levonorgestrel (MIRENA, 52 MG,) 20 MCG/24HR IUD, 1 Intra Uterine Device (1 each total) by Intrauterine route once for 1 dose., Disp: 1 each, Rfl: 0 .  lidocaine (XYLOCAINE) 2 % solution, Use as directed 15 mLs in the mouth or throat every 4 (four) hours as needed for mouth pain., Disp: 100 mL, Rfl: 0 .  ondansetron (ZOFRAN) 8 MG tablet, Take 1 tablet (8 mg total) by mouth every 8 (eight) hours as needed for nausea or vomiting., Disp: 20 tablet, Rfl: 0 .  ondansetron (ZOFRAN-ODT) 4 MG disintegrating tablet, Take 1 tablet (4 mg total) by mouth every 8 (eight) hours as needed., Disp: 20 tablet, Rfl: 0 .  orphenadrine (NORFLEX) 100 MG tablet, Take 1 tablet (100 mg total) by mouth 2 (two) times daily., Disp: 10 tablet, Rfl: 0 .  pimecrolimus (ELIDEL) 1 % cream, APPLY EXTERNALLY TO THE AFFECTED AREA TWICE DAILY, Disp: 100 g, Rfl: 0 .  triamcinolone cream (KENALOG) 0.1 %, APPLY EXTERNALLY TO THE AFFECTED AREA TWICE DAILY, Disp: 30 g, Rfl: 0 .  valACYclovir (VALTREX) 500 MG tablet, Take 1 tablet (500 mg total) by mouth 2 (two) times daily as needed. Prn outbreak x 3 days, Disp: 30 tablet, Rfl: 1  No Active Allergies  I personally reviewed {Reviewed:14835} with the patient/caregiver today.   ROS  ***  Objective  There were no vitals filed for this visit.  There is no height or weight on file to calculate BMI.  Physical Exam ***  Recent Results (from the past 2160 hour(s))  Basic metabolic panel     Status: Abnormal   Collection Time: 12/29/20  3:23 PM  Result Value Ref Range   Sodium 134 (L) 135 - 145 mmol/L   Potassium 3.5 3.5  - 5.1 mmol/L   Chloride 101 98 - 111 mmol/L   CO2 24 22 - 32 mmol/L   Glucose, Bld 108 (H) 70 - 99 mg/dL    Comment: Glucose reference range applies only to samples taken after fasting for at least 8 hours.   BUN 12 6 - 20 mg/dL   Creatinine, Ser 7.48 0.44 - 1.00 mg/dL   Calcium 8.5 (L) 8.9 - 10.3 mg/dL   GFR, Estimated >27 >07 mL/min    Comment: (NOTE) Calculated using the CKD-EPI Creatinine Equation (2021)    Anion gap 9 5 - 15    Comment: Performed at [facility], [address] [place], [place] [address]  CBC with Differential     Status: None   Collection Time: 12/29/20  3:23 PM  Result Value Ref Range   WBC 6.0 4.0 - 10.5 K/uL   RBC 4.95 3.87 - 5.11 MIL/uL   Hemoglobin 13.5 12.0 - 15.0 g/dL   HCT 49.2 01.0 - 07.1 %   MCV 82.4 80.0 - 100.0 fL   MCH 27.3 26.0 - 34.0 pg   MCHC 33.1 30.0 - 36.0 g/dL   RDW 21.9 75.8 - 83.2 %   Platelets 171 150 - 400 K/uL   nRBC 0.0 0.0 - 0.2 %   Neutrophils Relative % 67 %   Neutro Abs 4.0 1.7 - 7.7 K/uL   Lymphocytes Relative 22 %   Lymphs Abs 1.3 0.7 - 4.0 K/uL   Monocytes Relative 7 %   Monocytes Absolute 0.4 0.1 - 1.0 K/uL   Eosinophils Relative 3 %   Eosinophils Absolute 0.2 0.0 - 0.5 K/uL   Basophils Relative 1 %   Basophils Absolute 0.0 0.0 - 0.1 K/uL   Immature Granulocytes 0 %   Abs Immature Granulocytes 0.01 0.00 - 0.07 K/uL    Comment: Performed at [facility], [address] [place], [place] [address]  Troponin I (High Sensitivity)     Status: None   Collection Time: 12/29/20  3:23 PM  Result Value Ref Range   Troponin I (High Sensitivity) <2 <18 ng/L    Comment: (NOTE) Elevated high sensitivity troponin I (hsTnI) values and significant  changes across serial measurements may suggest ACS but many other  chronic and acute conditions are known to elevate hsTnI results.  Refer to the "Links" section for chest pain algorithms and additional  guidance. Performed at [facility], [address] [place], [place] [address]   Hepatic function panel     Status: None   Collection Time: 12/29/20  3:23 PM  Result Value Ref Range   Total Protein 7.3 6.5 - 8.1 g/dL   Albumin 3.7 3.5 - 5.0 g/dL   AST 25 15 - 41 U/L   ALT 30 0 -  44 U/L   Alkaline Phosphatase 81 38 - 126 U/L   Total Bilirubin 0.5 0.3 - 1.2 mg/dL   Bilirubin, Direct <1.0 0.0 - 0.2 mg/dL   Indirect Bilirubin NOT CALCULATED 0.3 - 0.9 mg/dL    Comment: Performed at [facility], [address] [place], [place] [address]  Lipase, blood     Status: None   Collection Time: 12/29/20  3:23 PM  Result Value Ref Range   Lipase 23 11 - 51 U/L    Comment: Performed at [facility], [address] [place], [place] [address]  Urinalysis, Complete w Microscopic Urine, Clean Catch     Status: Abnormal   Collection Time: 12/29/20  5:00 PM  Result Value Ref Range   Color, Urine YELLOW (A) YELLOW   APPearance CLEAR (A) CLEAR   Specific Gravity, Urine 1.010 1.005 - 1.030   pH 5.0 5.0 - 8.0   Glucose, UA NEGATIVE NEGATIVE mg/dL   Hgb urine dipstick MODERATE (A) NEGATIVE   Bilirubin Urine NEGATIVE NEGATIVE   Ketones, ur NEGATIVE NEGATIVE mg/dL   Protein, ur NEGATIVE NEGATIVE mg/dL   Nitrite NEGATIVE NEGATIVE   Leukocytes,Ua NEGATIVE NEGATIVE   RBC / HPF 6-10 0 - 5 RBC/hpf   WBC, UA NONE SEEN 0 - 5 WBC/hpf   Bacteria, UA RARE (A) NONE SEEN   Squamous Epithelial / LPF 0-5 0 - 5   Mucus PRESENT     Comment: Performed at [facility], [address] [place], [place] [address]  POC Urine Pregnancy, ED     Status: None   Collection Time: 12/29/20  5:02 PM  Result Value Ref Range   Preg Test, Ur NEGATIVE NEGATIVE    Comment:        THE SENSITIVITY OF THIS METHODOLOGY IS >24 mIU/mL     Diabetic Foot Exam: Diabetic Foot Exam - Simple   No data filed    ***  PHQ2/9: Depression screen [facility] 2/9 05/04/2020 02/08/2020 12/13/2019 07/08/2019 05/12/2019  Decreased Interest 0 1 0 0 1  Down, Depressed,  Hopeless 0 1 0 0 1  PHQ - 2 Score 0 2 0 0 2  Altered sleeping 0 0 0 0 0  Tired, decreased energy 0 1 0 1 1  Change in appetite 0 0 0 0 0  Feeling bad or failure about yourself  0 0 0 0 0  Trouble concentrating 0 0 0 0 0  Moving slowly or fidgety/restless 0 0 0 0 0  Suicidal thoughts 0 0 0 0 0  PHQ-9 Score 0 3 0 1 3  Difficult doing work/chores Not difficult at all Not difficult at all Not difficult at all Not difficult at all Not difficult at all    phq 9 is {gen pos HCW:237628} ***  Fall Risk: Fall Risk  05/04/2020 02/08/2020 12/13/2019 07/08/2019 05/12/2019  Falls in the past year? 1 0 1 0 0  Number falls in past yr: 1 0 0 0 0  Injury with Fall? 0 0 1 0 0  Follow up - - Falls evaluation completed - -   ***   Functional Status Survey:   ***   Assessment & Plan  *** There are no diagnoses linked to this encounter.

## 2021-02-01 ENCOUNTER — Ambulatory Visit: Payer: Medicaid Other | Admitting: Family Medicine

## 2021-02-06 ENCOUNTER — Telehealth: Payer: Self-pay

## 2021-02-06 NOTE — Telephone Encounter (Signed)
Copied from CRM [id number]. Topic: General - Other >> Feb 06, 2021 12:27 PM Gaetana Michaelis A wrote: Reason for CRM: Patient's top left wisdom tooth broke on 02/02/21 creating a sore in their mouth that they believe has become infected  Patient would like an antibiotic called in to their preferred  [id number] - [place], [place] - [address] AT [facility] OF [address] [facility] Phone:  [phone]  Patient has begun the process of looking for a dentist  Please contact to advise further if needed

## 2021-02-07 ENCOUNTER — Other Ambulatory Visit: Payer: Self-pay

## 2021-02-07 ENCOUNTER — Emergency Department
Admission: EM | Admit: 2021-02-07 | Discharge: 2021-02-07 | Disposition: A | Discharge status: Home / Self Care | Payer: Medicaid Other | Attending: Emergency Medicine | Admitting: Emergency Medicine

## 2021-02-07 DIAGNOSIS — Z87891 Personal history of nicotine dependence: Secondary | ICD-10-CM | POA: Diagnosis not present

## 2021-02-07 DIAGNOSIS — K0889 Other specified disorders of teeth and supporting structures: Secondary | ICD-10-CM | POA: Insufficient documentation

## 2021-02-07 MED ORDER — AMOXICILLIN 875 MG PO TABS: 875.0000 mg | ORAL_TABLET | Freq: Two times a day (BID) | ORAL | 0 refills | Status: AC

## 2021-02-07 NOTE — Telephone Encounter (Signed)
Tried to call pt to get her scheduled for an appt. VM is full

## 2021-02-07 NOTE — ED Provider Notes (Signed)
[facility]-EMERGENCY DEPARTMENT  ____________________________________________  Time seen: Approximately 11:23 AM  I have reviewed the triage vital signs and the nursing notes.   HISTORY  Chief Complaint Dental Pain   Historian Patient     HPI Lisa House is a 30 y.o. female presents to the emergency department with nonradiating, constant left upper dental pain for the past 4 days.  Patient states that she has not been able to locate a local dentist.  She has no pain underneath the tongue or difficulty swallowing.  She has been taking ibuprofen for pain and she reports that her pain is well-managed.   Past Medical History:  Diagnosis House  . Allergy   . Depression   . Eczema   . GERD (gastroesophageal reflux disease)   . Intertrigo   . Obesity   . OCD (obsessive compulsive disorder)   . Panic attack      Immunizations up to House:  Yes.     Past Medical History:  Diagnosis House  . Allergy   . Depression   . Eczema   . GERD (gastroesophageal reflux disease)   . Intertrigo   . Obesity   . OCD (obsessive compulsive disorder)   . Panic attack     Patient Active Problem List   Diagnosis House Noted  . Lumbar radiculopathy 04/27/2020  . Neck pain 02/14/2020  . Right ovarian cyst 08/16/2019  . Family history of congenital aortic stenosis 09/23/2017  . Herpes genitalis 02/08/2017  . IBS (irritable bowel syndrome) 05/09/2015  . Morbid obesity (HCC) 05/09/2015  . OCD (obsessive compulsive disorder) 05/09/2015  . Bilateral polycystic ovarian syndrome 05/09/2015  . Psoriasis 05/09/2015  . History of depression 05/09/2015  . Family history of congenital heart defect 08/08/2014  . Allergic rhinitis 05/02/2010    Past Surgical History:  Procedure Laterality House  . CHOLECYSTECTOMY  01/09/2011   Dr. Excell Seltzer    Prior to Admission medications   Medication Sig Start House End House Taking? Authorizing Provider  amoxicillin (AMOXIL) 875 MG tablet Take 1 tablet (875  mg total) by mouth 2 (two) times daily for 10 days. 02/07/21 02/17/21 Yes Pia Mau M, PA-C  albuterol (VENTOLIN HFA) 108 (90 Base) MCG/ACT inhaler Inhale 2 puffs into the lungs every 4 (four) hours as needed for wheezing or shortness of breath. 05/04/20   Danelle Berry, PA-C  butalbital-acetaminophen-caffeine (FIORICET) [phone] MG tablet Take 1 tablet by mouth every 6 (six) hours as needed for headache. 07/21/20 07/21/21  Loleta Rose, MD  cyclobenzaprine (FLEXERIL) 10 MG tablet Take 10 mg by mouth 3 (three) times daily as needed for muscle spasms.    [provider]  HYDROcodone-acetaminophen (NORCO/VICODIN) 5-325 MG tablet Take 1 tablet by mouth every 6 (six) hours as needed for moderate pain.    [provider]  levonorgestrel (MIRENA, 52 MG,) 20 MCG/24HR IUD 1 Intra Uterine Device (1 each total) by Intrauterine route once for 1 dose. 06/15/18 06/15/18  [name], MD  pimecrolimus (ELIDEL) 1 % cream APPLY EXTERNALLY TO THE AFFECTED AREA TWICE DAILY 05/12/19   Carlynn Purl, Danna Hefty, MD  triamcinolone cream (KENALOG) 0.1 % APPLY EXTERNALLY TO THE AFFECTED AREA TWICE DAILY 04/26/20   Carlynn Purl, Danna Hefty, MD  valACYclovir (VALTREX) 500 MG tablet Take 1 tablet (500 mg total) by mouth 2 (two) times daily as needed. Prn outbreak x 3 days 08/16/19   Copland, Ilona Sorrel, PA-C    Allergies Patient has no active allergies.  Family History  Problem Relation Age of Onset  .  Hypertension Mother   . Diabetes Mother   . Headache Mother   . Hypertension Father   . Lung cancer Father   . Asthma Father   . Heart disease Son        Aortic Valve Disorder Stenosis-had balloon valvuloplasty, on meds  . Ovarian cancer Maternal Grandmother   . Colon cancer Maternal Grandfather     Social History Social History   Tobacco Use  . Smoking status: Former Games developer  . Smokeless tobacco: Never Used  Vaping Use  . Vaping Use: Never used  Substance Use Topics  . Alcohol use: No    Alcohol/week: 0.0  standard drinks  . Drug use: No     Review of Systems  Constitutional: No fever/chills Eyes:  No discharge ENT: Patient has dental pain.  Respiratory: no cough. No SOB/ use of accessory muscles to breath Gastrointestinal:   No nausea, no vomiting.  No diarrhea.  No constipation. Musculoskeletal: Negative for musculoskeletal pain. Skin: Negative for rash, abrasions, lacerations, ecchymosis.    ____________________________________________   PHYSICAL EXAM:  VITAL SIGNS: ED Triage Vitals  Enc Vitals Group     BP 02/07/21 1038 118/70     Pulse Rate 02/07/21 1038 68     Resp 02/07/21 1038 18     Temp 02/07/21 1038 97.9 F (36.6 C)     Temp Source 02/07/21 1038 Oral     SpO2 02/07/21 1038 99 %     Weight 02/07/21 1040 245 lb (111.1 kg)     Height 02/07/21 1040 5\' 5"  (1.651 m)     Head Circumference --      Peak Flow --      Pain Score 02/07/21 1040 5     Pain Loc --      Pain Edu? --      Excl. in GC? --      Constitutional: Alert and oriented. Well appearing and in no acute distress. Eyes: Conjunctivae are normal. PERRL. EOMI. Head: Atraumatic. ENT:      Nose: No congestion/rhinnorhea.      Mouth/Throat: Mucous membranes are moist.  Patient has broken superior 15.  No pain underneath the tongue.  No swelling of the neck. Neck: No stridor.  No cervical spine tenderness to palpation.  Cardiovascular: Normal rate, regular rhythm. Normal S1 and S2.  Good peripheral circulation. Respiratory: Normal respiratory effort without tachypnea or retractions. Lungs CTAB. Good air entry to the bases with no decreased or absent breath sounds Gastrointestinal: Bowel sounds x 4 quadrants. Soft and nontender to palpation. No guarding or rigidity. No distention. Musculoskeletal: Full range of motion to all extremities. No obvious deformities noted Neurologic:  Normal for age. No gross focal neurologic deficits are appreciated.  Skin:  Skin is warm, dry and intact. No rash  noted. Psychiatric: Mood and affect are normal for age. Speech and behavior are normal.   ____________________________________________   LABS (all labs ordered are listed, but only abnormal results are displayed)  Labs Reviewed - No data to display ____________________________________________  EKG   ____________________________________________  RADIOLOGY   No results found.  ____________________________________________    PROCEDURES  Procedure(s) performed:     Procedures     Medications - No data to display   ____________________________________________   INITIAL IMPRESSION / ASSESSMENT AND PLAN / ED COURSE  Pertinent labs & imaging results that were available during my care of the patient were reviewed by me and considered in my medical decision making (see chart for details).  Assessment and Plan:  Dental pain 30 year old female presents to the emergency department with left upper dental pain for the past 4 days.  Patient has broken superior 15.  We will treat with amoxicillin twice daily for the next 10 days.  Patient was offered Toradol and she declined stating that she would take ibuprofen at home.  Dental resources were provided in patient's discharge paperwork.  All patient questions were answered.      ____________________________________________  FINAL CLINICAL IMPRESSION(S) / ED DIAGNOSES  Final diagnoses:  Pain, dental      NEW MEDICATIONS STARTED DURING THIS VISIT:  ED Discharge Orders         Ordered    amoxicillin (AMOXIL) 875 MG tablet  2 times daily        02/07/21 1121              This chart was dictated using voice recognition software/Dragon. Despite best efforts to proofread, errors can occur which can change the meaning. Any change was purely unintentional.     Orvil Feil, PA-C 02/07/21 1137    Merwyn Katos, MD 02/14/21 0800

## 2021-02-07 NOTE — ED Notes (Signed)
See triage note  Presents with broken wisdom tooth and possible infection at gumline   States she broke the tooth about 5 days ago  Began feeling nauseated with some gum swelling couple of days ago

## 2021-02-07 NOTE — Discharge Instructions (Signed)
Take Amoxicillin twice daily for ten days.   OPTIONS FOR DENTAL FOLLOW UP CARE  [place] Department of Health and Human Services - Local Safety Net Dental Clinics http://www.ncdhhs.gov/dph/oralhealth/services/safetynetclinics.htm   [facility] ([phone])  [facility] ([phone])  [facility] ([phone])  [facility] ([phone])  [facility] ([phone]) This clinic caters to the indigent population and is on a lottery system. Location: [facility], [address], [place] Clinic Hours: Wednesdays from 6pm - 9pm, patients seen by a lottery system. For dates, call or go to [email or web address] Services: Cleanings, fillings and simple extractions. Payment Options: DENTAL WORK IS FREE OF CHARGE. Bring proof of income or support. Best way to get seen: Arrive at 5:15 pm - this is a lottery, NOT first come/first serve, so arriving earlier will not increase your chances of being seen.     [facility] [phone] Select option 1 for emergencies   Location: [facility], [address], [place] Clinic Hours: No walk-ins accepted - call the day before to schedule an appointment. Check in times are 9:30 am and 1:30 pm. Services: Simple extractions, temporary fillings, pulpectomy/pulp debridement, uncomplicated abscess drainage. Payment Options: PAYMENT IS DUE AT THE TIME OF SERVICE.  Fee is usually $100-200, additional surgical procedures (e.g. abscess drainage) may be extra. Cash, checks, Visa/MasterCard accepted.  Can file Medicaid if patient is covered for dental - patient should call case worker to check. No discount for [facility] patients. Best way to get seen: MUST call the day before and get onto the schedule. Can usually be seen the next 1-2 days. No walk-ins accepted.      [facility] [phone]   Location: [facility], [address], [place] Clinic Hours: M, W, Th, F 8am or 1:30pm, Tues 9a or 1:30 - first come/first served. Services: Simple extractions, temporary fillings, uncomplicated abscess drainage.  You do not need to be an [place] resident. Payment Options: PAYMENT IS DUE AT THE TIME OF SERVICE. Dental insurance, otherwise sliding scale - bring proof of income or support. Depending on income and treatment needed, cost is usually $50-200. Best way to get seen: Arrive early as it is first come/first served.     [facility] [phone]   Location: [address] Clinic Hours: Mon-Thu 8a-5p Services: Most basic dental services including extractions and fillings. Payment Options: PAYMENT IS DUE AT THE TIME OF SERVICE. Sliding scale, up to 50% off - bring proof if income or support. Medicaid with dental option accepted. Best way to get seen: Call to schedule an appointment, can usually be seen within 2 weeks OR they will try to see walk-ins - show up at 8a or 2p (you may have to wait).     [facility] [phone] [place] RESIDENTS ONLY   Location: [facility], [address], [place], [place] [address] Clinic Hours: By appointment only. Monday - Thursday 8am-5pm, Friday 8am-12pm Services: Cleanings, fillings, extractions. Payment Options: PAYMENT IS DUE AT THE TIME OF SERVICE. Cash, Visa or MasterCard. Sliding scale - $30 minimum per service. Best way to get seen: Come in to office, complete packet and make an appointment - need proof of income or support monies for each household member and proof of [place] residence. Usually takes about a month to get in.     [facility]   [facility] Clinic [phone]   Location: [address] [place] Clinic Hours: Walk-in Urgent Care  Dental Services are offered Monday-Friday mornings only. The numbers of emergencies accepted daily is limited to the number of providers available. Maximum 15 - Mondays, Wednesdays & Thursdays Maximum 10 - Tuesdays & Fridays Services: You do not need to be a [place] resident to be seen for a dental emergency. Emergencies are defined as pain, swelling, abnormal bleeding, or dental trauma. Walkins will receive x-rays if needed. NOTE: Dental cleaning is not an emergency. Payment Options: PAYMENT IS DUE AT THE TIME OF SERVICE. Minimum co-pay is $40.00 for uninsured patients. Minimum co-pay is $3.00 for Medicaid with dental coverage. Dental Insurance is accepted and must be presented at time of visit. Medicare does not cover dental. Forms of payment: Cash, credit card, checks. Best way to get seen: If not previously registered with the clinic, walk-in dental registration begins at 7:15 am and is on a first come/first serve basis. If previously registered with the clinic, call to make an appointment.     [facility] [phone] [place] RESIDENTS ONLY   Location: [address], [place], [address] Clinic Hours: Mon-Thu 10a-2p Services: Extractions only! Payment Options: FREE (donations accepted) - bring proof of income or support Best way to get seen: Call and schedule an appointment OR come at 8am on the 1st Monday of every month (except for holidays) when it is first come/first served.     [facility] [phone]   Location: [address], [place] Clinic Hours: Friday mornings Services, Payment Options, Best way to get seen: Call for info  

## 2021-02-07 NOTE — ED Triage Notes (Signed)
Pt with broken upper left back molar x 4 days ago. Pt states it is poking her mouth and is red and irritated and sometimes has a funny taste in her mouth. Pt states she has been unable to see a dentist. Pt states dizziness started this morning. Pt denies N/V. Pt states she has not been eating and drinking normally due to pain. Pt states her left  jaw feels stiff.

## 2021-02-16 ENCOUNTER — Other Ambulatory Visit: Payer: Self-pay

## 2021-02-16 ENCOUNTER — Encounter: Payer: Self-pay | Admitting: *Deleted

## 2021-02-16 ENCOUNTER — Emergency Department
Admission: EM | Admit: 2021-02-16 | Discharge: 2021-02-16 | Disposition: A | Discharge status: Home / Self Care | Payer: Medicaid Other | Attending: Emergency Medicine | Admitting: Emergency Medicine

## 2021-02-16 DIAGNOSIS — Z87891 Personal history of nicotine dependence: Secondary | ICD-10-CM | POA: Insufficient documentation

## 2021-02-16 DIAGNOSIS — K0889 Other specified disorders of teeth and supporting structures: Secondary | ICD-10-CM | POA: Diagnosis present

## 2021-02-16 DIAGNOSIS — K029 Dental caries, unspecified: Secondary | ICD-10-CM | POA: Diagnosis not present

## 2021-02-16 MED ORDER — CLINDAMYCIN HCL 150 MG PO CAPS
300.0000 mg | ORAL_CAPSULE | Freq: Once | ORAL | Status: AC
  Administered 2021-02-16: 300 mg via ORAL
  Filled 2021-02-16: qty 2

## 2021-02-16 MED ORDER — LIDOCAINE VISCOUS HCL 2 % MT SOLN: 15.0000 mL | OROMUCOSAL | 0 refills | Status: DC | PRN

## 2021-02-16 MED ORDER — CLINDAMYCIN HCL 300 MG PO CAPS: 300.0000 mg | ORAL_CAPSULE | Freq: Three times a day (TID) | ORAL | 0 refills | Status: AC

## 2021-02-16 MED ORDER — LIDOCAINE VISCOUS HCL 2 % MT SOLN
15.0000 mL | Freq: Once | OROMUCOSAL | Status: AC
  Administered 2021-02-16: 15 mL via OROMUCOSAL
  Filled 2021-02-16: qty 15

## 2021-02-16 NOTE — Discharge Instructions (Addendum)
OPTIONS FOR DENTAL FOLLOW UP CARE ° °[place] Department of Health and Human Services - Local Safety Net Dental Clinics °http://www.ncdhhs.gov/dph/oralhealth/services/safetynetclinics.htm °  °[facility] ([phone]) ° °[facility] ([phone]) ° °[facility] ([phone]) ° °[facility] ([phone]) ° °[facility] ([phone]) °This clinic caters to the indigent population and is on a lottery system. °Location: °[facility], [address], [place] °Clinic Hours: °Wednesdays from 6pm - 9pm, patients seen by a lottery system. °For dates, call or go to [email or web address] °Services: °Cleanings, fillings and simple extractions. °Payment Options: °DENTAL WORK IS FREE OF CHARGE. Bring proof of income or support. °Best way to get seen: °Arrive at 5:15 pm - this is a lottery, NOT first come/first serve, so arriving earlier will not increase your chances of being seen. °  °  °[facility] °[phone] °Select option 1 for emergencies °  °Location: °[facility], [address], [place] °Clinic Hours: °No walk-ins accepted - call the day before to schedule an appointment. °Check in times are 9:30 am and 1:30 pm. °Services: °Simple extractions, temporary fillings, pulpectomy/pulp debridement, uncomplicated abscess drainage. °Payment Options: °PAYMENT IS DUE AT THE TIME OF SERVICE.  Fee is usually $100-200, additional surgical procedures (e.g. abscess drainage) may be extra. °Cash, checks, Visa/MasterCard accepted.  Can file Medicaid if patient is covered for dental - patient should call case worker to check. °No discount for [facility] patients. °Best way to get seen: °MUST call the day before and get onto the schedule. Can usually be seen the next 1-2 days. No walk-ins accepted. °  °  °[facility] °[phone] °   °Location: °[facility], [address], [place] °Clinic Hours: °M, W, Th, F 8am or 1:30pm, Tues 9a or 1:30 - first come/first served. °Services: °Simple extractions, temporary fillings, uncomplicated abscess drainage.  You do not need to be an [place] resident. °Payment Options: °PAYMENT IS DUE AT THE TIME OF SERVICE. °Dental insurance, otherwise sliding scale - bring proof of income or support. °Depending on income and treatment needed, cost is usually $50-200. °Best way to get seen: °Arrive early as it is first come/first served. °  °  °[facility] °[phone] °  °Location: °[address] °Clinic Hours: °Mon-Thu 8a-5p °Services: °Most basic dental services including extractions and fillings. °Payment Options: °PAYMENT IS DUE AT THE TIME OF SERVICE. °Sliding scale, up to 50% off - bring proof if income or support. °Medicaid with dental option accepted. °Best way to get seen: °Call to schedule an appointment, can usually be seen within 2 weeks OR they will try to see walk-ins - show up at 8a or 2p (you may have to wait). °  °  °[facility] °[phone] °[place] RESIDENTS ONLY °  °Location: °[facility], [address], [place], [place] [address] °Clinic Hours: By appointment only. °Monday - Thursday 8am-5pm, Friday 8am-12pm °Services: Cleanings, fillings, extractions. °Payment Options: °PAYMENT IS DUE AT THE TIME OF SERVICE. °Cash, Visa or MasterCard. Sliding scale - $30 minimum per service. °Best way to get seen: °Come in to office, complete packet and make an appointment - need proof of income °or support monies for each household member and proof of [place] residence. °Usually takes about a month to get in. °  °  °[facility] °[phone] °  °Location: °[address]   [place] °Clinic Hours: Walk-in Urgent Care Dental Services are offered Monday-Friday  mornings only. °The numbers of emergencies accepted daily is limited to the number of °providers available. °Maximum 15 - Mondays, Wednesdays & Thursdays °Maximum 10 - Tuesdays & Fridays °Services: °You do not need to be a [place] resident to be seen for a dental emergency. °Emergencies are defined as pain, swelling, abnormal bleeding, or dental trauma. Walkins will receive x-rays if needed. °NOTE: Dental cleaning is not an emergency. °Payment Options: °PAYMENT IS DUE AT THE TIME OF SERVICE. °Minimum co-pay is $40.00 for uninsured patients. °Minimum co-pay is $3.00 for Medicaid with dental coverage. °Dental Insurance is accepted and must be presented at time of visit. °Medicare does not cover dental. °Forms of payment: Cash, credit card, checks. °Best way to get seen: °If not previously registered with the clinic, walk-in dental registration begins at 7:15 am and is on a first come/first serve basis. °If previously registered with the clinic, call to make an appointment. °  °  °[facility] °[phone] °[place] RESIDENTS ONLY °  °Location: °[address], [place], [place] °Clinic Hours: °Mon-Thu 10a-2p °Services: Extractions only! °Payment Options: °FREE (donations accepted) - bring proof of income or support °Best way to get seen: °Call and schedule an appointment OR come at 8am on the 1st Monday of every month (except for holidays) when it is first come/first served. °  °  °[facility] °[phone] °  °Location: °[address], [place] °Clinic Hours: °Friday mornings °Services, Payment Options, Best way to get seen: °Call for info °

## 2021-02-16 NOTE — ED Provider Notes (Signed)
[facility] Emergency Department Provider Note  ____________________________________________   Event Date/Time   First MD Initiated Contact with Patient 02/16/21 2150     (approximate)  I have reviewed the triage vital signs and the nursing notes.   HISTORY  Chief Complaint Facial Pain  HPI Lisa House is a 30 y.o. female who presents to the emergency department for evaluation of persistence and increase in dental pain.  Patient states that she has had left-sided pain at the site of an impacted wisdom tooth for "a while" but it recently worsened a few weeks ago.  She was seen in the ER and started on amoxicillin, and she took this course of medication, only has 1 tablet left for dose today.  She states that initially it helped a lot, but has now been failing.  She states that now she has pain across both cheek areas, but denies any swelling, redness, fever, chills or other systemic symptoms.  She denies any pain under the tongue or under the chin.  She states that she has been "overusing" Tylenol and ibuprofen over the last several weeks due to the pain.  When asked to quantify how much Tylenol and ibuprofen she has been using, she states that she has been using 600 mg of ibuprofen approximately every 4-5 hours during the waking day.  She states that she is using 2 extra strength Tylenol approximately in the same time window.         Past Medical History:  Diagnosis Date  . Allergy   . Depression   . Eczema   . GERD (gastroesophageal reflux disease)   . Intertrigo   . Obesity   . OCD (obsessive compulsive disorder)   . Panic attack     Patient Active Problem List   Diagnosis Date Noted  . Lumbar radiculopathy 04/27/2020  . Neck pain 02/14/2020  . Right ovarian cyst 08/16/2019  . Family history of congenital aortic stenosis 09/23/2017  . Herpes genitalis 02/08/2017  . IBS (irritable bowel syndrome) 05/09/2015  . Morbid obesity (HCC) 05/09/2015   . OCD (obsessive compulsive disorder) 05/09/2015  . Bilateral polycystic ovarian syndrome 05/09/2015  . Psoriasis 05/09/2015  . History of depression 05/09/2015  . Family history of congenital heart defect 08/08/2014  . Allergic rhinitis 05/02/2010    Past Surgical History:  Procedure Laterality Date  . CHOLECYSTECTOMY  01/09/2011   Dr. Excell Seltzer    Prior to Admission medications   Medication Sig Start Date End Date Taking? Authorizing Provider  clindamycin (CLEOCIN) 300 MG capsule Take 1 capsule (300 mg total) by mouth 3 (three) times daily for 7 days. 02/16/21 02/23/21 Yes Breeze Angell, Ruben Gottron, PA  lidocaine (XYLOCAINE) 2 % solution Use as directed 15 mLs in the mouth or throat as needed for mouth pain. 02/16/21  Yes Lucy Chris, PA  albuterol (VENTOLIN HFA) 108 (90 Base) MCG/ACT inhaler Inhale 2 puffs into the lungs every 4 (four) hours as needed for wheezing or shortness of breath. 05/04/20   Danelle Berry, PA-C  amoxicillin (AMOXIL) 875 MG tablet Take 1 tablet (875 mg total) by mouth 2 (two) times daily for 10 days. 02/07/21 02/17/21  Orvil Feil, PA-C  butalbital-acetaminophen-caffeine (FIORICET) [phone] MG tablet Take 1 tablet by mouth every 6 (six) hours as needed for headache. 07/21/20 07/21/21  Loleta Rose, MD  cyclobenzaprine (FLEXERIL) 10 MG tablet Take 10 mg by mouth 3 (three) times daily as needed for muscle spasms.    [provider]  HYDROcodone-acetaminophen (NORCO/VICODIN) 5-325 MG tablet Take 1 tablet by mouth every 6 (six) hours as needed for moderate pain.    [provider]  levonorgestrel (MIRENA, 52 MG,) 20 MCG/24HR IUD 1 Intra Uterine Device (1 each total) by Intrauterine route once for 1 dose. 06/15/18 06/15/18  [name], MD  pimecrolimus (ELIDEL) 1 % cream APPLY EXTERNALLY TO THE AFFECTED AREA TWICE DAILY 05/12/19   Carlynn Purl, Danna Hefty, MD  triamcinolone cream (KENALOG) 0.1 % APPLY EXTERNALLY TO THE AFFECTED AREA TWICE DAILY 04/26/20   Carlynn Purl,  Danna Hefty, MD  valACYclovir (VALTREX) 500 MG tablet Take 1 tablet (500 mg total) by mouth 2 (two) times daily as needed. Prn outbreak x 3 days 08/16/19   Copland, Ilona Sorrel, PA-C    Allergies Patient has no known allergies.  Family History  Problem Relation Age of Onset  . Hypertension Mother   . Diabetes Mother   . Headache Mother   . Hypertension Father   . Lung cancer Father   . Asthma Father   . Heart disease Son        Aortic Valve Disorder Stenosis-had balloon valvuloplasty, on meds  . Ovarian cancer Maternal Grandmother   . Colon cancer Maternal Grandfather     Social History Social History   Tobacco Use  . Smoking status: Former Games developer  . Smokeless tobacco: Never Used  Vaping Use  . Vaping Use: Never used  Substance Use Topics  . Alcohol use: No    Alcohol/week: 0.0 standard drinks  . Drug use: No    Review of Systems Constitutional: No fever/chills Eyes: No visual changes. ENT: + Dental pain, no sore throat. Cardiovascular: Denies chest pain. Respiratory: Denies shortness of breath. Gastrointestinal: No abdominal pain.  No nausea, no vomiting.  No diarrhea.  No constipation. Genitourinary: Negative for dysuria. Musculoskeletal: Negative for back pain. Skin: Negative for rash. Neurological: Negative for headaches, focal weakness or numbness.  ____________________________________________   PHYSICAL EXAM:  VITAL SIGNS: ED Triage Vitals  Enc Vitals Group     BP 02/16/21 2139 114/61     Pulse Rate 02/16/21 2139 80     Resp 02/16/21 2139 16     Temp 02/16/21 2139 97.8 F (36.6 C)     Temp Source 02/16/21 2139 Oral     SpO2 02/16/21 2139 100 %     Weight 02/16/21 2142 245 lb (111.1 kg)     Height 02/16/21 2142 5\' 5"  (1.651 m)     Head Circumference --      Peak Flow --      Pain Score 02/16/21 2141 3     Pain Loc --      Pain Edu? --      Excl. in GC? --    Constitutional: Alert and oriented. Well appearing and in no acute distress. Eyes:  Conjunctivae are normal. PERRL. EOMI. Head: Atraumatic. Nose: No congestion/rhinnorhea. Ears: TMs visualized, pearly gray with appropriate cone of light, no bulging or erythema. Mouth/Throat: Multiple dental caries and broken teeth noted throughout the oropharynx, there is an impacted wisdom tooth on the upper left.  No appreciable periapical abscesses.  No pain to palpation under the tongue. Neck: No stridor.   Cardiovascular: Normal rate, regular rhythm. Grossly normal heart sounds.  Good peripheral circulation. Respiratory: Normal respiratory effort.  No retractions. Lungs CTAB. Gastrointestinal: Soft and nontender. No distention. No abdominal bruits. No CVA tenderness. Musculoskeletal: No lower extremity tenderness nor edema.  No joint effusions. Neurologic:  Normal speech and language. No gross  focal neurologic deficits are appreciated. No gait instability. Skin:  Skin is warm, dry and intact. No rash noted. Psychiatric: Mood and affect are normal. Speech and behavior are normal.  ____________________________________________   INITIAL IMPRESSION / ASSESSMENT AND PLAN / ED COURSE  As part of my medical decision making, I reviewed the following data within the electronic MEDICAL RECORD NUMBER Nursing notes reviewed and incorporated and Notes from prior ED visits        Patient is a 30 year old female who presents to the emergency department for evaluation of persistent dental pain with initial improvement on amoxicillin and then worsening again.  She does not have any systemic symptoms of fever, chills.  On physical exam, the patient does have multiple dental caries and broken teeth throughout the oropharynx.  No periapical abscess is identified.  We will switch the patient to trial of clindamycin.  She was warned of possible GI side effects from this medication.  However, since she had initial improvement on antibiotic, still have suspicion for infectious cause.  We will also trial topical  lidocaine solution for pain.  Patient was encouraged to have close dental follow-up, and she states that she is in the process of establishing this.  Local dental clinics were provided.  Patient is stable at this time for outpatient management, return precautions were discussed.      ____________________________________________   FINAL CLINICAL IMPRESSION(S) / ED DIAGNOSES  Final diagnoses:  Pain due to dental caries     ED Discharge Orders         Ordered    clindamycin (CLEOCIN) 300 MG capsule  3 times daily        02/16/21 2216    lidocaine (XYLOCAINE) 2 % solution  As needed        02/16/21 2216          *Please note:  Lisa House was evaluated in Emergency Department on 02/17/2021 for the symptoms described in the history of present illness. She was evaluated in the context of the global COVID-19 pandemic, which necessitated consideration that the patient might be at risk for infection with the SARS-CoV-2 virus that causes COVID-19. Institutional protocols and algorithms that pertain to the evaluation of patients at risk for COVID-19 are in a state of rapid change based on information released by regulatory bodies including the CDC and federal and state organizations. These policies and algorithms were followed during the patient's care in the ED.  Some ED evaluations and interventions may be delayed as a result of limited staffing during and the pandemic.*   Note:  This document was prepared using Dragon voice recognition software and may include unintentional dictation errors.   Lucy Chris, PA 02/17/21 1545    Phineas Semen, MD 02/17/21 [id number]

## 2021-02-16 NOTE — ED Triage Notes (Signed)
Per patient's report, patient has left-side dental pain for awhile. Patient is c/o pain on both sides of face in front of the ears.

## 2021-02-19 ENCOUNTER — Other Ambulatory Visit: Payer: Self-pay

## 2021-02-19 ENCOUNTER — Emergency Department: Payer: Medicaid Other

## 2021-02-19 ENCOUNTER — Emergency Department
Admission: EM | Admit: 2021-02-19 | Discharge: 2021-02-19 | Disposition: A | Discharge status: Home / Self Care | Payer: Medicaid Other | Attending: Emergency Medicine | Admitting: Emergency Medicine

## 2021-02-19 DIAGNOSIS — R0789 Other chest pain: Secondary | ICD-10-CM | POA: Diagnosis present

## 2021-02-19 DIAGNOSIS — J40 Bronchitis, not specified as acute or chronic: Secondary | ICD-10-CM | POA: Diagnosis not present

## 2021-02-19 LAB — CBC
HCT: 38.4 % (ref 36.0–46.0)
Hemoglobin: 13.1 g/dL (ref 12.0–15.0)
MCH: 27.8 pg (ref 26.0–34.0)
MCHC: 34.1 g/dL (ref 30.0–36.0)
MCV: 81.4 fL (ref 80.0–100.0)
Platelets: 200 10*3/uL (ref 150–400)
RBC: 4.72 MIL/uL (ref 3.87–5.11)
RDW: 13.8 % (ref 11.5–15.5)
WBC: 8.1 10*3/uL (ref 4.0–10.5)
nRBC: 0 % (ref 0.0–0.2)

## 2021-02-19 LAB — BASIC METABOLIC PANEL
Anion gap: 5 (ref 5–15)
BUN: 11 mg/dL (ref 6–20)
CO2: 27 mmol/L (ref 22–32)
Calcium: 8.8 mg/dL — ABNORMAL LOW (ref 8.9–10.3)
Chloride: 104 mmol/L (ref 98–111)
Creatinine, Ser: 0.78 mg/dL (ref 0.44–1.00)
GFR, Estimated: >60 mL/min (ref >60)
Glucose, Bld: 98 mg/dL (ref 70–99)
Potassium: 4.1 mmol/L (ref 3.5–5.1)
Sodium: 136 mmol/L (ref 135–145)

## 2021-02-19 LAB — TSH: TSH: 1.895 u[IU]/mL (ref 0.350–4.500)

## 2021-02-19 LAB — T4, FREE: Free T4: 0.91 ng/dL (ref 0.61–1.12)

## 2021-02-19 LAB — TROPONIN I (HIGH SENSITIVITY): Troponin I (High Sensitivity): <2 ng/L (ref <18)

## 2021-02-19 LAB — POC URINE PREG, ED: Preg Test, Ur: NEGATIVE

## 2021-02-19 MED ORDER — DOXYCYCLINE HYCLATE 100 MG PO CAPS: 100.0000 mg | ORAL_CAPSULE | Freq: Two times a day (BID) | ORAL | 0 refills | Status: AC

## 2021-02-19 MED ORDER — ALBUTEROL SULFATE HFA 108 (90 BASE) MCG/ACT IN AERS: 2.0000 | INHALATION_SPRAY | RESPIRATORY_TRACT | 0 refills | Status: DC | PRN

## 2021-02-19 MED ORDER — PREDNISONE 10 MG PO TABS: ORAL_TABLET | ORAL | 0 refills | Status: DC

## 2021-02-19 NOTE — ED Notes (Signed)
EDP at bedside  

## 2021-02-19 NOTE — ED Provider Notes (Signed)
[facility] Emergency Department Provider Note  ____________________________________________  Time seen: Approximately 11:42 AM  I have reviewed the triage vital signs and the nursing notes.   HISTORY  Chief Complaint Chest Pain and Leg Swelling    HPI Lisa House is a 30 y.o. female with a past history of GERD, anxiety, obesity who comes ED complaining of chest tightness since yesterday, with associated leg swelling.  Pain is moderate intensity, nonradiating, no aggravating or alleviating factors.  Not exertional, not pleuritic.  No diaphoresis or vomiting.  No palpitations or syncope.  Pain is constant.  No recent injuries.  No orthopnea or PND.  Does complain of some jaw pain related to a dental issue that she is seeing dentistry tomorrow.      Past Medical History:  Diagnosis Date  . Allergy   . Depression   . Eczema   . GERD (gastroesophageal reflux disease)   . Intertrigo   . Obesity   . OCD (obsessive compulsive disorder)   . Panic attack      Patient Active Problem List   Diagnosis Date Noted  . Lumbar radiculopathy 04/27/2020  . Neck pain 02/14/2020  . Right ovarian cyst 08/16/2019  . Family history of congenital aortic stenosis 09/23/2017  . Herpes genitalis 02/08/2017  . IBS (irritable bowel syndrome) 05/09/2015  . Morbid obesity (HCC) 05/09/2015  . OCD (obsessive compulsive disorder) 05/09/2015  . Bilateral polycystic ovarian syndrome 05/09/2015  . Psoriasis 05/09/2015  . History of depression 05/09/2015  . Family history of congenital heart defect 08/08/2014  . Allergic rhinitis 05/02/2010     Past Surgical History:  Procedure Laterality Date  . CHOLECYSTECTOMY  01/09/2011   Dr. Excell Seltzer     Prior to Admission medications   Medication Sig Start Date End Date Taking? Authorizing Provider  albuterol (PROVENTIL HFA) 108 (90 Base) MCG/ACT inhaler Inhale 2 puffs into the lungs every 4 (four) hours as needed for wheezing  or shortness of breath. 02/19/21  Yes Sharman Cheek, MD  doxycycline (VIBRAMYCIN) 100 MG capsule Take 1 capsule (100 mg total) by mouth 2 (two) times daily for 7 days. 02/19/21 02/26/21 Yes Sharman Cheek, MD  predniSONE (DELTASONE) 10 MG tablet Take 5 tablets (50 mg total) by mouth daily for 3 days, THEN 4 tablets (40 mg total) daily for 3 days, THEN 3 tablets (30 mg total) daily for 3 days, THEN 2 tablets (20 mg total) daily for 3 days, THEN 1 tablet (10 mg total) daily for 3 days. 02/19/21 03/06/21 Yes Sharman Cheek, MD  albuterol (VENTOLIN HFA) 108 (90 Base) MCG/ACT inhaler Inhale 2 puffs into the lungs every 4 (four) hours as needed for wheezing or shortness of breath. 05/04/20   Danelle Berry, PA-C  butalbital-acetaminophen-caffeine (FIORICET) [phone] MG tablet Take 1 tablet by mouth every 6 (six) hours as needed for headache. 07/21/20 07/21/21  Loleta Rose, MD  clindamycin (CLEOCIN) 300 MG capsule Take 1 capsule (300 mg total) by mouth 3 (three) times daily for 7 days. 02/16/21 02/23/21  Lucy Chris, PA  cyclobenzaprine (FLEXERIL) 10 MG tablet Take 10 mg by mouth 3 (three) times daily as needed for muscle spasms.    [provider]  HYDROcodone-acetaminophen (NORCO/VICODIN) 5-325 MG tablet Take 1 tablet by mouth every 6 (six) hours as needed for moderate pain.    [provider]  levonorgestrel (MIRENA, 52 MG,) 20 MCG/24HR IUD 1 Intra Uterine Device (1 each total) by Intrauterine route once for 1 dose. 06/15/18 06/15/18  Alba CorySowles, Krichna, MD  lidocaine (XYLOCAINE) 2 % solution Use as directed 15 mLs in the mouth or throat as needed for mouth pain. 02/16/21   Lucy Chrisodgers, Caitlin J, PA  pimecrolimus (ELIDEL) 1 % cream APPLY EXTERNALLY TO THE AFFECTED AREA TWICE DAILY 05/12/19   Carlynn PurlSowles, Danna HeftyKrichna, MD  triamcinolone cream (KENALOG) 0.1 % APPLY EXTERNALLY TO THE AFFECTED AREA TWICE DAILY 04/26/20   Carlynn PurlSowles, Danna HeftyKrichna, MD  valACYclovir (VALTREX) 500 MG tablet Take 1 tablet (500 mg total) by  mouth 2 (two) times daily as needed. Prn outbreak x 3 days 08/16/19   Copland, Ilona SorrelAlicia B, PA-C     Allergies Patient has no known allergies.   Family History  Problem Relation Age of Onset  . Hypertension Mother   . Diabetes Mother   . Headache Mother   . Hypertension Father   . Lung cancer Father   . Asthma Father   . Heart disease Son        Aortic Valve Disorder Stenosis-had balloon valvuloplasty, on meds  . Ovarian cancer Maternal Grandmother   . Colon cancer Maternal Grandfather     Social History Social History   Tobacco Use  . Smoking status: Former Games developermoker  . Smokeless tobacco: Never Used  Vaping Use  . Vaping Use: Never used  Substance Use Topics  . Alcohol use: No    Alcohol/week: 0.0 standard drinks  . Drug use: No    Review of Systems  Constitutional:   No fever or chills.  ENT:   No sore throat. No rhinorrhea. Cardiovascular: Positive chest pain as above without syncope. Respiratory:   No dyspnea or cough. Gastrointestinal:   Negative for abdominal pain, vomiting and diarrhea.  Musculoskeletal:   Negative for focal pain or swelling All other systems reviewed and are negative except as documented above in ROS and HPI.  ____________________________________________   PHYSICAL EXAM:  VITAL SIGNS: ED Triage Vitals  Enc Vitals Group     BP 02/19/21 1001 125/78     Pulse Rate 02/19/21 1001 74     Resp 02/19/21 1001 18     Temp 02/19/21 1001 97.8 F (36.6 C)     Temp Source 02/19/21 1001 Oral     SpO2 02/19/21 1001 96 %     Weight 02/19/21 1000 245 lb (111.1 kg)     Height 02/19/21 1000 5\' 5"  (1.651 m)     Head Circumference --      Peak Flow --      Pain Score 02/19/21 1000 4     Pain Loc --      Pain Edu? --      Excl. in GC? --     Vital signs reviewed, nursing assessments reviewed.   Constitutional:   Alert and oriented. Non-toxic appearance. Eyes:   Conjunctivae are normal. EOMI. PERRL. ENT      Head:   Normocephalic and  atraumatic.      Nose:   Wearing a mask.      Mouth/Throat:   Wearing a mask.      Neck:   No meningismus. Full ROM. Hematological/Lymphatic/Immunilogical:   No cervical lymphadenopathy. Cardiovascular:   RRR. Symmetric bilateral radial and DP pulses.  No murmurs. Cap refill less than 2 seconds. Respiratory:   Normal respiratory effort without tachypnea/retractions.  There is mild end expiratory wheezing.  No focal crackles. Gastrointestinal:   Soft and nontender. Non distended. There is no CVA tenderness.  No rebound, rigidity, or guarding.  Musculoskeletal:   Normal range  of motion in all extremities. No joint effusions.  No lower extremity tenderness.  No pitting edema. Neurologic:   Normal speech and language.  Motor grossly intact. No acute focal neurologic deficits are appreciated.  Skin:    Skin is warm, dry and intact. No rash noted.  No petechiae, purpura, or bullae.  ____________________________________________    LABS (pertinent positives/negatives) (all labs ordered are listed, but only abnormal results are displayed) Labs Reviewed  BASIC METABOLIC PANEL - Abnormal; Notable for the following components:      Result Value   Calcium 8.8 (*)    All other components within normal limits  CBC  TSH  T4, FREE  POC URINE PREG, ED  TROPONIN I (HIGH SENSITIVITY)  TROPONIN I (HIGH SENSITIVITY)   ____________________________________________   EKG  Interpreted by me  Date: 02/19/2021  Rate: 65  Rhythm: normal sinus rhythm  QRS Axis: normal  Intervals: normal  ST/T Wave abnormalities: normal  Conduction Disutrbances: none  Narrative Interpretation: unremarkable      ____________________________________________    RADIOLOGY  DG Chest 2 View  Result Date: 02/19/2021 CLINICAL DATA:  Chest pain. EXAM: CHEST - 2 VIEW COMPARISON:  December 29, 2020. FINDINGS: The heart size and mediastinal contours are within normal limits. Both lungs are clear. No pneumothorax or  pleural effusion is noted. The visualized skeletal structures are unremarkable. IMPRESSION: No active cardiopulmonary disease. Electronically Signed   By: Lupita Raider M.D.   On: 02/19/2021 10:43    ____________________________________________   PROCEDURES Procedures  ____________________________________________  DIFFERENTIAL DIAGNOSIS   Pneumonia, asthma/bronchitis, pleural effusion, hypothyroidism, myocarditis  CLINICAL IMPRESSION / ASSESSMENT AND PLAN / ED COURSE  Medications ordered in the ED: Medications - No data to display  Pertinent labs & imaging results that were available during my care of the patient were reviewed by me and considered in my medical decision making (see chart for details).  TIMIA CASSELMAN was evaluated in Emergency Department on 02/19/2021 for the symptoms described in the history of present illness. She was evaluated in the context of the global COVID-19 pandemic, which necessitated consideration that the patient might be at risk for infection with the SARS-CoV-2 virus that causes COVID-19. Institutional protocols and algorithms that pertain to the evaluation of patients at risk for COVID-19 are in a state of rapid change based on information released by regulatory bodies including the CDC and federal and state organizations. These policies and algorithms were followed during the patient's care in the ED.   Patient presents with atypical chest discomfort, some wheezing, consistent with bronchospasm.  Vital signs are normal, not hypoxic, no respiratory distress.  Labs are normal, EKG is normal.  Will treat with steroid taper, doxycycline, albuterol. Considering the patient's symptoms, medical history, and physical examination today, I have low suspicion for ACS, PE, TAD, pneumothorax, carditis, mediastinitis, pneumonia, CHF, or sepsis.        ____________________________________________   FINAL CLINICAL IMPRESSION(S) / ED DIAGNOSES    Final  diagnoses:  Bronchitis     ED Discharge Orders         Ordered    predniSONE (DELTASONE) 10 MG tablet        02/19/21 1141    albuterol (PROVENTIL HFA) 108 (90 Base) MCG/ACT inhaler  Every 4 hours PRN        02/19/21 1141    doxycycline (VIBRAMYCIN) 100 MG capsule  2 times daily        02/19/21 1141  Portions of this note were generated with dragon dictation software. Dictation errors may occur despite best attempts at proofreading.   Sharman Cheek, MD 02/19/21 1147

## 2021-02-19 NOTE — ED Triage Notes (Signed)
Pt arrives to ER for cental CP and bilat leg swelling that began yesterday. Denies cough, congestion. Denies CHF. A&o,ambulatory.

## 2021-02-19 NOTE — Discharge Instructions (Signed)
Your chest x-ray and lab tests were all okay.

## 2021-02-19 NOTE — ED Notes (Signed)
Pt to xray

## 2021-02-19 NOTE — ED Notes (Signed)
Reports bilateral leg swelling over last few days, cp that started last night.  Bilateral upper jaw pain, recent chipped tooth. Headache x 6 mo.

## 2021-03-04 NOTE — Progress Notes (Signed)
Name: Lisa House   MRN: [id number]    DOB: October 14, 1991   Date:03/05/2021       Progress Note  Subjective  Chief Complaint  Migraines  I connected with  Lisa House  on 03/05/21 at 11:00 AM EDT by a video enabled telemedicine application and verified that I am speaking with the correct person using two identifiers.  I discussed the limitations of evaluation and management by telemedicine and the availability of in person appointments. The patient expressed understanding and agreed to proceed with the virtual visit  Staff also discussed with the patient that there may be a patient responsible charge related to this service. Patient Location: at home  Provider Location: [facility] Additional Individuals present: alone   HPI  Recurrent Headaches: she has a history of headaches and had MRI and MRA done back in 2018 that was negative. She states over the past 6 months she noticed recurrent of headaches, described as dull, frontal and sometimes nuchal, usually wakes up feeling well but gets worse by the end of the day. She has noticed some nausea but not necessarily associated with headaches. The pain can go up to 8/10 but improves with Tylenol or Ibuprofen that she has been taking daily She also has been drinking one soda daily otherwise the symptoms are worse. She denies focal deficit, not waking up during the night.   Discussed rebound headaches and caffeine withdrawal headaches, advised to try not to take NSAID's , tylenol and cut down on caffeine intake. Drink more water and try Elavil at night. She has an IUD   Patient Active Problem List   Diagnosis Date Noted  . Lumbar radiculopathy 04/27/2020  . Neck pain 02/14/2020  . Right ovarian cyst 08/16/2019  . Family history of congenital aortic stenosis 09/23/2017  . Herpes genitalis 02/08/2017  . IBS (irritable bowel syndrome) 05/09/2015  . Morbid obesity (HCC) 05/09/2015  . OCD (obsessive compulsive disorder) 05/09/2015  . Bilateral  polycystic ovarian syndrome 05/09/2015  . Psoriasis 05/09/2015  . History of depression 05/09/2015  . Family history of congenital heart defect 08/08/2014  . Allergic rhinitis 05/02/2010    Past Surgical History:  Procedure Laterality Date  . CHOLECYSTECTOMY  01/09/2011   Dr. Excell Seltzer    Family History  Problem Relation Age of Onset  . Hypertension Mother   . Diabetes Mother   . Headache Mother   . Hypertension Father   . Lung cancer Father   . Asthma Father   . Heart disease Son        Aortic Valve Disorder Stenosis-had balloon valvuloplasty, on meds  . Ovarian cancer Maternal Grandmother   . Colon cancer Maternal Grandfather     Social History   Socioeconomic History  . Marital status: Single    Spouse name: Not on file  . Number of children: 3  . Years of education: high school  . Highest education level: High school graduate  Occupational History    Employer: UNEMPLOYED  Tobacco Use  . Smoking status: Former Games developer  . Smokeless tobacco: Never Used  Vaping Use  . Vaping Use: Never used  Substance and Sexual Activity  . Alcohol use: No    Alcohol/week: 0.0 standard drinks  . Drug use: No  . Sexual activity: Yes    Partners: Male    Birth control/protection: I.U.D.    Comment: Mirena  Other Topics Concern  . Not on file  Social History Narrative  . Not on file   Social  Determinants of Health   Financial Resource Strain: Not on file  Food Insecurity: Not on file  Transportation Needs: Not on file  Physical Activity: Not on file  Stress: Not on file  Social Connections: Not on file  Intimate Partner Violence: Not on file     Current Outpatient Medications:  .  albuterol (PROVENTIL HFA) 108 (90 Base) MCG/ACT inhaler, Inhale 2 puffs into the lungs every 4 (four) hours as needed for wheezing or shortness of breath., Disp: 1 each, Rfl: 0 .  amitriptyline (ELAVIL) 10 MG tablet, Take 1-2 tablets (10-20 mg total) by mouth at bedtime., Disp: 60 tablet, Rfl:  0 .  baclofen (LIORESAL) 10 MG tablet, Take 1 tablet (10 mg total) by mouth 3 (three) times daily., Disp: 30 each, Rfl: 0 .  triamcinolone cream (KENALOG) 0.1 %, APPLY EXTERNALLY TO THE AFFECTED AREA TWICE DAILY, Disp: 30 g, Rfl: 0 .  valACYclovir (VALTREX) 500 MG tablet, Take 1 tablet (500 mg total) by mouth 2 (two) times daily as needed. Prn outbreak x 3 days, Disp: 30 tablet, Rfl: 1 .  levonorgestrel (MIRENA, 52 MG,) 20 MCG/24HR IUD, 1 Intra Uterine Device (1 each total) by Intrauterine route once for 1 dose., Disp: 1 each, Rfl: 0 .  pimecrolimus (ELIDEL) 1 % cream, APPLY EXTERNALLY TO THE AFFECTED AREA TWICE DAILY, Disp: 100 g, Rfl: 0  No Known Allergies  I personally reviewed active problem list, medication list, allergies, family history, social history with the patient/caregiver today.   ROS  Ten systems reviewed and is negative except as mentioned in HPI   Objective  Virtual encounter, vitals not obtained.  Body mass index is 40.77 kg/m.  Physical Exam  Awake, alert and oriented   PHQ2/9: Depression screen [facility] 2/9 03/05/2021 05/04/2020 02/08/2020 12/13/2019 07/08/2019  Decreased Interest 1 0 1 0 0  Down, Depressed, Hopeless 1 0 1 0 0  PHQ - 2 Score 2 0 2 0 0  Altered sleeping 0 0 0 0 0  Tired, decreased energy 1 0 1 0 1  Change in appetite 0 0 0 0 0  Feeling bad or failure about yourself  0 0 0 0 0  Trouble concentrating 0 0 0 0 0  Moving slowly or fidgety/restless 0 0 0 0 0  Suicidal thoughts 0 0 0 0 0  PHQ-9 Score 3 0 3 0 1  Difficult doing work/chores Not difficult at all Not difficult at all Not difficult at all Not difficult at all Not difficult at all   PHQ-2/9 Result is positive.    Fall Risk: Fall Risk  03/05/2021 05/04/2020 02/08/2020 12/13/2019 07/08/2019  Falls in the past year? 0 1 0 1 0  Number falls in past yr: - 1 0 0 0  Injury with Fall? - 0 0 1 0  Follow up Falls prevention discussed - - Falls evaluation completed -     Assessment & Plan  1. Tension  headache  - amitriptyline (ELAVIL) 10 MG tablet; Take 1-2 tablets (10-20 mg total) by mouth at bedtime.  Dispense: 60 tablet; Refill: 0 - baclofen (LIORESAL) 10 MG tablet; Take 1 tablet (10 mg total) by mouth 3 (three) times daily.  Dispense: 30 each; Refill: 0  2. Other eczema  - pimecrolimus (ELIDEL) 1 % cream; APPLY EXTERNALLY TO THE AFFECTED AREA TWICE DAILY  Dispense: 100 g; Refill: 0  I discussed the assessment and treatment plan with the patient. The patient was provided an opportunity to ask questions and all were  answered. The patient agreed with the plan and demonstrated an understanding of the instructions.  The patient was advised to call back or seek an in-person evaluation if the symptoms worsen or if the condition fails to improve as anticipated.  I provided 15  minutes of non-face-to-face time during this encounter.

## 2021-03-05 ENCOUNTER — Encounter: Payer: Self-pay | Admitting: Family Medicine

## 2021-03-05 ENCOUNTER — Telehealth (INDEPENDENT_AMBULATORY_CARE_PROVIDER_SITE_OTHER): Payer: Medicaid Other | Admitting: Family Medicine

## 2021-03-05 VITALS — Ht 65.0 in | Wt 245.0 lb

## 2021-03-05 DIAGNOSIS — G44209 Tension-type headache, unspecified, not intractable: Secondary | ICD-10-CM | POA: Diagnosis not present

## 2021-03-05 DIAGNOSIS — L308 Other specified dermatitis: Secondary | ICD-10-CM | POA: Diagnosis not present

## 2021-03-05 MED ORDER — PIMECROLIMUS 1 % EX CREA: TOPICAL_CREAM | CUTANEOUS | 0 refills | Status: DC

## 2021-03-05 MED ORDER — BACLOFEN 10 MG PO TABS: 10.0000 mg | ORAL_TABLET | Freq: Three times a day (TID) | ORAL | 0 refills | Status: DC

## 2021-03-05 MED ORDER — AMITRIPTYLINE HCL 10 MG PO TABS: 10.0000 mg | ORAL_TABLET | Freq: Every day | ORAL | 0 refills | Status: DC

## 2021-03-07 ENCOUNTER — Other Ambulatory Visit: Payer: Self-pay

## 2021-03-07 DIAGNOSIS — L308 Other specified dermatitis: Secondary | ICD-10-CM

## 2021-03-07 MED ORDER — PIMECROLIMUS 1 % EX CREA: TOPICAL_CREAM | CUTANEOUS | 0 refills | Status: DC

## 2021-03-29 ENCOUNTER — Telehealth: Payer: Self-pay

## 2021-03-29 NOTE — Telephone Encounter (Signed)
Copied from CRM [id number]. Topic: Appointment Scheduling - Scheduling Inquiry for Clinic >> Mar 29, 2021 10:45 AM Daphine Deutscher D wrote:  Pt has been having nausea for about a month. Burning in her throat.  Genella Rife.  CB#  [phone] >> Mar 29, 2021 10:56 AM Limmie Patricia wrote: appt sch'd for 5.31.2022 with Dr Carlynn Purl (first availability) but would like to know if you suggest her going to urgent care

## 2021-03-29 NOTE — Telephone Encounter (Signed)
Called and let pt know. She gave verbal understanding.

## 2021-04-04 NOTE — Progress Notes (Signed)
Name: Lisa House   MRN: [id number]    DOB: [birth year]   Date:04/09/2021       Progress Note  Subjective  Chief Complaint  Nausea/GERD  HPI  Nausea/GERD: she states skips breakfast , and sometimes has a protein bar, she packs her lunch ( salads and or sandwiches ) she has a very small dinner/more like a snack like chips, sometimes home meals - husband drops it off. She has a cancer phobia and is worried about her symptoms. Nausea is worse in the evenings. Occasionally she vomits. She sates it does not have any relation to meals. Episodes are about three times a week. She is also feeling tired. No change in bowel movements. She has epigastric burning that is intermittent, sometimes radiates to sub-sternal area. Some food also causes burning going down her esophagus.   OCD/Panic attack and depression: episodes of anxiety and panic attacks and OCD all started when her father died from complications of lung cancer, she witnessed his death when he had hemoptysis and died, she never had therapy. She states her son was born about 7216 months prior to her father dying, she was really worried about him getting sick, she started to wash hands constantly, wipe her counters, mops her floors every day. She took SSRI in the past but does not want to take anything at this time. She states she can cope with her stress.   Eczema: she washes her hands constantly, causes her hands to crack.  Psoriasis:she was diagnosed with psoriasis, only spot now is on ventral aspect of right wrist, she uses topical medication. She works at [employer] and picking up boxes rubs on her arm and aggravates it   Headaches: she states taking Elavil and seems to decrease frequency of episodes. She states episodes of migraines were daily and is now down to twice a week - she states she has to have 3 wisdom teeth removed and is hoping it will decrease frequency of episodes.   Asthma Moderate persistent: she was given albuterol  when she was pregnant 4 years ago, she states wheezes every night, denies dry cough or sob.    Post-coital bleeding: she will contact gyn for an appointment   Patient Active Problem List   Diagnosis Date Noted  . Lumbar radiculopathy 04/27/2020  . Neck pain 02/14/2020  . Right ovarian cyst 08/16/2019  . Family history of congenital aortic stenosis 09/23/2017  . Herpes genitalis 02/08/2017  . IBS (irritable bowel syndrome) 05/09/2015  . Morbid obesity (HCC) 05/09/2015  . OCD (obsessive compulsive disorder) 05/09/2015  . Bilateral polycystic ovarian syndrome 05/09/2015  . Psoriasis 05/09/2015  . History of depression 05/09/2015  . Family history of congenital heart defect 08/08/2014  . Allergic rhinitis 05/02/2010    Past Surgical History:  Procedure Laterality Date  . CHOLECYSTECTOMY  01/09/2011   Dr. Excell Seltzerooper    Family History  Problem Relation Age of Onset  . Hypertension Mother   . Diabetes Mother   . Headache Mother   . Stroke Mother   . Hypertension Father   . Lung cancer Father   . Asthma Father   . Heart disease Son        Aortic Valve Disorder Stenosis-had balloon valvuloplasty, on meds  . Ovarian cancer Maternal Grandmother   . Colon cancer Maternal Grandfather     Social History   Tobacco Use  . Smoking status: Former Games developermoker  . Smokeless tobacco: Never Used  Substance Use Topics  . Alcohol use:  No    Alcohol/week: 0.0 standard drinks     Current Outpatient Medications:  .  albuterol (PROVENTIL HFA) 108 (90 Base) MCG/ACT inhaler, Inhale 2 puffs into the lungs every 4 (four) hours as needed for wheezing or shortness of breath., Disp: 1 each, Rfl: 0 .  amitriptyline (ELAVIL) 10 MG tablet, Take 1-2 tablets (10-20 mg total) by mouth at bedtime., Disp: 60 tablet, Rfl: 0 .  baclofen (LIORESAL) 10 MG tablet, Take 1 tablet (10 mg total) by mouth 3 (three) times daily., Disp: 30 each, Rfl: 0 .  pimecrolimus (ELIDEL) 1 % cream, APPLY EXTERNALLY TO THE AFFECTED  AREA TWICE DAILY, Disp: 100 g, Rfl: 0 .  triamcinolone cream (KENALOG) 0.1 %, APPLY EXTERNALLY TO THE AFFECTED AREA TWICE DAILY, Disp: 30 g, Rfl: 0 .  valACYclovir (VALTREX) 500 MG tablet, Take 1 tablet (500 mg total) by mouth 2 (two) times daily as needed. Prn outbreak x 3 days, Disp: 30 tablet, Rfl: 1 .  levonorgestrel (MIRENA, 52 MG,) 20 MCG/24HR IUD, 1 Intra Uterine Device (1 each total) by Intrauterine route once for 1 dose., Disp: 1 each, Rfl: 0  No Known Allergies  I personally reviewed active problem list, medication list, allergies, family history, social history, health maintenance with the patient/caregiver today.   ROS  Constitutional: Negative for fever or weight change.  Respiratory: Negative for cough and shortness of breath.   Cardiovascular: Negative for chest pain or palpitations.  Gastrointestinal: Negative for abdominal pain, no bowel changes.  Musculoskeletal: Negative for gait problem or joint swelling.  Skin: positive  for rash.  Neurological: Negative for dizziness, positive for  headache.  No other specific complaints in a complete review of systems (except as listed in HPI above).   Objective  Vitals:   04/09/21 1013  BP: 130/88  Pulse: 89  Resp: 16  Temp: 97.7 F (36.5 C)  TempSrc: Oral  SpO2: 98%  Weight: 257 lb (116.6 kg)  Height: 5\' 5"  (1.651 m)    Body mass index is 42.77 kg/m.  Physical Exam  Constitutional: Patient appears well-developed and well-nourished. Obese  No distress.  HEENT: head atraumatic, normocephalic, pupils equal and reactive to light,neck supple Cardiovascular: Normal rate, regular rhythm and normal heart sounds.  No murmur heard. No BLE edema. Pulmonary/Chest: Effort normal , end expiratory wheezing . No respiratory distress. Abdominal: Soft.  There is no tenderness. Skin: eczematous patch  On right ventral arm  Psychiatric: Patient has a normal mood and affect. behavior is normal. Judgment and thought content  normal.  Recent Results (from the past 2160 hour(s))  Basic metabolic panel     Status: Abnormal   Collection Time: 02/19/21 10:07 AM  Result Value Ref Range   Sodium 136 135 - 145 mmol/L   Potassium 4.1 3.5 - 5.1 mmol/L   Chloride 104 98 - 111 mmol/L   CO2 27 22 - 32 mmol/L   Glucose, Bld 98 70 - 99 mg/dL    Comment: Glucose reference range applies only to samples taken after fasting for at least 8 hours.   BUN 11 6 - 20 mg/dL   Creatinine, Ser 04/21/21 0.44 - 1.00 mg/dL   Calcium 8.8 (L) 8.9 - 10.3 mg/dL   GFR, Estimated 6.94 >85 mL/min    Comment: (NOTE) Calculated using the CKD-EPI Creatinine Equation (2021)    Anion gap 5 5 - 15    Comment: Performed at [facility], [address], [place], [place]  CBC  Status: None   Collection Time: 02/19/21 10:07 AM  Result Value Ref Range   WBC 8.1 4.0 - 10.5 K/uL   RBC 4.72 3.87 - 5.11 MIL/uL   Hemoglobin 13.1 12.0 - 15.0 g/dL   HCT 46.9 62.9 - 52.8 %   MCV 81.4 80.0 - 100.0 fL   MCH 27.8 26.0 - 34.0 pg   MCHC 34.1 30.0 - 36.0 g/dL   RDW 41.3 24.4 - 01.0 %   Platelets 200 150 - 400 K/uL   nRBC 0.0 0.0 - 0.2 %    Comment: Performed at [facility], [address] [place], [place] [address]  Troponin I (High Sensitivity)     Status: None   Collection Time: 02/19/21 10:07 AM  Result Value Ref Range   Troponin I (High Sensitivity) <2 <18 ng/L    Comment: (NOTE) Elevated high sensitivity troponin I (hsTnI) values and significant  changes across serial measurements may suggest ACS but many other  chronic and acute conditions are known to elevate hsTnI results.  Refer to the "Links" section for chest pain algorithms and additional  guidance. Performed at [facility], [address] [place], [place] [address]   TSH     Status: None   Collection Time: 02/19/21 10:07 AM  Result Value Ref Range   TSH 1.895 0.350 - 4.500 uIU/mL    Comment: Performed by a 3rd Generation assay with a  functional sensitivity of <=0.01 uIU/mL. Performed at [facility], [address] [place], [place] [address]   T4, free     Status: None   Collection Time: 02/19/21 10:07 AM  Result Value Ref Range   Free T4 0.91 0.61 - 1.12 ng/dL    Comment: (NOTE) Biotin ingestion may interfere with free T4 tests. If the results are inconsistent with the TSH level, previous test results, or the clinical presentation, then consider biotin interference. If needed, order repeat testing after stopping biotin. Performed at [facility], [address] [place], [place] [address]   POC urine preg, ED     Status: None   Collection Time: 02/19/21 10:16 AM  Result Value Ref Range   Preg Test, Ur NEGATIVE NEGATIVE    Comment:        THE SENSITIVITY OF THIS METHODOLOGY IS >24 mIU/mL       PHQ2/9: Depression screen [facility] 2/9 04/09/2021 03/05/2021 05/04/2020 02/08/2020 12/13/2019  Decreased Interest 1 1 0 1 0  Down, Depressed, Hopeless 1 1 0 1 0  PHQ - 2 Score 2 2 0 2 0  Altered sleeping 1 0 0 0 0  Tired, decreased energy 3 1 0 1 0  Change in appetite 0 0 0 0 0  Feeling bad or failure about yourself  0 0 0 0 0  Trouble concentrating 0 0 0 0 0  Moving slowly or fidgety/restless 0 0 0 0 0  Suicidal thoughts 0 0 0 0 0  PHQ-9 Score 6 3 0 3 0  Difficult doing work/chores - Not difficult at all Not difficult at all Not difficult at all Not difficult at all  Some recent data might be hidden    phq 9 is positive   Fall Risk: Fall Risk  04/09/2021 03/05/2021 05/04/2020 02/08/2020 12/13/2019  Falls in the past year? 0 0 1 0 1  Number falls in past yr: 0 - 1 0 0  Injury with Fall? 0 - 0 0 1  Follow up - Falls prevention discussed - -  Falls evaluation completed     Functional Status Survey: Is the patient deaf or have difficulty hearing?: No Does the patient have difficulty seeing, even when wearing glasses/contacts?: No Does the patient have difficulty concentrating, remembering, or making  decisions?: No Does the patient have difficulty walking or climbing stairs?: No Does the patient have difficulty dressing or bathing?: No Does the patient have difficulty doing errands alone such as visiting a doctor's office or shopping?: No    Assessment & Plan  1. Tension headache  - amitriptyline (ELAVIL) 10 MG tablet; Take 1-2 tablets (10-20 mg total) by mouth at bedtime.  Dispense: 90 tablet; Refill: 1  2. Other eczema  - pimecrolimus (ELIDEL) 1 % cream; APPLY EXTERNALLY TO THE AFFECTED AREA TWICE DAILY  Dispense: 100 g; Refill: 0  3. Psoriasis  - triamcinolone cream (KENALOG) 0.1 %; Apply topically 2 (two) times daily.  Dispense: 453.6 g; Refill: 0  4. Genital herpes simplex, unspecified site  - valACYclovir (VALTREX) 500 MG tablet; Take 1 tablet (500 mg total) by mouth 2 (two) times daily as needed. Prn outbreak x 3 days  Dispense: 30 tablet; Refill: 1  5. Obsessive-compulsive disorder, unspecified type   6. Asthma, moderate persistent, poorly-controlled  - fluticasone-salmeterol (ADVAIR) 100-50 MCG/ACT AEPB; Inhale 1 puff into the lungs 2 (two) times daily.  Dispense: 180 each; Refill: 0  7. Gastroesophageal reflux disease without esophagitis  - omeprazole (PRILOSEC) 40 MG capsule; Take 1 capsule (40 mg total) by mouth daily.  Dispense: 90 capsule; Refill: 0

## 2021-04-09 ENCOUNTER — Ambulatory Visit (INDEPENDENT_AMBULATORY_CARE_PROVIDER_SITE_OTHER): Payer: Medicaid Other | Admitting: Family Medicine

## 2021-04-09 ENCOUNTER — Other Ambulatory Visit: Payer: Self-pay

## 2021-04-09 ENCOUNTER — Encounter: Payer: Self-pay | Admitting: Family Medicine

## 2021-04-09 VITALS — BP 130/88 | HR 89 | Temp 97.7°F | Resp 16 | Ht 65.0 in | Wt 257.0 lb

## 2021-04-09 DIAGNOSIS — J4540 Moderate persistent asthma, uncomplicated: Secondary | ICD-10-CM

## 2021-04-09 DIAGNOSIS — G44209 Tension-type headache, unspecified, not intractable: Secondary | ICD-10-CM | POA: Diagnosis not present

## 2021-04-09 DIAGNOSIS — F429 Obsessive-compulsive disorder, unspecified: Secondary | ICD-10-CM

## 2021-04-09 DIAGNOSIS — L308 Other specified dermatitis: Secondary | ICD-10-CM

## 2021-04-09 DIAGNOSIS — A6000 Herpesviral infection of urogenital system, unspecified: Secondary | ICD-10-CM

## 2021-04-09 DIAGNOSIS — K219 Gastro-esophageal reflux disease without esophagitis: Secondary | ICD-10-CM

## 2021-04-09 DIAGNOSIS — L409 Psoriasis, unspecified: Secondary | ICD-10-CM

## 2021-04-09 MED ORDER — OMEPRAZOLE 40 MG PO CPDR: 40.0000 mg | DELAYED_RELEASE_CAPSULE | Freq: Every day | ORAL | 0 refills | Status: DC

## 2021-04-09 MED ORDER — VALACYCLOVIR HCL 500 MG PO TABS: 500.0000 mg | ORAL_TABLET | Freq: Two times a day (BID) | ORAL | 1 refills | Status: DC | PRN

## 2021-04-09 MED ORDER — AMITRIPTYLINE HCL 10 MG PO TABS: 10.0000 mg | ORAL_TABLET | Freq: Every day | ORAL | 1 refills | Status: DC

## 2021-04-09 MED ORDER — TRIAMCINOLONE ACETONIDE 0.1 % EX CREA: TOPICAL_CREAM | Freq: Two times a day (BID) | CUTANEOUS | 0 refills | Status: DC

## 2021-04-09 MED ORDER — PIMECROLIMUS 1 % EX CREA
TOPICAL_CREAM | CUTANEOUS | 0 refills | Status: DC
Start: 2021-04-09 — End: 2024-09-27

## 2021-04-09 MED ORDER — FLUTICASONE-SALMETEROL 100-50 MCG/ACT IN AEPB
1.0000 | INHALATION_SPRAY | Freq: Two times a day (BID) | RESPIRATORY_TRACT | 0 refills | Status: DC
Start: 2021-04-09 — End: 2021-04-23

## 2021-04-10 ENCOUNTER — Telehealth: Payer: Self-pay

## 2021-04-10 NOTE — Telephone Encounter (Signed)
PA obtained from [facility] for pimecrolimus cream. PA #: [id number] exp on 05/10/2021. Patient called/vm left with information.

## 2021-04-22 ENCOUNTER — Other Ambulatory Visit: Payer: Self-pay

## 2021-04-22 DIAGNOSIS — N888 Other specified noninflammatory disorders of cervix uteri: Secondary | ICD-10-CM | POA: Diagnosis not present

## 2021-04-22 DIAGNOSIS — Z87891 Personal history of nicotine dependence: Secondary | ICD-10-CM | POA: Diagnosis not present

## 2021-04-22 DIAGNOSIS — Z30431 Encounter for routine checking of intrauterine contraceptive device: Secondary | ICD-10-CM | POA: Insufficient documentation

## 2021-04-22 DIAGNOSIS — N898 Other specified noninflammatory disorders of vagina: Secondary | ICD-10-CM | POA: Diagnosis present

## 2021-04-22 NOTE — ED Triage Notes (Signed)
Pt states she feels like "something is sticking" the inside of her vagina. States may be her mirena, states tried to feel for it and thinks she may have felt the tip of the IUD. States also felt a lump in the inside of her vagina.

## 2021-04-23 ENCOUNTER — Other Ambulatory Visit (HOSPITAL_COMMUNITY)
Admission: RE | Admit: 2021-04-23 | Discharge: 2021-04-23 | Disposition: A | Discharge status: Home / Self Care | Payer: Medicaid Other | Source: Ambulatory Visit | Attending: Obstetrics & Gynecology | Admitting: Obstetrics & Gynecology

## 2021-04-23 ENCOUNTER — Emergency Department
Admission: EM | Admit: 2021-04-23 | Discharge: 2021-04-23 | Disposition: A | Discharge status: Home / Self Care | Payer: Medicaid Other | Attending: Emergency Medicine | Admitting: Emergency Medicine

## 2021-04-23 ENCOUNTER — Ambulatory Visit (INDEPENDENT_AMBULATORY_CARE_PROVIDER_SITE_OTHER): Payer: Medicaid Other | Admitting: Obstetrics

## 2021-04-23 ENCOUNTER — Encounter: Payer: Self-pay | Admitting: Obstetrics

## 2021-04-23 VITALS — BP 110/70 | Ht 65.0 in | Wt 253.0 lb

## 2021-04-23 DIAGNOSIS — N888 Other specified noninflammatory disorders of cervix uteri: Secondary | ICD-10-CM

## 2021-04-23 DIAGNOSIS — N898 Other specified noninflammatory disorders of vagina: Secondary | ICD-10-CM | POA: Diagnosis present

## 2021-04-23 DIAGNOSIS — Z113 Encounter for screening for infections with a predominantly sexual mode of transmission: Secondary | ICD-10-CM | POA: Diagnosis not present

## 2021-04-23 DIAGNOSIS — Z30431 Encounter for routine checking of intrauterine contraceptive device: Secondary | ICD-10-CM

## 2021-04-23 NOTE — ED Provider Notes (Signed)
[facility] Emergency Department Provider Note  ____________________________________________   Event Date/Time   First MD Initiated Contact with Patient 04/23/21 0125     (approximate)  I have reviewed the triage vital signs and the nursing notes.   HISTORY  Chief Complaint Vaginal Pain    HPI Lisa House is a 30 y.o. female who presents for evaluation of possible IUD malfunction.  She has had a Mirena for about 2 years and she says she was at work and squatting down when she felt like the IUD moved.  After she got home she took a shower and felt inside her vagina and thought she felt something poking her.  She also felt a lump that she was not familiar with.  She has no abdominal or pelvic pain.  She has not had any recent discharge or pelvic discomfort.  She has no concerns for STD.  She has not been having any dysuria.  She goes to [facility].  Nothing in particular makes the symptoms better or worse and they are mild.     Past Medical History:  Diagnosis Date   Allergy    Depression    Eczema    GERD (gastroesophageal reflux disease)    Intertrigo    Obesity    OCD (obsessive compulsive disorder)    Panic attack     Patient Active Problem List   Diagnosis Date Noted   Lumbar radiculopathy 04/27/2020   Neck pain 02/14/2020   Right ovarian cyst 08/16/2019   Family history of congenital aortic stenosis 09/23/2017   Herpes genitalis 02/08/2017   IBS (irritable bowel syndrome) 05/09/2015   Morbid obesity (HCC) 05/09/2015   OCD (obsessive compulsive disorder) 05/09/2015   Bilateral polycystic ovarian syndrome 05/09/2015   Psoriasis 05/09/2015   History of depression 05/09/2015   Family history of congenital heart defect 08/08/2014   Allergic rhinitis 05/02/2010    Past Surgical History:  Procedure Laterality Date   CHOLECYSTECTOMY  01/09/2011   Dr. Excell Seltzer    Prior to Admission medications   Medication Sig Start Date End  Date Taking? Authorizing Provider  albuterol (PROVENTIL HFA) 108 (90 Base) MCG/ACT inhaler Inhale 2 puffs into the lungs every 4 (four) hours as needed for wheezing or shortness of breath. 02/19/21   Sharman Cheek, MD  amitriptyline (ELAVIL) 10 MG tablet Take 1-2 tablets (10-20 mg total) by mouth at bedtime. 04/09/21   Alba Selen Smucker, MD  baclofen (LIORESAL) 10 MG tablet Take 1 tablet (10 mg total) by mouth 3 (three) times daily. 03/05/21   Alba Babbie Dondlinger, MD  fluticasone-salmeterol (ADVAIR) 100-50 MCG/ACT AEPB Inhale 1 puff into the lungs 2 (two) times daily. 04/09/21   Alba Dayzee Trower, MD  levonorgestrel (MIRENA, 52 MG,) 20 MCG/24HR IUD 1 Intra Uterine Device (1 each total) by Intrauterine route once for 1 dose. 06/15/18 06/15/18  Alba Kolbi Tofte, MD  omeprazole (PRILOSEC) 40 MG capsule Take 1 capsule (40 mg total) by mouth daily. 04/09/21   Sowles, Danna Hefty, MD  pimecrolimus (ELIDEL) 1 % cream APPLY EXTERNALLY TO THE AFFECTED AREA TWICE DAILY 04/09/21   Alba Zoraida Havrilla, MD  triamcinolone cream (KENALOG) 0.1 % Apply topically 2 (two) times daily. 04/09/21   Alba Sophia Cubero, MD  valACYclovir (VALTREX) 500 MG tablet Take 1 tablet (500 mg total) by mouth 2 (two) times daily as needed. Prn outbreak x 3 days 04/09/21   Alba Minna Dumire, MD    Allergies Patient has no known allergies.  Family History  Problem Relation Age of  Onset   Hypertension Mother    Diabetes Mother    Headache Mother    Stroke Mother    Hypertension Father    Lung cancer Father    Asthma Father    Heart disease Son        Aortic Valve Disorder Stenosis-had balloon valvuloplasty, on meds   Ovarian cancer Maternal Grandmother    Colon cancer Maternal Grandfather    Cleft palate Son     Social History Social History   Tobacco Use   Smoking status: Former    Pack years: 0.00   Smokeless tobacco: Never  Vaping Use   Vaping Use: Never used  Substance Use Topics   Alcohol use: No    Alcohol/week: 0.0 standard drinks    Drug use: No    Review of Systems Constitutional: No fever/chills Eyes: No visual changes. ENT: No sore throat. Cardiovascular: Denies chest pain. Respiratory: Denies shortness of breath. Gastrointestinal: No abdominal pain.  No nausea, no vomiting.  No diarrhea.  No constipation. Genitourinary: Possible IUD displacement as well as a "lump" inside the vagina. Musculoskeletal: Negative for neck pain.  Negative for back pain. Integumentary: Negative for rash. Neurological: Negative for headaches, focal weakness or numbness.   ____________________________________________   PHYSICAL EXAM:  VITAL SIGNS: ED Triage Vitals  Enc Vitals Group     BP 04/22/21 2306 138/62     Pulse Rate 04/22/21 2306 83     Resp 04/22/21 2306 20     Temp 04/22/21 2306 98.4 F (36.9 C)     Temp Source 04/22/21 2306 Oral     SpO2 04/22/21 2306 98 %     Weight 04/22/21 2307 114.8 kg (253 lb)     Height 04/22/21 2307 1.651 m (5\' 5" )     Head Circumference --      Peak Flow --      Pain Score 04/22/21 2306 0     Pain Loc --      Pain Edu? --      Excl. in GC? --     Constitutional: Alert and oriented.  Eyes: Conjunctivae are normal.  Head: Atraumatic. Nose: No congestion/rhinnorhea. Mouth/Throat: Patient is wearing a mask. Neck: No stridor.  No meningeal signs.   Cardiovascular: Normal rate, regular rhythm. Good peripheral circulation. Respiratory: Normal respiratory effort.  No retractions. Gastrointestinal: Soft and nontender. No distention.  Genitourinary: Normal external female genitalia with no rash nor evidence of infection.  Moderate amount of vaginal discharge without any overt sign of infection.  I could not visualize any segment of the IUD including the IUD strings but on bimanual exam I could palpate the strings to the side of her cervix.  She has a small cluster of what appear to be a nabothian cyst on her cervix which could be the lump she was palpating earlier.  Her cervix does not  appear friable and there is no evidence of cervicitis.  No cervical motion tenderness or other pain on exam.  ED nurse was present throughout the exam as chaperone. Musculoskeletal: No lower extremity tenderness nor edema. No gross deformities of extremities. Neurologic:  Normal speech and language. No gross focal neurologic deficits are appreciated.  Skin:  Skin is warm, dry and intact. Psychiatric: Mood and affect are normal. Speech and behavior are normal.  ____________________________________________    INITIAL IMPRESSION / MDM / ASSESSMENT AND PLAN / ED COURSE  As part of my medical decision making, I reviewed the following data within the electronic medical  record:  Nursing notes reviewed and incorporated, Old chart reviewed, and Notes from prior ED visits   Differential diagnosis includes, but is not limited to, IUD malfunction, uterine perforation, STD/PID, BV, cervicitis, cervical cyst.  Patient is in no distress with stable vital signs and no pain.  Pelvic exam was reassuring.  I had difficulty visualizing the cervical os but I did not see any evidence of IUD and I was able to palpate the strings of the IUD on bimanual exam.  I believe that the "lump" she was feeling was a small cluster of nabothian cyst on her cervix.  I explained that these are most likely benign but I strongly encouraged her to follow-up with [facility] at the next available opportunity.  I explained that we could get an ultrasound tonight to verify the IUD placement but she could also discuss this with the left side as well and she said she would prefer to do that since it is late.  I gave my usual customary follow-up recommendations and return precautions.           ____________________________________________  FINAL CLINICAL IMPRESSION(S) / ED DIAGNOSES  Final diagnoses:  Nabothian cyst  IUD check up     MEDICATIONS GIVEN DURING THIS VISIT:  Medications - No data to display   ED Discharge Orders      None        Note:  This document was prepared using Dragon voice recognition software and may include unintentional dictation errors.   Loleta Rose, MD 04/23/21 [phone]

## 2021-04-23 NOTE — Discharge Instructions (Addendum)
As we discussed, your IUD appears to be in the right position today.  We did not visualize the IUD protruding from your cervix and we could feel the strings on physical exam.  You have a cyst on your ovary that we believe is what is called a nabothian cyst.  This can be totally normal and not a cause of concern, but given the discomfort associated with the IUD as well as the finding of the cyst on your cervix, we recommend that you call [facility] today to schedule the next available appointment to follow-up, to verify the IUD placement as well as to further examine and evaluate the cervical cyst.    Return to the emergency department if you develop new or worsening symptoms that concern you.

## 2021-04-23 NOTE — Progress Notes (Signed)
Obstetrics & Gynecology Office Visit   Chief Complaint:  Chief Complaint  Patient presents with   Vaginal Exam    Knot inside vagina, not tender/painful that was noticed yesterday   Vaginal Discharge    No itchiness or odor on/off x 6 months    History of Present Illness: Lisa House presetns with concern that she has a lump in her vagina.  Her husband felt something somehwat firm during intercourse and wantnted her to have ie checked. She is married; contracepts with an IUD. She has not had a [facility] physical in three years since her last delivery. She is a Gravia 4 para 4. Hr last pap smear was NILM.   Review of Systems:  Review of Systems  Constitutional: Negative.   HENT: Negative.    Eyes: Negative.   Respiratory: Negative.    Cardiovascular: Negative.   Gastrointestinal: Negative.   Genitourinary: Negative.   Musculoskeletal: Negative.   Skin: Negative.   Neurological: Negative.   Endo/Heme/Allergies: Negative.   Psychiatric/Behavioral: Negative.      Past Medical History:  Past Medical History:  Diagnosis Date   Allergy    Depression    Eczema    GERD (gastroesophageal reflux disease)    Intertrigo    Obesity    OCD (obsessive compulsive disorder)    Panic attack     Past Surgical History:  Past Surgical History:  Procedure Laterality Date   CHOLECYSTECTOMY  01/09/2011   Dr. Excell Seltzer    Gynecologic History: No LMP recorded. (Menstrual status: IUD).  Obstetric History: [id number]  Family History:  Family History  Problem Relation Age of Onset   Hypertension Mother    Diabetes Mother    Headache Mother    Stroke Mother    Hypertension Father    Lung cancer Father    Asthma Father    Heart disease Son        Aortic Valve Disorder Stenosis-had balloon valvuloplasty, on meds   Ovarian cancer Maternal Grandmother    Colon cancer Maternal Grandfather    Cleft palate Son     Social History:  Social History   Socioeconomic History   Marital  status: Single    Spouse name: Not on file   Number of children: 3   Years of education: high school   Highest education level: High school graduate  Occupational History   Occupation: Environmental consultant: [employer]  Tobacco Use   Smoking status: Former    Pack years: 0.00   Smokeless tobacco: Never  Vaping Use   Vaping Use: Never used  Substance and Sexual Activity   Alcohol use: No    Alcohol/week: 0.0 standard drinks   Drug use: No   Sexual activity: Yes    Partners: Male    Birth control/protection: I.U.D.    Comment: Mirena  Other Topics Concern   Not on file  Social History Narrative   Not on file   Social Determinants of Health   Financial Resource Strain: Not on file  Food Insecurity: Not on file  Transportation Needs: Not on file  Physical Activity: Not on file  Stress: Not on file  Social Connections: Not on file  Intimate Partner Violence: Not At Risk   Fear of Current or Ex-Partner: No   Emotionally Abused: No   Physically Abused: No   Sexually Abused: No    Allergies:  No Known Allergies  Medications: Prior to Admission medications   Medication Sig  Start Date End Date Taking? Authorizing Provider  albuterol (PROVENTIL HFA) 108 (90 Base) MCG/ACT inhaler Inhale 2 puffs into the lungs every 4 (four) hours as needed for wheezing or shortness of breath. 02/19/21  Yes Sharman Cheek, MD  omeprazole (PRILOSEC) 40 MG capsule Take 1 capsule (40 mg total) by mouth daily. 04/09/21  Yes Sowles, Danna Hefty, MD  pimecrolimus (ELIDEL) 1 % cream APPLY EXTERNALLY TO THE AFFECTED AREA TWICE DAILY 04/09/21  Yes Sowles, Danna Hefty, MD  triamcinolone cream (KENALOG) 0.1 % Apply topically 2 (two) times daily. 04/09/21  Yes Sowles, Danna Hefty, MD  valACYclovir (VALTREX) 500 MG tablet Take 1 tablet (500 mg total) by mouth 2 (two) times daily as needed. Prn outbreak x 3 days 04/09/21  Yes Sowles, Danna Hefty, MD  levonorgestrel (MIRENA, 52 MG,) 20 MCG/24HR IUD 1 Intra  Uterine Device (1 each total) by Intrauterine route once for 1 dose. 06/15/18 06/15/18  [name], MD    Physical Exam Vitals:  Vitals:   04/23/21 0938  BP: 110/70   No LMP recorded. (Menstrual status: IUD).  Physical Exam Constitutional:      Appearance: Normal appearance. She is obese.  HENT:     Head: Normocephalic and atraumatic.     Nose: Nose normal.  Cardiovascular:     Rate and Rhythm: Normal rate and regular rhythm.  Pulmonary:     Effort: Pulmonary effort is normal.  Abdominal:     Palpations: Abdomen is soft.     Comments: High BMI  Genitourinary:    General: Normal vulva.     Rectum: Normal.     Comments: No external lesions or rashes. Normal vaginal walls; scant discharge. Cervix is visualized; normal appearance. IUD strings are visible protruding from the os. No abnormal findings. Bimanual exam - no masses or pelvic fullness noted. Adequate vaginal tone noted. No cystocele noted. Some stool palpated on the posterior vaginal wall. Musculoskeletal:        General: Normal range of motion.     Cervical back: Normal range of motion and neck supple.  Skin:    General: Skin is warm and dry.  Neurological:     General: No focal deficit present.     Mental Status: She is alert and oriented to person, place, and time.  Psychiatric:        Mood and Affect: Mood normal.        Behavior: Behavior normal.     Assessment: 30 y.o. [id number] No problem-specific Assessment & Plan notes found for this encounter.   Plan: Problem List Items Addressed This Visit   None Visit Diagnoses     Vaginal discharge    -  Primary   Relevant Orders   Cervicovaginal ancillary only   Vaginal mass       Screen for STD (sexually transmitted disease)          Reassured of normal findings; that most likely her husband felt her cervix during intercourse. Aptima swab sent to check for infection. Encouraged her to set up an annual [facility] exam- as she is over due. Last pap was in  2019.  Mirna Mires, [name]  04/23/2021 10:22 AM

## 2021-04-24 ENCOUNTER — Other Ambulatory Visit: Payer: Self-pay | Admitting: Obstetrics

## 2021-04-24 ENCOUNTER — Encounter: Payer: Self-pay | Admitting: Obstetrics

## 2021-04-24 ENCOUNTER — Ambulatory Visit: Payer: Medicaid Other | Admitting: Obstetrics & Gynecology

## 2021-04-24 DIAGNOSIS — N898 Other specified noninflammatory disorders of vagina: Secondary | ICD-10-CM

## 2021-04-24 DIAGNOSIS — B9689 Other specified bacterial agents as the cause of diseases classified elsewhere: Secondary | ICD-10-CM

## 2021-04-24 LAB — CERVICOVAGINAL ANCILLARY ONLY
Bacterial Vaginitis (gardnerella): POSITIVE — AB
Candida Glabrata: NEGATIVE
Candida Vaginitis: NEGATIVE
Chlamydia: NEGATIVE
Comment: NEGATIVE
Comment: NEGATIVE
Comment: NEGATIVE
Comment: NEGATIVE
Comment: NEGATIVE
Comment: NORMAL
Neisseria Gonorrhea: NEGATIVE
Trichomonas: NEGATIVE

## 2021-04-24 MED ORDER — METRONIDAZOLE 500 MG PO TABS: 500.0000 mg | ORAL_TABLET | Freq: Two times a day (BID) | ORAL | 0 refills | Status: AC

## 2021-04-24 NOTE — Progress Notes (Unsigned)
metronida

## 2021-04-24 NOTE — Progress Notes (Signed)
Pt seen in the office and cervical ancillary aptima swab shows BV. A Rx for Flagyl sent to her pharmacy, and she is notified via MyChart.

## 2021-05-01 ENCOUNTER — Ambulatory Visit: Payer: Self-pay

## 2021-05-01 NOTE — Telephone Encounter (Signed)
Pt. Concerned about easy bruising.Yesterday sat outside and then noticed bruising to her buttocks."I'm worried something might be wrong to cause this easy bruising." No availability until July with PCP. Pt. Would like to be seen sooner. Please advise.

## 2021-05-01 NOTE — Telephone Encounter (Signed)
Spoke with patient and scheduled her to see Dr. Linwood Dibbles on 05/07/2021.  Pt would also like a call back if we have any cancellations for tomorrow. I also informed patient that if she could not wait until 05/07/2021 and we didn't any cancellations for tomorrow that she could go to an UC.  Patient verbalized understanding.

## 2021-05-01 NOTE — Telephone Encounter (Signed)
Patient called in wanting to speak to nurse about several small bruises she has on her bottom after sitting on seat. Please call back Reason for Disposition  [1] Not caused by an injury AND [2] < 5 unexplained bruises  Answer Assessment - Initial Assessment Questions 1. APPEARANCE of BRUISE: "Describe the bruise."      Several small bruises to buttocks 2. SIZE: "How large is the bruise?"      Eraser size 3. NUMBER: "How many bruises are there?"      7 on each buttock 4. LOCATION: "Where is the bruise located?"      Buttocks 5. ONSET: "How long ago did the bruise occur?"      Yesterday 6. CAUSE: "Tell me how it happened."     Sitting 7. MEDICAL HISTORY: "Do you have any medical problems that can cause easy bruising or bleeding?" (e.g., leukemia, liver disease, recent chemotherapy)     No 8. MEDICATIONS: "Do you take any medications which thin the blood such as: aspirin, heparin, ibuprofen (NSAIDS), Plavix, or Coumadin?"     Ibu 9. OTHER SYMPTOMS: "Do you have any other symptoms?"  (e.g., weakness, dizziness, pain, fever, nosebleed, blood in urine/stool)     No 10. PREGNANCY: "Is there any chance you are pregnant?" "When was your last menstrual period?"       No  Protocols used: Bruises-A-AH

## 2021-05-07 ENCOUNTER — Ambulatory Visit: Payer: Medicaid Other | Admitting: Family Medicine

## 2021-05-07 NOTE — Progress Notes (Deleted)
    SUBJECTIVE:   CHIEF COMPLAINT / HPI:   Bruising on Buttocks - ***  OBJECTIVE:   There were no vitals taken for this visit.  ***  ASSESSMENT/PLAN:   No problem-specific Assessment & Plan notes found for this encounter.     Caro Laroche, DO [facility]   {    This will disappear when note is signed, click to select method of visit    :1}

## 2021-06-10 ENCOUNTER — Ambulatory Visit: Payer: Medicaid Other | Admitting: Obstetrics & Gynecology

## 2021-06-12 ENCOUNTER — Ambulatory Visit: Payer: Self-pay

## 2021-06-12 NOTE — Telephone Encounter (Signed)
Appt made for 06-13-2021 with cheryl wicker

## 2021-06-12 NOTE — Telephone Encounter (Signed)
Tested positive for COVID 19 06/06/21.Having nausea, headache,dizziness. Taking Zofran from "old ED visit." Motrin helps headache "a little." Reports she had recent oral surgery as well. Wants to know what else she can take to feel better. Also asking for a note for work. Please advise.   Answer Assessment - Initial Assessment Questions 1. COVID-19 DIAGNOSIS: "Who made your COVID-19 diagnosis?" "Was it confirmed by a positive lab test or self-test?" If not diagnosed by a doctor (or NP/PA), ask "Are there lots of cases (community spread) where you live?" Note: See public health department website, if unsure.     Home test 2. COVID-19 EXPOSURE: "Was there any known exposure to COVID before the symptoms began?" CDC Definition of close contact: within 6 feet (2 meters) for a total of 15 minutes or more over a 24-hour period.      Yes 3. ONSET: "When did the COVID-19 symptoms start?"      06/06/21 4. WORST SYMPTOM: "What is your worst symptom?" (e.g., cough, fever, shortness of breath, muscle aches)     Nausea, headache,dizzy 5. COUGH: "Do you have a cough?" If Yes, ask: "How bad is the cough?"       Yes 6. FEVER: "Do you have a fever?" If Yes, ask: "What is your temperature, how was it measured, and when did it start?"     No 7. RESPIRATORY STATUS: "Describe your breathing?" (e.g., shortness of breath, wheezing, unable to speak)      No 8. BETTER-SAME-WORSE: "Are you getting better, staying the same or getting worse compared to yesterday?"  If getting worse, ask, "In what way?"     Same 9. HIGH RISK DISEASE: "Do you have any chronic medical problems?" (e.g., asthma, heart or lung disease, weak immune system, obesity, etc.)     Asthma 10. VACCINE: "Have you had the COVID-19 vaccine?" If Yes, ask: "Which one, how many shots, when did you get it?"       No 11. BOOSTER: "Have you received your COVID-19 booster?" If Yes, ask: "Which one and when did you get it?"       No 12. PREGNANCY: "Is there any  chance you are pregnant?" "When was your last menstrual period?"       No 13. OTHER SYMPTOMS: "Do you have any other symptoms?"  (e.g., chills, fatigue, headache, loss of smell or taste, muscle pain, sore throat)       Headache,nausea  14. O2 SATURATION MONITOR:  "Do you use an oxygen saturation monitor (pulse oximeter) at home?" If Yes, ask "What is your reading (oxygen level) today?" "What is your usual oxygen saturation reading?" (e.g., 95%)       No  Protocols used: Coronavirus (COVID-19) Diagnosed or Suspected-A-AH

## 2021-06-13 ENCOUNTER — Telehealth (INDEPENDENT_AMBULATORY_CARE_PROVIDER_SITE_OTHER): Payer: Medicaid Other | Admitting: Unknown Physician Specialty

## 2021-06-13 ENCOUNTER — Other Ambulatory Visit: Payer: Self-pay

## 2021-06-13 ENCOUNTER — Encounter: Payer: Self-pay | Admitting: Unknown Physician Specialty

## 2021-06-13 DIAGNOSIS — U071 COVID-19: Secondary | ICD-10-CM | POA: Diagnosis not present

## 2021-06-13 MED ORDER — ONDANSETRON 4 MG PO TBDP: 4.0000 mg | ORAL_TABLET | Freq: Three times a day (TID) | ORAL | 0 refills | Status: DC | PRN

## 2021-06-13 NOTE — Progress Notes (Signed)
There were no vitals taken for this visit.   Subjective:    Patient ID: Lisa House, female    DOB: Apr 16, 1991, 30 y.o.   MRN: [id number]  HPI: Lisa House is a 30 y.o. female  Chief Complaint  Patient presents with   Covid Positive    For 1 week, nausea, dizziness, headache   Due to the catastrophic nature of the COVID-19 pandemic, this visit was completed via audio and visual contact via telephone due to technical difficulties,  due to the restrictions of the COVID-19 pandemic. All issues as above were discussed and addressed. Physical exam was done as above through visual confirmation on CTelephone  f it was felt that the patient should be evaluated in the office, they were directed there. The patient verbally consented to this visit."} Location of the patient: home Location of the provider: work Time spent on call: 30 minutes with chart review I verified patient identity using two factors (patient name and date of birth). Patient consents verbally to being seen via telemedicine visit today.      URI  This is a new problem. Episode onset: Sxs started 7 days ago on the same day she was tested and having oral surgery. The problem has been gradually improving. Maximum temperature: High fever initially.  This has now resolved. The fever has been present for 1 to 2 days. Associated symptoms include congestion, coughing, diarrhea, headaches, joint pain and nausea. Pertinent negatives include no vomiting or wheezing. Associated symptoms comments: Light-headed. Treatments tried: Zofran. The treatment provided significant relief.    Relevant past medical, surgical, family and social history reviewed and updated as indicated. Interim medical history since our last visit reviewed. Allergies and medications reviewed and updated.  Review of Systems  HENT:  Positive for congestion.   Respiratory:  Positive for cough. Negative for wheezing.   Gastrointestinal:  Positive for diarrhea  and nausea. Negative for vomiting.  Musculoskeletal:  Positive for joint pain.  Neurological:  Positive for headaches.   Per HPI unless specifically indicated above     Objective:    There were no vitals taken for this visit.  Wt Readings from Last 3 Encounters:  04/23/21 253 lb (114.8 kg)  04/22/21 253 lb (114.8 kg)  04/09/21 257 lb (116.6 kg)    Physical Exam Eyes:     General: No scleral icterus (zofran).       Right eye: No discharge (zofran).        Left eye: No discharge.  Neurological:     Mental Status: She is alert.  Psychiatric:        Mood and Affect: Mood normal.    Results for orders placed or performed in visit on 04/23/21  Cervicovaginal ancillary only  Result Value Ref Range   Neisseria Gonorrhea Negative    Chlamydia Negative    Trichomonas Negative    Bacterial Vaginitis (gardnerella) Positive (A)    Candida Vaginitis Negative    Candida Glabrata Negative    Comment      Normal Reference Range Bacterial Vaginosis - Negative   Comment Normal Reference Range Candida Species - Negative    Comment Normal Reference Range Candida Galbrata - Negative    Comment Normal Reference Range Trichomonas - Negative    Comment Normal Reference Ranger Chlamydia - Negative    Comment      Normal Reference Range Neisseria Gonorrhea - Negative      Assessment & Plan:   Problem List Items Addressed  This Visit   None Visit Diagnoses     COVID-19    -  Primary   Pt on day 8 of Covvid sxs.  She is recovering from oral surgery and mother getting open heart surgery. Supplements recommended.  Continue Zofran and work note.         Follow up plan: Return if symptoms worsen or fail to improve.

## 2021-06-13 NOTE — Patient Instructions (Signed)
Vitamin C Vitamin D Zinc Elderberry Quercetin Melatonin 10 mg

## 2021-06-25 NOTE — Progress Notes (Deleted)
Name: Lisa House   MRN: [id number]    DOB: 1990/12/28   Date:06/25/2021       Progress Note  Subjective  Chief Complaint  Annual Exam  HPI  Patient presents for annual CPE.  Diet: *** Exercise: ***   Flowsheet Row Video Visit from 06/13/2021 in [facility]  AUDIT-C Score 0      Depression: Phq 9 is  {Desc; negative/positive:13464} Depression screen [facility] 2/9 06/13/2021 04/09/2021 03/05/2021 05/04/2020 02/08/2020  Decreased Interest 0 1 1 0 1  Down, Depressed, Hopeless 0 1 1 0 1  PHQ - 2 Score 0 2 2 0 2  Altered sleeping - 1 0 0 0  Tired, decreased energy - 3 1 0 1  Change in appetite - 0 0 0 0  Feeling bad or failure about yourself  - 0 0 0 0  Trouble concentrating - 0 0 0 0  Moving slowly or fidgety/restless - 0 0 0 0  Suicidal thoughts - 0 0 0 0  PHQ-9 Score - 6 3 0 3  Difficult doing work/chores - - Not difficult at all Not difficult at all Not difficult at all  Some recent data might be hidden   Hypertension: BP Readings from Last 3 Encounters:  04/23/21 110/70  04/23/21 119/76  04/09/21 130/88   Obesity: Wt Readings from Last 3 Encounters:  04/23/21 253 lb (114.8 kg)  04/22/21 253 lb (114.8 kg)  04/09/21 257 lb (116.6 kg)   BMI Readings from Last 3 Encounters:  04/23/21 42.10 kg/m  04/22/21 42.10 kg/m  04/09/21 42.77 kg/m     Vaccines:  HPV: up to at age 64 , ask insurance if age between 26-45  Shingrix: 73-64 yo and ask insurance if covered when patient above 52 yo Pneumonia: educated and discussed with patient. Flu: educated and discussed with patient.  Hep C Screening: N/A STD testing and prevention (HIV/chl/gon/syphilis): 02/05/18 Intimate partner violence:negative Sexual History : Menstrual History/LMP/Abnormal Bleeding:  Incontinence Symptoms:   Breast cancer:  - Last Mammogram: N/A - BRCA gene screening: N/A  Osteoporosis: Discussed high calcium and vitamin D supplementation, weight bearing exercises  Cervical  cancer screening: today  Skin cancer: Discussed monitoring for atypical lesions  Colorectal cancer: N/A   Lung cancer: Low Dose CT Chest recommended if Age 55-80 years, 20 pack-year currently smoking OR have quit w/in 15years. Patient does not qualify.   ECG: 02/19/21  Advanced Care Planning: A voluntary discussion about advance care planning including the explanation and discussion of advance directives.  Discussed health care proxy and Living will, and the patient was able to identify a health care proxy as ***.  Patient does not have a living will at present time. If patient does have living will, I have requested they bring this to the clinic to be scanned in to their chart.  Lipids: Lab Results  Component Value Date   CHOL 173 05/19/2017   Lab Results  Component Value Date   HDL 65 05/19/2017   Lab Results  Component Value Date   LDLCALC 84 05/19/2017   Lab Results  Component Value Date   TRIG 118 05/19/2017   Lab Results  Component Value Date   CHOLHDL 2.7 05/19/2017   No results found for: LDLDIRECT  Glucose: Glucose  Date Value Ref Range Status  03/06/2014 91 65 - 99 mg/dL Final   Glucose, Bld  Date Value Ref Range Status  02/19/2021 98 70 - 99 mg/dL Final    Comment:  Glucose reference range applies only to samples taken after fasting for at least 8 hours.  12/29/2020 108 (H) 70 - 99 mg/dL Final    Comment:    Glucose reference range applies only to samples taken after fasting for at least 8 hours.  06/19/2020 84 70 - 99 mg/dL Final    Comment:    Glucose reference range applies only to samples taken after fasting for at least 8 hours.   Glucose-Capillary  Date Value Ref Range Status  05/18/2017 85 65 - 99 mg/dL Final    Patient Active Problem List   Diagnosis Date Noted   Lumbar radiculopathy 04/27/2020   Neck pain 02/14/2020   Right ovarian cyst 08/16/2019   Family history of congenital aortic stenosis 09/23/2017   Herpes genitalis 02/08/2017    IBS (irritable bowel syndrome) 05/09/2015   Morbid obesity ([place]) 05/09/2015   OCD (obsessive compulsive disorder) 05/09/2015   Bilateral polycystic ovarian syndrome 05/09/2015   Psoriasis 05/09/2015   History of depression 05/09/2015   Allergic rhinitis 05/02/2010    Past Surgical History:  Procedure Laterality Date   CHOLECYSTECTOMY  01/09/2011   Dr. Burt Knack    Family History  Problem Relation Age of Onset   Hypertension Mother    Diabetes Mother    Headache Mother    Stroke Mother    Hypertension Father    Lung cancer Father    Asthma Father    Heart disease Son        Aortic Valve Disorder Stenosis-had balloon valvuloplasty, on meds   Ovarian cancer Maternal Grandmother    Colon cancer Maternal Grandfather    Cleft palate Son     Social History   Socioeconomic History   Marital status: Single    Spouse name: Not on file   Number of children: 3   Years of education: high school   Highest education level: High school graduate  Occupational History   Occupation: Contractor     Employer: [employer]  Tobacco Use   Smoking status: Former   Smokeless tobacco: Never  Scientific laboratory technician Use: Never used  Substance and Sexual Activity   Alcohol use: No    Alcohol/week: 0.0 standard drinks   Drug use: No   Sexual activity: Yes    Partners: Male    Birth control/protection: I.U.D.    Comment: Mirena  Other Topics Concern   Not on file  Social History Narrative   Not on file   Social Determinants of Health   Financial Resource Strain: Not on file  Food Insecurity: Not on file  Transportation Needs: Not on file  Physical Activity: Not on file  Stress: Not on file  Social Connections: Not on file  Intimate Partner Violence: Not At Risk   Fear of Current or Ex-Partner: No   Emotionally Abused: No   Physically Abused: No   Sexually Abused: No     Current Outpatient Medications:    albuterol (PROVENTIL HFA) 108 (90 Base) MCG/ACT inhaler, Inhale  2 puffs into the lungs every 4 (four) hours as needed for wheezing or shortness of breath., Disp: 1 each, Rfl: 0   levonorgestrel (MIRENA, 52 MG,) 20 MCG/24HR IUD, 1 Intra Uterine Device (1 each total) by Intrauterine route once for 1 dose., Disp: 1 each, Rfl: 0   omeprazole (PRILOSEC) 40 MG capsule, Take 1 capsule (40 mg total) by mouth daily., Disp: 90 capsule, Rfl: 0   ondansetron (ZOFRAN ODT) 4 MG disintegrating tablet, Take 1  tablet (4 mg total) by mouth every 8 (eight) hours as needed for nausea or vomiting., Disp: 20 tablet, Rfl: 0   pimecrolimus (ELIDEL) 1 % cream, APPLY EXTERNALLY TO THE AFFECTED AREA TWICE DAILY, Disp: 100 g, Rfl: 0   triamcinolone cream (KENALOG) 0.1 %, Apply topically 2 (two) times daily., Disp: 453.6 g, Rfl: 0   valACYclovir (VALTREX) 500 MG tablet, Take 1 tablet (500 mg total) by mouth 2 (two) times daily as needed. Prn outbreak x 3 days, Disp: 30 tablet, Rfl: 1  No Known Allergies   ROS  ***  Objective  There were no vitals filed for this visit.  There is no height or weight on file to calculate BMI.  Physical Exam ***  Recent Results (from the past 2160 hour(s))  Cervicovaginal ancillary only     Status: Abnormal   Collection Time: 04/23/21 10:16 AM  Result Value Ref Range   Neisseria Gonorrhea Negative    Chlamydia Negative    Trichomonas Negative    Bacterial Vaginitis (gardnerella) Positive (A)    Candida Vaginitis Negative    Candida Glabrata Negative    Comment      Normal Reference Range Bacterial Vaginosis - Negative   Comment Normal Reference Range Candida Species - Negative    Comment Normal Reference Range Candida Galbrata - Negative    Comment Normal Reference Range Trichomonas - Negative    Comment Normal Reference Ranger Chlamydia - Negative    Comment      Normal Reference Range Neisseria Gonorrhea - Negative    Diabetic Foot Exam: Diabetic Foot Exam - Simple   No data filed    ***  Fall Risk: Fall Risk  06/13/2021  04/09/2021 03/05/2021 05/04/2020 02/08/2020  Falls in the past year? 0 0 0 1 0  Number falls in past yr: 0 0 - 1 0  Injury with Fall? 0 0 - 0 0  Follow up Falls evaluation completed - Falls prevention discussed - -   ***  Functional Status Survey:   ***  Assessment & Plan  1. Well adult exam ***   -USPSTF grade A and B recommendations reviewed with patient; age-appropriate recommendations, preventive care, screening tests, etc discussed and encouraged; healthy living encouraged; see AVS for patient education given to patient -Discussed importance of 150 minutes of physical activity weekly, eat two servings of fish weekly, eat one serving of tree nuts ( cashews, pistachios, pecans, almonds.Marland Kitchen) every other day, eat 6 servings of fruit/vegetables daily and drink plenty of water and avoid sweet beverages.   -Reviewed Health Maintenance: ***

## 2021-06-27 ENCOUNTER — Encounter: Payer: Medicaid Other | Admitting: Family Medicine

## 2021-06-27 DIAGNOSIS — Z124 Encounter for screening for malignant neoplasm of cervix: Secondary | ICD-10-CM

## 2021-06-27 DIAGNOSIS — Z0000 Encounter for general adult medical examination without abnormal findings: Secondary | ICD-10-CM

## 2021-07-09 NOTE — Progress Notes (Deleted)
Name: Lisa House   MRN: [id number]    DOB: 12/26/1990   Date:07/09/2021       Progress Note  Subjective  Chief Complaint  Follow Up  HPI  Nausea/GERD: she states skips breakfast , and sometimes has a protein bar, she packs her lunch ( salads and or sandwiches ) she has a very small dinner/more like a snack like chips, sometimes home meals - husband drops it off. She has a cancer phobia and is worried about her symptoms. Nausea is worse in the evenings. Occasionally she vomits. She sates it does not have any relation to meals. Episodes are about three times a week. She is also feeling tired. No change in bowel movements. She has epigastric burning that is intermittent, sometimes radiates to sub-sternal area. Some food also causes burning going down her esophagus.   OCD/Panic attack and depression: episodes of anxiety and panic attacks and OCD all started when her father died from complications of lung cancer, she witnessed his death when he had hemoptysis and died, she never had therapy. She states her son was born about 67 months prior to her father dying, she was really worried about him getting sick, she started to wash hands constantly, wipe her counters, mops her floors every day. She took SSRI in the past but does not want to take anything at this time. She states she can cope with her stress.    Eczema: she washes her hands constantly, causes her hands to crack.   Psoriasis:she was diagnosed with psoriasis, only spot now is on ventral aspect of right wrist, she uses topical medication. She works at [employer] and picking up boxes rubs on her arm and aggravates it   Headaches: she states taking Elavil and seems to decrease frequency of episodes. She states episodes of migraines were daily and is now down to twice a week - she states she has to have 3 wisdom teeth removed and is hoping it will decrease frequency of episodes.   Asthma Moderate persistent: she was given albuterol when  she was pregnant 4 years ago, she states wheezes every night, denies dry cough or sob.    Post-coital bleeding: she will contact gyn for an appointment   Patient Active Problem List   Diagnosis Date Noted   Lumbar radiculopathy 04/27/2020   Neck pain 02/14/2020   Right ovarian cyst 08/16/2019   Family history of congenital aortic stenosis 09/23/2017   Herpes genitalis 02/08/2017   IBS (irritable bowel syndrome) 05/09/2015   Morbid obesity ([facility]) 05/09/2015   OCD (obsessive compulsive disorder) 05/09/2015   Bilateral polycystic ovarian syndrome 05/09/2015   Psoriasis 05/09/2015   History of depression 05/09/2015   Allergic rhinitis 05/02/2010    Past Surgical History:  Procedure Laterality Date   CHOLECYSTECTOMY  01/09/2011   Dr. Excell Seltzer    Family History  Problem Relation Age of Onset   Hypertension Mother    Diabetes Mother    Headache Mother    Stroke Mother    Hypertension Father    Lung cancer Father    Asthma Father    Heart disease Son        Aortic Valve Disorder Stenosis-had balloon valvuloplasty, on meds   Ovarian cancer Maternal Grandmother    Colon cancer Maternal Grandfather    Cleft palate Son     Social History   Tobacco Use   Smoking status: Former   Smokeless tobacco: Never  Substance Use Topics   Alcohol use: No  Alcohol/week: 0.0 standard drinks     Current Outpatient Medications:    albuterol (PROVENTIL HFA) 108 (90 Base) MCG/ACT inhaler, Inhale 2 puffs into the lungs every 4 (four) hours as needed for wheezing or shortness of breath., Disp: 1 each, Rfl: 0   levonorgestrel (MIRENA, 52 MG,) 20 MCG/24HR IUD, 1 Intra Uterine Device (1 each total) by Intrauterine route once for 1 dose., Disp: 1 each, Rfl: 0   omeprazole (PRILOSEC) 40 MG capsule, Take 1 capsule (40 mg total) by mouth daily., Disp: 90 capsule, Rfl: 0   ondansetron (ZOFRAN ODT) 4 MG disintegrating tablet, Take 1 tablet (4 mg total) by mouth every 8 (eight) hours as needed for  nausea or vomiting., Disp: 20 tablet, Rfl: 0   pimecrolimus (ELIDEL) 1 % cream, APPLY EXTERNALLY TO THE AFFECTED AREA TWICE DAILY, Disp: 100 g, Rfl: 0   triamcinolone cream (KENALOG) 0.1 %, Apply topically 2 (two) times daily., Disp: 453.6 g, Rfl: 0   valACYclovir (VALTREX) 500 MG tablet, Take 1 tablet (500 mg total) by mouth 2 (two) times daily as needed. Prn outbreak x 3 days, Disp: 30 tablet, Rfl: 1  No Known Allergies  I personally reviewed {Reviewed:14835} with the patient/caregiver today.   ROS  ***  Objective  There were no vitals filed for this visit.  There is no height or weight on file to calculate BMI.  Physical Exam ***  Recent Results (from the past 2160 hour(s))  Cervicovaginal ancillary only     Status: Abnormal   Collection Time: 04/23/21 10:16 AM  Result Value Ref Range   Neisseria Gonorrhea Negative    Chlamydia Negative    Trichomonas Negative    Bacterial Vaginitis (gardnerella) Positive (A)    Candida Vaginitis Negative    Candida Glabrata Negative    Comment      Normal Reference Range Bacterial Vaginosis - Negative   Comment Normal Reference Range Candida Species - Negative    Comment Normal Reference Range Candida Galbrata - Negative    Comment Normal Reference Range Trichomonas - Negative    Comment Normal Reference Ranger Chlamydia - Negative    Comment      Normal Reference Range Neisseria Gonorrhea - Negative    Diabetic Foot Exam: Diabetic Foot Exam - Simple   No data filed    ***  PHQ2/9: Depression screen [facility] 2/9 06/13/2021 04/09/2021 03/05/2021 05/04/2020 02/08/2020  Decreased Interest 0 1 1 0 1  Down, Depressed, Hopeless 0 1 1 0 1  PHQ - 2 Score 0 2 2 0 2  Altered sleeping - 1 0 0 0  Tired, decreased energy - 3 1 0 1  Change in appetite - 0 0 0 0  Feeling bad or failure about yourself  - 0 0 0 0  Trouble concentrating - 0 0 0 0  Moving slowly or fidgety/restless - 0 0 0 0  Suicidal thoughts - 0 0 0 0  PHQ-9 Score - 6 3 0 3   Difficult doing work/chores - - Not difficult at all Not difficult at all Not difficult at all  Some recent data might be hidden    phq 9 is {gen pos WUJ:811914} ***  Fall Risk: Fall Risk  06/13/2021 04/09/2021 03/05/2021 05/04/2020 02/08/2020  Falls in the past year? 0 0 0 1 0  Number falls in past yr: 0 0 - 1 0  Injury with Fall? 0 0 - 0 0  Follow up Falls evaluation completed - Falls prevention discussed - -   ***  Functional Status Survey:   ***   Assessment & Plan  *** There are no diagnoses linked to this encounter.

## 2021-07-10 ENCOUNTER — Ambulatory Visit: Payer: Medicaid Other | Admitting: Family Medicine

## 2021-07-17 ENCOUNTER — Other Ambulatory Visit: Payer: Self-pay

## 2021-07-17 ENCOUNTER — Emergency Department: Payer: Medicaid Other

## 2021-07-17 ENCOUNTER — Emergency Department
Admission: EM | Admit: 2021-07-17 | Discharge: 2021-07-17 | Disposition: A | Discharge status: Home / Self Care | Payer: Medicaid Other | Attending: Emergency Medicine | Admitting: Emergency Medicine

## 2021-07-17 DIAGNOSIS — Z7951 Long term (current) use of inhaled steroids: Secondary | ICD-10-CM | POA: Insufficient documentation

## 2021-07-17 DIAGNOSIS — R002 Palpitations: Secondary | ICD-10-CM | POA: Diagnosis present

## 2021-07-17 DIAGNOSIS — F17210 Nicotine dependence, cigarettes, uncomplicated: Secondary | ICD-10-CM | POA: Insufficient documentation

## 2021-07-17 DIAGNOSIS — F419 Anxiety disorder, unspecified: Secondary | ICD-10-CM | POA: Insufficient documentation

## 2021-07-17 LAB — CBC
HCT: 40.8 % (ref 36.0–46.0)
Hemoglobin: 13.6 g/dL (ref 12.0–15.0)
MCH: 27.9 pg (ref 26.0–34.0)
MCHC: 33.3 g/dL (ref 30.0–36.0)
MCV: 83.6 fL (ref 80.0–100.0)
Platelets: 214 10*3/uL (ref 150–400)
RBC: 4.88 MIL/uL (ref 3.87–5.11)
RDW: 13.1 % (ref 11.5–15.5)
WBC: 7.1 10*3/uL (ref 4.0–10.5)
nRBC: 0 % (ref 0.0–0.2)

## 2021-07-17 LAB — BASIC METABOLIC PANEL
Anion gap: 7 (ref 5–15)
BUN: 12 mg/dL (ref 6–20)
CO2: 27 mmol/L (ref 22–32)
Calcium: 9.2 mg/dL (ref 8.9–10.3)
Chloride: 101 mmol/L (ref 98–111)
Creatinine, Ser: 0.77 mg/dL (ref 0.44–1.00)
GFR, Estimated: >60 mL/min (ref >60)
Glucose, Bld: 97 mg/dL (ref 70–99)
Potassium: 4.2 mmol/L (ref 3.5–5.1)
Sodium: 135 mmol/L (ref 135–145)

## 2021-07-17 LAB — TROPONIN I (HIGH SENSITIVITY): Troponin I (High Sensitivity): <2 ng/L (ref <18)

## 2021-07-17 LAB — POC URINE PREG, ED: Preg Test, Ur: NEGATIVE

## 2021-07-17 MED ORDER — PROPRANOLOL HCL 20 MG PO TABS: 20.0000 mg | ORAL_TABLET | Freq: Three times a day (TID) | ORAL | 0 refills | Status: DC | PRN

## 2021-07-17 NOTE — ED Notes (Signed)
Pt c/o "weird feeling" in head while driving and heart palpitations.  Pt reports palpitations increased w/ hyperventilation and legs began to tingling.  Pt reports pulling over, slowing breathing, and symptoms subsided.  Pt reports similar episode this morning while driving and utilized breathing technique again.  Pt reports being under significant stress.  Pt reports 3 fingers in L hand "going numb" this morning, but history of carpal tunnel.

## 2021-07-17 NOTE — ED Provider Notes (Signed)
[facility] Emergency Department Provider Note  ____________________________________________  Time seen: Approximately 11:32 AM  I have reviewed the triage vital signs and the nursing notes.   HISTORY  Chief Complaint Palpitations    HPI Lisa House is a 30 y.o. female with a history of OCD, panic attacks, GERD, ovarian cyst who comes ED complaining of episodes of palpitations, racing heart, anxiety over the last 2 days.  No aggravating or alleviating factors.  Has occurred twice, lasted for 30 minutes to an hour, and then resolved on its own.  No chest pain or shortness of breath, currently feels back to normal.  No dizziness or syncope.  She does note increased situational stressors currently, including her brother recently having some heart surgery and her son needing to undergo heart surgery.    Past Medical History:  Diagnosis Date   Allergy    Depression    Eczema    GERD (gastroesophageal reflux disease)    Intertrigo    Obesity    OCD (obsessive compulsive disorder)    Panic attack      Patient Active Problem List   Diagnosis Date Noted   Lumbar radiculopathy 04/27/2020   Neck pain 02/14/2020   Right ovarian cyst 08/16/2019   Family history of congenital aortic stenosis 09/23/2017   Herpes genitalis 02/08/2017   IBS (irritable bowel syndrome) 05/09/2015   Morbid obesity ([facility]) 05/09/2015   OCD (obsessive compulsive disorder) 05/09/2015   Bilateral polycystic ovarian syndrome 05/09/2015   Psoriasis 05/09/2015   History of depression 05/09/2015   Allergic rhinitis 05/02/2010     Past Surgical History:  Procedure Laterality Date   CHOLECYSTECTOMY  01/09/2011   Dr. Excell Seltzer     Prior to Admission medications   Medication Sig Start Date End Date Taking? Authorizing Provider  propranolol (INDERAL) 20 MG tablet Take 1 tablet (20 mg total) by mouth 3 (three) times daily as needed. 07/17/21  Yes Sharman Cheek, MD  albuterol  (PROVENTIL HFA) 108 (90 Base) MCG/ACT inhaler Inhale 2 puffs into the lungs every 4 (four) hours as needed for wheezing or shortness of breath. 02/19/21   Sharman Cheek, MD  levonorgestrel (MIRENA, 52 MG,) 20 MCG/24HR IUD 1 Intra Uterine Device (1 each total) by Intrauterine route once for 1 dose. 06/15/18 06/15/18  [name], MD  omeprazole (PRILOSEC) 40 MG capsule Take 1 capsule (40 mg total) by mouth daily. 04/09/21   [name], MD  ondansetron (ZOFRAN ODT) 4 MG disintegrating tablet Take 1 tablet (4 mg total) by mouth every 8 (eight) hours as needed for nausea or vomiting. 06/13/21   Gabriel Cirri, NP  pimecrolimus (ELIDEL) 1 % cream APPLY EXTERNALLY TO THE AFFECTED AREA TWICE DAILY 04/09/21   [name], MD  triamcinolone cream (KENALOG) 0.1 % Apply topically 2 (two) times daily. 04/09/21   [name], MD  valACYclovir (VALTREX) 500 MG tablet Take 1 tablet (500 mg total) by mouth 2 (two) times daily as needed. Prn outbreak x 3 days 04/09/21   [name], MD     Allergies Patient has no known allergies.   Family History  Problem Relation Age of Onset   Hypertension Mother    Diabetes Mother    Headache Mother    Stroke Mother    Hypertension Father    Lung cancer Father    Asthma Father    Heart disease Son        Aortic Valve Disorder Stenosis-had balloon valvuloplasty, on meds   Ovarian cancer  Maternal Grandmother    Colon cancer Maternal Grandfather    Cleft palate Son     Social History Social History   Tobacco Use   Smoking status: Every Day    Types: Cigarettes   Smokeless tobacco: Never  Vaping Use   Vaping Use: Never used  Substance Use Topics   Alcohol use: No    Alcohol/week: 0.0 standard drinks   Drug use: No    Review of Systems  Constitutional:   No fever or chills.  ENT:   No sore throat. No rhinorrhea. Cardiovascular:   No chest pain or syncope.  Positive palpitations Respiratory:   No dyspnea or cough. Gastrointestinal:    Negative for abdominal pain, vomiting and diarrhea.  Musculoskeletal:   Negative for focal pain or swelling All other systems reviewed and are negative except as documented above in ROS and HPI.  ____________________________________________   PHYSICAL EXAM:  VITAL SIGNS: ED Triage Vitals  Enc Vitals Group     BP 07/17/21 0938 122/72     Pulse Rate 07/17/21 0938 72     Resp 07/17/21 0938 14     Temp 07/17/21 0938 98.4 F (36.9 C)     Temp Source 07/17/21 0938 Oral     SpO2 07/17/21 0938 95 %     Weight 07/17/21 0935 239 lb (108.4 kg)     Height 07/17/21 0935 5\' 6"  (1.676 m)     Head Circumference --      Peak Flow --      Pain Score 07/17/21 0935 0     Pain Loc --      Pain Edu? --      Excl. in GC? --     Vital signs reviewed, nursing assessments reviewed.   Constitutional:   Alert and oriented. Non-toxic appearance. Eyes:   Conjunctivae are normal. EOMI. PERRL. ENT      Head:   Normocephalic and atraumatic.      Nose:   Wearing a mask.      Mouth/Throat:   Wearing a mask.      Neck:   No meningismus. Full ROM. Hematological/Lymphatic/Immunilogical:   No cervical lymphadenopathy. Cardiovascular:   RRR. Symmetric bilateral radial and DP pulses.  No murmurs. Cap refill less than 2 seconds. Respiratory:   Normal respiratory effort without tachypnea/retractions. Breath sounds are clear and equal bilaterally. No wheezes/rales/rhonchi. Gastrointestinal:   Soft and nontender. Non distended. There is no CVA tenderness.  No rebound, rigidity, or guarding. Genitourinary:   deferred Musculoskeletal:   Normal range of motion in all extremities. No joint effusions.  No lower extremity tenderness.  No edema. Neurologic:   Normal speech and language.  Motor grossly intact. No acute focal neurologic deficits are appreciated.  Skin:    Skin is warm, dry and intact. No rash noted.  No petechiae, purpura, or bullae.  ____________________________________________    LABS (pertinent  positives/negatives) (all labs ordered are listed, but only abnormal results are displayed) Labs Reviewed  BASIC METABOLIC PANEL  CBC  POC URINE PREG, ED  TROPONIN I (HIGH SENSITIVITY)  TROPONIN I (HIGH SENSITIVITY)   ____________________________________________   EKG  Interpreted by me  Date: 07/17/2021  Rate: 70  Rhythm: normal sinus rhythm  QRS Axis: normal  Intervals: normal  ST/T Wave abnormalities: normal  Conduction Disutrbances: none  Narrative Interpretation: unremarkable     ____________________________________________    RADIOLOGY  DG Chest 2 View  Result Date: 07/17/2021 CLINICAL DATA:  chest pain EXAM: CHEST - 2  VIEW COMPARISON:  February 19, 2021. FINDINGS: The heart size and mediastinal contours are within normal limits. Both lungs are clear. No visible pleural effusions or pneumothorax. No acute osseous abnormality. IMPRESSION: No active cardiopulmonary disease. Electronically Signed   By: Feliberto Harts M.D.   On: 07/17/2021 10:17    ____________________________________________   PROCEDURES Procedures  ____________________________________________    CLINICAL IMPRESSION / ASSESSMENT AND PLAN / ED COURSE  Medications ordered in the ED: Medications - No data to display  Pertinent labs & imaging results that were available during my care of the patient were reviewed by me and considered in my medical decision making (see chart for details).  Lisa House was evaluated in Emergency Department on 07/17/2021 for the symptoms described in the history of present illness. She was evaluated in the context of the global COVID-19 pandemic, which necessitated consideration that the patient might be at risk for infection with the SARS-CoV-2 virus that causes COVID-19. Institutional protocols and algorithms that pertain to the evaluation of patients at risk for COVID-19 are in a state of rapid change based on information released by regulatory bodies including  the CDC and federal and state organizations. These policies and algorithms were followed during the patient's care in the ED.   Patient presents with palpitations and anxiety symptoms. Considering the patient's symptoms, medical history, and physical examination today, I have low suspicion for ACS, PE, TAD, pneumothorax, carditis, hyperthyroidism, mediastinitis, pneumonia, CHF, or sepsis.  Trial of propranolol as needed, follow-up with primary care      ____________________________________________   FINAL CLINICAL IMPRESSION(S) / ED DIAGNOSES    Final diagnoses:  Anxiety  Palpitations     ED Discharge Orders          Ordered    propranolol (INDERAL) 20 MG tablet  3 times daily PRN        07/17/21 1131            Portions of this note were generated with dragon dictation software. Dictation errors may occur despite best attempts at proofreading.    Sharman Cheek, MD 07/17/21 1134

## 2021-07-17 NOTE — ED Notes (Signed)
Pt given Sprite and graham crackers

## 2021-07-17 NOTE — ED Triage Notes (Signed)
C/O intermittent episodes (x 2) of palpitations and left arm tingling.  AAOx3. Skin warm and dry. NAD

## 2021-07-17 NOTE — ED Notes (Signed)
Patient transported to X-ray 

## 2021-07-17 NOTE — ED Notes (Signed)
Pt c/o nausea d/t not eating this morning.

## 2021-08-24 ENCOUNTER — Other Ambulatory Visit: Payer: Self-pay

## 2021-08-24 ENCOUNTER — Emergency Department
Admission: EM | Admit: 2021-08-24 | Discharge: 2021-08-24 | Disposition: A | Discharge status: Home / Self Care | Payer: Medicaid Other | Attending: Emergency Medicine | Admitting: Emergency Medicine

## 2021-08-24 ENCOUNTER — Encounter: Payer: Self-pay | Admitting: Emergency Medicine

## 2021-08-24 ENCOUNTER — Emergency Department: Payer: Medicaid Other

## 2021-08-24 DIAGNOSIS — R519 Headache, unspecified: Secondary | ICD-10-CM | POA: Diagnosis present

## 2021-08-24 DIAGNOSIS — J0120 Acute ethmoidal sinusitis, unspecified: Secondary | ICD-10-CM | POA: Diagnosis not present

## 2021-08-24 DIAGNOSIS — F17210 Nicotine dependence, cigarettes, uncomplicated: Secondary | ICD-10-CM | POA: Insufficient documentation

## 2021-08-24 DIAGNOSIS — Z79899 Other long term (current) drug therapy: Secondary | ICD-10-CM | POA: Insufficient documentation

## 2021-08-24 LAB — URINALYSIS, COMPLETE (UACMP) WITH MICROSCOPIC
Bilirubin Urine: NEGATIVE
Glucose, UA: NEGATIVE mg/dL
Ketones, ur: NEGATIVE mg/dL
Leukocytes,Ua: NEGATIVE
Nitrite: NEGATIVE
Protein, ur: NEGATIVE mg/dL
Specific Gravity, Urine: 1.014 (ref 1.005–1.030)
pH: 7 (ref 5.0–8.0)

## 2021-08-24 LAB — CBC WITH DIFFERENTIAL/PLATELET
Abs Immature Granulocytes: 0.02 10*3/uL (ref 0.00–0.07)
Basophils Absolute: 0.1 10*3/uL (ref 0.0–0.1)
Basophils Relative: 1 %
Eosinophils Absolute: 0.6 10*3/uL — ABNORMAL HIGH (ref 0.0–0.5)
Eosinophils Relative: 8 %
HCT: 42.6 % (ref 36.0–46.0)
Hemoglobin: 14 g/dL (ref 12.0–15.0)
Immature Granulocytes: 0 %
Lymphocytes Relative: 35 %
Lymphs Abs: 2.8 10*3/uL (ref 0.7–4.0)
MCH: 28.1 pg (ref 26.0–34.0)
MCHC: 32.9 g/dL (ref 30.0–36.0)
MCV: 85.5 fL (ref 80.0–100.0)
Monocytes Absolute: 0.4 10*3/uL (ref 0.1–1.0)
Monocytes Relative: 5 %
Neutro Abs: 4.2 10*3/uL (ref 1.7–7.7)
Neutrophils Relative %: 51 %
Platelets: 223 10*3/uL (ref 150–400)
RBC: 4.98 MIL/uL (ref 3.87–5.11)
RDW: 13 % (ref 11.5–15.5)
WBC: 8.1 10*3/uL (ref 4.0–10.5)
nRBC: 0 % (ref 0.0–0.2)

## 2021-08-24 LAB — COMPREHENSIVE METABOLIC PANEL
ALT: 23 U/L (ref 0–44)
AST: 24 U/L (ref 15–41)
Albumin: 4.1 g/dL (ref 3.5–5.0)
Alkaline Phosphatase: 82 U/L (ref 38–126)
Anion gap: 8 (ref 5–15)
BUN: 11 mg/dL (ref 6–20)
CO2: 29 mmol/L (ref 22–32)
Calcium: 9.2 mg/dL (ref 8.9–10.3)
Chloride: 97 mmol/L — ABNORMAL LOW (ref 98–111)
Creatinine, Ser: 0.83 mg/dL (ref 0.44–1.00)
GFR, Estimated: >60 mL/min (ref >60)
Glucose, Bld: 96 mg/dL (ref 70–99)
Potassium: 4 mmol/L (ref 3.5–5.1)
Sodium: 134 mmol/L — ABNORMAL LOW (ref 135–145)
Total Bilirubin: 0.4 mg/dL (ref 0.3–1.2)
Total Protein: 8.1 g/dL (ref 6.5–8.1)

## 2021-08-24 LAB — URINE DRUG SCREEN, QUALITATIVE (ARMC ONLY)
Amphetamines, Ur Screen: NOT DETECTED
Barbiturates, Ur Screen: NOT DETECTED
Benzodiazepine, Ur Scrn: NOT DETECTED
Cannabinoid 50 Ng, Ur ~~LOC~~: NOT DETECTED
Cocaine Metabolite,Ur ~~LOC~~: NOT DETECTED
MDMA (Ecstasy)Ur Screen: NOT DETECTED
Methadone Scn, Ur: NOT DETECTED
Opiate, Ur Screen: POSITIVE — AB
Phencyclidine (PCP) Ur S: NOT DETECTED
Tricyclic, Ur Screen: NOT DETECTED

## 2021-08-24 LAB — POC URINE PREG, ED: Preg Test, Ur: NEGATIVE

## 2021-08-24 MED ORDER — FEXOFENADINE-PSEUDOEPHED ER 60-120 MG PO TB12: 1.0000 | ORAL_TABLET | Freq: Two times a day (BID) | ORAL | 0 refills | Status: DC

## 2021-08-24 MED ORDER — AMOXICILLIN 875 MG PO TABS
875.0000 mg | ORAL_TABLET | Freq: Two times a day (BID) | ORAL | 0 refills | Status: DC
Start: 2021-08-24 — End: 2021-09-29

## 2021-08-24 NOTE — ED Provider Notes (Signed)
[facility] Emergency Department Provider Note   ____________________________________________   Event Date/Time   First MD Initiated Contact with Patient 08/24/21 1307     (approximate)  I have reviewed the triage vital signs and the nursing notes.   HISTORY  Chief Complaint Sinus Problem and Facial Pain   HPI Lisa House is a 30 y.o. female patient presents for frontal facial pain and states she feels like she is "dying".  Patient states past 2 weeks she is measuring complaint of PCP possibly was not pain attention to her.  Patient states frontal headache.  Patient has vision disturbance, vertigo, vision change, or weakness.  Patient states she has a fear of having cancer.  No specific reason why she is worried about this complaint.  Past medical history reveal history of anxiety.  Patient was seen in this facility last month diagnosed anxiety and palpitations.  Patient was placed on Inderal 20 mg as needed.  Patient stated noticed no change with medication.      Past Medical History:  Diagnosis Date   Allergy    Depression    Eczema    GERD (gastroesophageal reflux disease)    Intertrigo    Obesity    OCD (obsessive compulsive disorder)    Panic attack     Patient Active Problem List   Diagnosis Date Noted   Lumbar radiculopathy 04/27/2020   Neck pain 02/14/2020   Right ovarian cyst 08/16/2019   Family history of congenital aortic stenosis 09/23/2017   Herpes genitalis 02/08/2017   IBS (irritable bowel syndrome) 05/09/2015   Morbid obesity (HCC) 05/09/2015   OCD (obsessive compulsive disorder) 05/09/2015   Bilateral polycystic ovarian syndrome 05/09/2015   Psoriasis 05/09/2015   History of depression 05/09/2015   Allergic rhinitis 05/02/2010    Past Surgical History:  Procedure Laterality Date   CHOLECYSTECTOMY  01/09/2011   Dr. Excell Seltzer    Prior to Admission medications   Medication Sig Start Date End Date Taking? Authorizing  Provider  amoxicillin (AMOXIL) 875 MG tablet Take 1 tablet (875 mg total) by mouth 2 (two) times daily. 08/24/21  Yes Joni Reining, PA-C  fexofenadine-pseudoephedrine (ALLEGRA-D) 60-120 MG 12 hr tablet Take 1 tablet by mouth 2 (two) times daily. 08/24/21  Yes Joni Reining, PA-C  albuterol (PROVENTIL HFA) 108 (90 Base) MCG/ACT inhaler Inhale 2 puffs into the lungs every 4 (four) hours as needed for wheezing or shortness of breath. 02/19/21   Sharman Cheek, MD  levonorgestrel (MIRENA, 52 MG,) 20 MCG/24HR IUD 1 Intra Uterine Device (1 each total) by Intrauterine route once for 1 dose. 06/15/18 06/15/18  [name], MD  omeprazole (PRILOSEC) 40 MG capsule Take 1 capsule (40 mg total) by mouth daily. 04/09/21   [name], MD  ondansetron (ZOFRAN ODT) 4 MG disintegrating tablet Take 1 tablet (4 mg total) by mouth every 8 (eight) hours as needed for nausea or vomiting. 06/13/21   Gabriel Cirri, NP  pimecrolimus (ELIDEL) 1 % cream APPLY EXTERNALLY TO THE AFFECTED AREA TWICE DAILY 04/09/21   [name], MD  propranolol (INDERAL) 20 MG tablet Take 1 tablet (20 mg total) by mouth 3 (three) times daily as needed. 07/17/21   Sharman Cheek, MD  triamcinolone cream (KENALOG) 0.1 % Apply topically 2 (two) times daily. 04/09/21   [name], MD  valACYclovir (VALTREX) 500 MG tablet Take 1 tablet (500 mg total) by mouth 2 (two) times daily as needed. Prn outbreak x 3 days 04/09/21  [name], MD    Allergies Patient has no known allergies.  Family History  Problem Relation Age of Onset   Hypertension Mother    Diabetes Mother    Headache Mother    Stroke Mother    Hypertension Father    Lung cancer Father    Asthma Father    Heart disease Son        Aortic Valve Disorder Stenosis-had balloon valvuloplasty, on meds   Ovarian cancer Maternal Grandmother    Colon cancer Maternal Grandfather    Cleft palate Son     Social History Social History   Tobacco Use   Smoking  status: Every Day    Types: Cigarettes   Smokeless tobacco: Never  Vaping Use   Vaping Use: Never used  Substance Use Topics   Alcohol use: No    Alcohol/week: 0.0 standard drinks   Drug use: No    Review of Systems Constitutional: No fever/chills Eyes: No visual changes. ENT: No sore throat. Cardiovascular: Denies chest pain. Respiratory: Denies shortness of breath. Gastrointestinal: No abdominal pain.  No nausea, no vomiting.  No diarrhea.  No constipation. Genitourinary: Negative for dysuria. Musculoskeletal: Negative for back pain. Skin: Negative for rash. Neurological: Negative for headaches, focal weakness or numbness. Psychiatric: Depression, OCD, panic attacks.   ____________________________________________   PHYSICAL EXAM:  VITAL SIGNS: ED Triage Vitals  Enc Vitals Group     BP 08/24/21 1253 (!) 108/59     Pulse Rate 08/24/21 1253 77     Resp 08/24/21 1253 18     Temp 08/24/21 1253 98.3 F (36.8 C)     Temp Source 08/24/21 1253 Oral     SpO2 08/24/21 1253 98 %     Weight 08/24/21 1243 240 lb 4.8 oz (109 kg)     Height 08/24/21 1243 5\' 6"  (1.676 m)     Head Circumference --      Peak Flow --      Pain Score 08/24/21 1243 2     Pain Loc --      Pain Edu? --      Excl. in GC? --     Constitutional: Alert and oriented. Well appearing and in no acute distress. Eyes: Conjunctivae are normal. PERRL. EOMI. Head: Atraumatic. Nose: Edematous nasal turbinates.   Mouth/Throat: Mucous membranes are moist.  Oropharynx non-erythematous. Neck: No cervical spine tenderness to palpation. Hematological/Lymphatic/Immunilogical: No cervical lymphadenopathy. Cardiovascular: Normal rate, regular rhythm. Grossly normal heart sounds.  Good peripheral circulation. Respiratory: Normal respiratory effort.  No retractions. Lungs CTAB. Gastrointestinal: Soft and nontender. No distention. No abdominal bruits. No CVA tenderness. Musculoskeletal: No lower extremity tenderness nor  edema.  No joint effusions. Neurologic:  Normal speech and language. No gross focal neurologic deficits are appreciated. No gait instability. Skin:  Skin is warm, dry and intact. No rash noted. Psychiatric: Mood and affect are normal. Speech and behavior are normal.  ____________________________________________   LABS (all labs ordered are listed, but only abnormal results are displayed)  Labs Reviewed  CBC WITH DIFFERENTIAL/PLATELET - Abnormal; Notable for the following components:      Result Value   Eosinophils Absolute 0.6 (*)    All other components within normal limits  COMPREHENSIVE METABOLIC PANEL - Abnormal; Notable for the following components:   Sodium 134 (*)    Chloride 97 (*)    All other components within normal limits  URINALYSIS, COMPLETE (UACMP) WITH MICROSCOPIC - Abnormal; Notable for the following components:   Color, Urine YELLOW (*)  APPearance CLEAR (*)    Hgb urine dipstick SMALL (*)    Bacteria, UA RARE (*)    All other components within normal limits  URINE DRUG SCREEN, QUALITATIVE ([facility] ONLY) - Abnormal; Notable for the following components:   Opiate, Ur Screen POSITIVE (*)    All other components within normal limits  POC URINE PREG, ED   ____________________________________________  EKG   ____________________________________________  RADIOLOGY I, Joni Reining, personally viewed and evaluated these images (plain radiographs) as part of my medical decision making, as well as reviewing the written report by the radiologist.  ED MD interpretation:    Official radiology report(s): CT Head Wo Contrast  Result Date: 08/24/2021 CLINICAL DATA:  Pressure behind eyes. EXAM: CT HEAD WITHOUT CONTRAST TECHNIQUE: Contiguous axial images were obtained from the base of the skull through the vertex without intravenous contrast. COMPARISON:  CT head 05/06/2010 FINDINGS: Brain: No evidence of acute infarction, hemorrhage, hydrocephalus, extra-axial  collection or mass lesion/mass effect. Vascular: No hyperdense vessel or unexpected calcification. Skull: Normal. Negative for fracture or focal lesion. Sinuses/Orbits: There is mucosal thickening involving the bilateral maxillary and ethmoid sinuses as well as fluid in the sphenoid sinus. Normal appearance of the orbits. Other: None. IMPRESSION: 1. No acute intracranial abnormality. 2. Bilateral maxillary, ethmoid, and sphenoid sinus disease. Electronically Signed   By: Emmaline Kluver M.D.   On: 08/24/2021 14:47    ____________________________________________   PROCEDURES  Procedure(s) performed (including Critical Care):  Procedures   ____________________________________________   INITIAL IMPRESSION / ASSESSMENT AND PLAN / ED COURSE  As part of my medical decision making, I reviewed the following data within the electronic MEDICAL RECORD NUMBER         Patient presents with sinus and facial pain.  Patient complains of increased drowsiness.  Discussed CT findings with patient consistent with sinusitis.  Patient urine drug screen is positive for opiates.  Patient given discharge care instructions and a prescription for amoxicillin and Allegra-D.  Patient vies follow-up with PCP.     ____________________________________________   FINAL CLINICAL IMPRESSION(S) / ED DIAGNOSES  Final diagnoses:  Subacute ethmoidal sinusitis     ED Discharge Orders          Ordered    amoxicillin (AMOXIL) 875 MG tablet  2 times daily        08/24/21 1520    fexofenadine-pseudoephedrine (ALLEGRA-D) 60-120 MG 12 hr tablet  2 times daily        08/24/21 1520             Note:  This document was prepared using Dragon voice recognition software and may include unintentional dictation errors.    Joni Reining, PA-C 08/24/21 1525    Sharyn Creamer, MD 08/24/21 [phone]

## 2021-08-24 NOTE — ED Notes (Signed)
Urine preg NEG. 

## 2021-08-24 NOTE — ED Notes (Signed)
See triage note. Pt in for sinus issue. Denies history of allergies. Denies congestion. Reports history of HA's but states once wisdom teeth were removed no more HA's. Reports "fluffy" or "fullness" feeling in her head that fluctuates. States feels extremely fatigued. Nausea x few months per pt. Reports her primary doc thinks it is from "acid reflux". Pt in NAD.

## 2021-08-24 NOTE — Discharge Instructions (Addendum)
Your CT scan of the head was remarkable for ethmoid sinusitis.  Read and follow discharge care instructions.  Take medication as directed.

## 2021-08-24 NOTE — ED Notes (Signed)
Will collect blood once pt back to room.

## 2021-08-24 NOTE — ED Triage Notes (Signed)
Pt reports for the last 4 days has had pressure like pain in her face and head. Pt reports has also been really sleepy, more than usual

## 2021-09-06 ENCOUNTER — Other Ambulatory Visit: Payer: Self-pay | Admitting: Physician Assistant

## 2021-09-29 ENCOUNTER — Emergency Department
Admission: EM | Admit: 2021-09-29 | Discharge: 2021-09-29 | Disposition: A | Discharge status: Home / Self Care | Payer: Medicaid Other | Attending: Student in an Organized Health Care Education/Training Program | Admitting: Student in an Organized Health Care Education/Training Program

## 2021-09-29 ENCOUNTER — Emergency Department: Payer: Medicaid Other

## 2021-09-29 ENCOUNTER — Encounter: Payer: Self-pay | Admitting: Emergency Medicine

## 2021-09-29 ENCOUNTER — Other Ambulatory Visit: Payer: Self-pay

## 2021-09-29 DIAGNOSIS — F17210 Nicotine dependence, cigarettes, uncomplicated: Secondary | ICD-10-CM | POA: Diagnosis not present

## 2021-09-29 DIAGNOSIS — J189 Pneumonia, unspecified organism: Secondary | ICD-10-CM | POA: Insufficient documentation

## 2021-09-29 DIAGNOSIS — R059 Cough, unspecified: Secondary | ICD-10-CM | POA: Diagnosis present

## 2021-09-29 MED ORDER — AMOXICILLIN 875 MG PO TABS: 875.0000 mg | ORAL_TABLET | Freq: Two times a day (BID) | ORAL | 0 refills | Status: DC

## 2021-09-29 MED ORDER — HYDROCOD POLST-CPM POLST ER 10-8 MG/5ML PO SUER: 5.0000 mL | Freq: Two times a day (BID) | ORAL | 0 refills | Status: DC | PRN

## 2021-09-29 MED ORDER — AZITHROMYCIN 250 MG PO TABS: ORAL_TABLET | ORAL | 0 refills | Status: DC

## 2021-09-29 NOTE — ED Provider Notes (Signed)
[facility] Emergency Department Provider Note  ____________________________________________   Event Date/Time   First MD Initiated Contact with Patient 09/29/21 1204     (approximate)  I have reviewed the triage vital signs and the nursing notes.   HISTORY  Chief Complaint Cough    HPI JESSELYN House is a 30 y.o. female complains of sinus pressure, pain, cough and rib pain for 2 weeks.  Also complained of some frequency and dysuria.  No fever or chills at this time.  No chest pain or shortness of breath.  No abdominal pain  Past Medical History:  Diagnosis Date   Allergy    Depression    Eczema    GERD (gastroesophageal reflux disease)    Intertrigo    Obesity    OCD (obsessive compulsive disorder)    Panic attack     Patient Active Problem List   Diagnosis Date Noted   Lumbar radiculopathy 04/27/2020   Neck pain 02/14/2020   Right ovarian cyst 08/16/2019   Family history of congenital aortic stenosis 09/23/2017   Herpes genitalis 02/08/2017   IBS (irritable bowel syndrome) 05/09/2015   Morbid obesity (HCC) 05/09/2015   OCD (obsessive compulsive disorder) 05/09/2015   Bilateral polycystic ovarian syndrome 05/09/2015   Psoriasis 05/09/2015   History of depression 05/09/2015   Allergic rhinitis 05/02/2010    Past Surgical History:  Procedure Laterality Date   CHOLECYSTECTOMY  01/09/2011   Dr. Excell Seltzer    Prior to Admission medications   Medication Sig Start Date End Date Taking? Authorizing Provider  amoxicillin (AMOXIL) 875 MG tablet Take 1 tablet (875 mg total) by mouth 2 (two) times daily. 09/29/21  Yes Shaniah Baltes, Roselyn Bering, PA-C  azithromycin (ZITHROMAX Z-PAK) 250 MG tablet 2 pills today then 1 pill a day for 4 days 09/29/21  Yes Nashalie Sallis, Roselyn Bering, PA-C  chlorpheniramine-HYDROcodone [facility] PENNKINETIC ER) 10-8 MG/5ML SUER Take 5 mLs by mouth every 12 (twelve) hours as needed for cough. 09/29/21  Yes Tyreona Panjwani, Roselyn Bering, PA-C  albuterol  (PROVENTIL HFA) 108 (90 Base) MCG/ACT inhaler Inhale 2 puffs into the lungs every 4 (four) hours as needed for wheezing or shortness of breath. 02/19/21   Sharman Cheek, MD  fexofenadine-pseudoephedrine (ALLEGRA-D) 60-120 MG 12 hr tablet Take 1 tablet by mouth 2 (two) times daily. 08/24/21   Joni Reining, PA-C  levonorgestrel (MIRENA, 52 MG,) 20 MCG/24HR IUD 1 Intra Uterine Device (1 each total) by Intrauterine route once for 1 dose. 06/15/18 06/15/18  [name], MD  omeprazole (PRILOSEC) 40 MG capsule Take 1 capsule (40 mg total) by mouth daily. 04/09/21   [name], MD  ondansetron (ZOFRAN ODT) 4 MG disintegrating tablet Take 1 tablet (4 mg total) by mouth every 8 (eight) hours as needed for nausea or vomiting. 06/13/21   Gabriel Cirri, NP  pimecrolimus (ELIDEL) 1 % cream APPLY EXTERNALLY TO THE AFFECTED AREA TWICE DAILY 04/09/21   [name], MD  propranolol (INDERAL) 20 MG tablet Take 1 tablet (20 mg total) by mouth 3 (three) times daily as needed. 07/17/21   Sharman Cheek, MD  triamcinolone cream (KENALOG) 0.1 % Apply topically 2 (two) times daily. 04/09/21   [name], MD  valACYclovir (VALTREX) 500 MG tablet Take 1 tablet (500 mg total) by mouth 2 (two) times daily as needed. Prn outbreak x 3 days 04/09/21   [name], MD    Allergies Patient has no known allergies.  Family History  Problem Relation Age of Onset  Hypertension Mother    Diabetes Mother    Headache Mother    Stroke Mother    Hypertension Father    Lung cancer Father    Asthma Father    Heart disease Son        Aortic Valve Disorder Stenosis-had balloon valvuloplasty, on meds   Ovarian cancer Maternal Grandmother    Colon cancer Maternal Grandfather    Cleft palate Son     Social History Social History   Tobacco Use   Smoking status: Every Day    Types: Cigarettes   Smokeless tobacco: Never  Vaping Use   Vaping Use: Never used  Substance Use Topics   Alcohol use: No     Alcohol/week: 0.0 standard drinks   Drug use: No    Review of Systems  Constitutional: No fever/chills Eyes: No visual changes. ENT: No sore throat.  Positive sinus pain Respiratory: Positive cough Cardiovascular: Denies chest pain Gastrointestinal: Denies abdominal pain Genitourinary: Negative for dysuria. Musculoskeletal: Negative for back pain. Skin: Negative for rash. Psychiatric: no mood changes,     ____________________________________________   PHYSICAL EXAM:  VITAL SIGNS: ED Triage Vitals  Enc Vitals Group     BP 09/29/21 1023 121/74     Pulse Rate 09/29/21 1023 85     Resp 09/29/21 1023 18     Temp 09/29/21 1023 98.2 F (36.8 C)     Temp Source 09/29/21 1023 Oral     SpO2 09/29/21 1023 97 %     Weight 09/29/21 1022 220 lb (99.8 kg)     Height 09/29/21 1022 5\' 6"  (1.676 m)     Head Circumference --      Peak Flow --      Pain Score 09/29/21 1031 6     Pain Loc --      Pain Edu? --      Excl. in GC? --     Constitutional: Alert and oriented. Well appearing and in no acute distress. Eyes: Conjunctivae are normal.  Head: Atraumatic. Nose: No congestion/rhinnorhea. Mouth/Throat: Mucous membranes are moist.   Neck:  supple no lymphadenopathy noted Cardiovascular: Normal rate, regular rhythm. Heart sounds are normal Respiratory: Normal respiratory effort.  No retractions, lungs c t a  Abd: soft nontender bs normal all 4 quad GU: deferred Musculoskeletal: FROM all extremities, warm and well perfused Neurologic:  Normal speech and language.  Skin:  Skin is warm, dry and intact. No rash noted. Psychiatric: Mood and affect are normal. Speech and behavior are normal.  ____________________________________________   LABS (all labs ordered are listed, but only abnormal results are displayed)  Labs Reviewed - No data to display ____________________________________________   ____________________________________________  RADIOLOGY  Chest  x-ray  ____________________________________________   PROCEDURES  Procedure(s) performed: No  Procedures    ____________________________________________   INITIAL IMPRESSION / ASSESSMENT AND PLAN / ED COURSE  Pertinent labs & imaging results that were available during my care of the patient were reviewed by me and considered in my medical decision making (see chart for details).   Patient is a 30 year old female presents with URI symptoms.  See HPI.  Physical exam shows patient to be stable  Chest x-ray reviewed by me confirming radiology to have few opacities  Due to the patient's presentation we will place her on a Z-Pak back and amoxicillin, Tussionex for cough.  She is to follow-up with her regular doctor if not improving to 3 days.  Patient states that its not harsh pain when she  urinates so she feels that antibiotics given for pneumonia should be sufficient.  Will defer the UA and respiratory panel at this time.  She was discharged in stable condition.     Lisa House was evaluated in Emergency Department on 09/29/2021 for the symptoms described in the history of present illness. She was evaluated in the context of the global COVID-19 pandemic, which necessitated consideration that the patient might be at risk for infection with the SARS-CoV-2 virus that causes COVID-19. Institutional protocols and algorithms that pertain to the evaluation of patients at risk for COVID-19 are in a state of rapid change based on information released by regulatory bodies including the CDC and federal and state organizations. These policies and algorithms were followed during the patient's care in the ED.    As part of my medical decision making, I reviewed the following data within the electronic MEDICAL RECORD NUMBER Nursing notes reviewed and incorporated, Old chart reviewed, Radiograph reviewed , Notes from prior ED visits, and [place] Controlled Substance  Database  ____________________________________________   FINAL CLINICAL IMPRESSION(S) / ED DIAGNOSES  Final diagnoses:  Community acquired pneumonia of left lower lobe of lung      NEW MEDICATIONS STARTED DURING THIS VISIT:  New Prescriptions   AMOXICILLIN (AMOXIL) 875 MG TABLET    Take 1 tablet (875 mg total) by mouth 2 (two) times daily.   AZITHROMYCIN (ZITHROMAX Z-PAK) 250 MG TABLET    2 pills today then 1 pill a day for 4 days   CHLORPHENIRAMINE-HYDROCODONE (TUSSIONEX PENNKINETIC ER) 10-8 MG/5ML SUER    Take 5 mLs by mouth every 12 (twelve) hours as needed for cough.     Note:  This document was prepared using Dragon voice recognition software and may include unintentional dictation errors.    Faythe Ghee, PA-C 09/29/21 1627    Willy Eddy, MD 09/29/21 [phone]

## 2021-09-29 NOTE — ED Triage Notes (Signed)
Pt reports couple of weeks ago has sinus pressure and pain and now has a cough that is sometimes productive. Pt also reports urinary urgency, frequency and dysuria.

## 2021-09-29 NOTE — Discharge Instructions (Signed)
Follow-up with your regular doctor if not improving to 3 days.  Return emergency department worsening.  Use medication as prescribed.

## 2021-10-02 ENCOUNTER — Telehealth (INDEPENDENT_AMBULATORY_CARE_PROVIDER_SITE_OTHER): Payer: Medicaid Other | Admitting: Family Medicine

## 2021-10-02 DIAGNOSIS — J4540 Moderate persistent asthma, uncomplicated: Secondary | ICD-10-CM | POA: Diagnosis not present

## 2021-10-02 DIAGNOSIS — Z09 Encounter for follow-up examination after completed treatment for conditions other than malignant neoplasm: Secondary | ICD-10-CM

## 2021-10-02 DIAGNOSIS — J309 Allergic rhinitis, unspecified: Secondary | ICD-10-CM

## 2021-10-02 DIAGNOSIS — J189 Pneumonia, unspecified organism: Secondary | ICD-10-CM | POA: Diagnosis not present

## 2021-10-02 DIAGNOSIS — J209 Acute bronchitis, unspecified: Secondary | ICD-10-CM | POA: Diagnosis not present

## 2021-10-02 MED ORDER — DOXYCYCLINE HYCLATE 100 MG PO TABS: 100.0000 mg | ORAL_TABLET | Freq: Two times a day (BID) | ORAL | 0 refills | Status: AC

## 2021-10-02 MED ORDER — ALBUTEROL SULFATE HFA 108 (90 BASE) MCG/ACT IN AERS: 2.0000 | INHALATION_SPRAY | RESPIRATORY_TRACT | 0 refills | Status: DC | PRN

## 2021-10-02 MED ORDER — PREDNISONE 20 MG PO TABS: 20.0000 mg | ORAL_TABLET | Freq: Every day | ORAL | 0 refills | Status: AC

## 2021-10-02 NOTE — Progress Notes (Signed)
Name: Lisa House   MRN: [id number]    DOB: [birth year]   Date:10/02/2021       Progress Note  Subjective:    Chief Complaint  Chief Complaint  Patient presents with   Hospitalization Follow-up    Pt states was diagnosed with Pneumonia. Pt did not get her amoxicillin is requesting to get some.     I connected with  Lisa House  on 10/02/21 at  1:20 PM EST by a video enabled telemedicine application and verified that I am speaking with the correct person using two identifiers.  I discussed the limitations of evaluation and management by telemedicine and the availability of in person appointments. The patient expressed understanding and agreed to proceed. Staff also discussed with the patient that there may be a patient responsible charge related to this service. Patient Location: car at her home Provider Location:  [facility] clinic Additional Individuals present: none  HPI Pt presents for f/up from ER visit 3 days ago, was diagnosed with CAP and imaging suggestive of bronchitis - treated with zpak, amoxicillin and cough syrup. She did not end of getting the amoxicillin. Some sx better, but yesterday she still did not feel great.  Hx of bronchitis dx at least once yearly over the past couple years No current sob, resp distress, no fever, sweats, still coughing     Patient Active Problem List   Diagnosis Date Noted   Lumbar radiculopathy 04/27/2020   Neck pain 02/14/2020   Right ovarian cyst 08/16/2019   Family history of congenital aortic stenosis 09/23/2017   Herpes genitalis 02/08/2017   IBS (irritable bowel syndrome) 05/09/2015   Morbid obesity (HCC) 05/09/2015   OCD (obsessive compulsive disorder) 05/09/2015   Bilateral polycystic ovarian syndrome 05/09/2015   Psoriasis 05/09/2015   History of depression 05/09/2015   Allergic rhinitis 05/02/2010    Social History   Tobacco Use   Smoking status: Every Day    Types: Cigarettes   Smokeless tobacco: Never   Substance Use Topics   Alcohol use: No    Alcohol/week: 0.0 standard drinks     Current Outpatient Medications:    albuterol (PROVENTIL HFA) 108 (90 Base) MCG/ACT inhaler, Inhale 2 puffs into the lungs every 4 (four) hours as needed for wheezing or shortness of breath., Disp: 1 each, Rfl: 0   amoxicillin (AMOXIL) 875 MG tablet, Take 1 tablet (875 mg total) by mouth 2 (two) times daily., Disp: 20 tablet, Rfl: 0   azithromycin (ZITHROMAX Z-PAK) 250 MG tablet, 2 pills today then 1 pill a day for 4 days, Disp: 6 each, Rfl: 0   chlorpheniramine-HYDROcodone (TUSSIONEX PENNKINETIC ER) 10-8 MG/5ML SUER, Take 5 mLs by mouth every 12 (twelve) hours as needed for cough., Disp: 150 mL, Rfl: 0   fexofenadine-pseudoephedrine (ALLEGRA-D) 60-120 MG 12 hr tablet, Take 1 tablet by mouth 2 (two) times daily., Disp: 20 tablet, Rfl: 0   omeprazole (PRILOSEC) 40 MG capsule, Take 1 capsule (40 mg total) by mouth daily., Disp: 90 capsule, Rfl: 0   ondansetron (ZOFRAN ODT) 4 MG disintegrating tablet, Take 1 tablet (4 mg total) by mouth every 8 (eight) hours as needed for nausea or vomiting., Disp: 20 tablet, Rfl: 0   pimecrolimus (ELIDEL) 1 % cream, APPLY EXTERNALLY TO THE AFFECTED AREA TWICE DAILY, Disp: 100 g, Rfl: 0   propranolol (INDERAL) 20 MG tablet, Take 1 tablet (20 mg total) by mouth 3 (three) times daily as needed., Disp: 30 tablet, Rfl: 0   triamcinolone  cream (KENALOG) 0.1 %, Apply topically 2 (two) times daily., Disp: 453.6 g, Rfl: 0   valACYclovir (VALTREX) 500 MG tablet, Take 1 tablet (500 mg total) by mouth 2 (two) times daily as needed. Prn outbreak x 3 days, Disp: 30 tablet, Rfl: 1   levonorgestrel (MIRENA, 52 MG,) 20 MCG/24HR IUD, 1 Intra Uterine Device (1 each total) by Intrauterine route once for 1 dose., Disp: 1 each, Rfl: 0  No Known Allergies  I personally reviewed active problem list, medication list, allergies, family history, social history, health maintenance, notes from last encounter,  lab results, imaging with the patient/caregiver today.   Review of Systems  Constitutional: Negative.   HENT: Negative.    Eyes: Negative.   Respiratory: Negative.    Cardiovascular: Negative.   Gastrointestinal: Negative.   Endocrine: Negative.   Genitourinary: Negative.   Musculoskeletal: Negative.   Skin: Negative.   Allergic/Immunologic: Negative.   Neurological: Negative.   Hematological: Negative.   Psychiatric/Behavioral: Negative.    All other systems reviewed and are negative.    Objective:   Virtual encounter, vitals limited, only able to obtain the following There were no vitals filed for this visit. There is no height or weight on file to calculate BMI. Nursing Note and Vital Signs reviewed.  Physical Exam Vitals and nursing note reviewed.  Constitutional:      General: She is not in acute distress.    Appearance: Normal appearance. She is not ill-appearing, toxic-appearing or diaphoretic.  Pulmonary:     Effort: Pulmonary effort is normal. No respiratory distress.     Comments: No retractions, no accessory muscle use, no tachypnea, no audible wheeze or stridor, no coughing during encounter Neurological:     Mental Status: She is alert.    PE limited by virtual encounter  No results found for this or any previous visit (from the past 72 hour(s)).  Assessment and Plan:     ICD-10-CM   1. Community acquired pneumonia of left lung, unspecified part of lung  J18.9 doxycycline (VIBRA-TABS) 100 MG tablet    2. Acute bronchitis, unspecified organism  J20.9 albuterol (PROVENTIL HFA) 108 (90 Base) MCG/ACT inhaler    predniSONE (DELTASONE) 20 MG tablet    3. Asthma, moderate persistent, poorly-controlled  J45.40    pt did not give me this history when asked, refill on inhaler, suspect some of her sx will improve with steroids and inhaler    4. Morbid obesity (HCC)  E66.01    additional risk factor - pt encouraged to seek f/up with any worsening CP or SOB     5. Allergic rhinitis, unspecified seasonality, unspecified trigger  J30.9    tx underlying allergies    6. Encounter for examination following treatment at hospital  Z09    ER records, imaging results etc personally reviewed today and considered in MDM    Pt dx with CAP in ED 4 d ago, unfortunately amoxicillin did not get to pharmacy (printed) and pt only got Zpak.  She feels a little better, does not seem to be doing worse.  She is well appearing on video, no coughing, no distress, hx of bronchitis.  Since some improvement and pt only has one day left of Zpak - will have her complete that, add on tx for bronchitis with hx and current sx (feels wheezy), can start doxy course if any worsening over the next couple days.    Plan sent to pt with AVS:  Finish the Zpak for now. Start taking  the prednisone daily for 5 days and use the inhaler as needed for coughing, wheezing, shortness of breath, and tightness in chest. Continue to use cough meds as needed I recommend using mucinex twice a day, resting, using ample fluids Tylenol or ibuprofen (and the steroids) should start to help with the main in your lungs  If you start to feel worse at all in the next few days or week, THEN I recommend also starting the doxycycline antibiotic course.  Symptoms include any worsening shortness of breath, fatigue, coughing.  See info below on pneumonia and bronchitis - you had both   I expect the worst symptoms to keep improving over the next 2-3 days, and gradual improvement of coughing and fatigue sometimes over a week or two.   -Red flags and when to present for emergency care or RTC including fever >101.5F, chest pain, shortness of breath, new/worsening/un-resolving symptoms, reviewed with patient at time of visit. Follow up and care instructions discussed and provided in AVS. - I discussed the assessment and treatment plan with the patient. The patient was provided an opportunity to ask questions and all  were answered. The patient agreed with the plan and demonstrated an understanding of the instructions.  I provided 20+ minutes of non-face-to-face time during this encounter.  Danelle Berry, PA-C 10/02/21 1:33 PM

## 2021-10-02 NOTE — Patient Instructions (Addendum)
Finish the Zpak for now. Start taking the prednisone daily for 5 days and use the inhaler as needed for coughing, wheezing, shortness of breath, and tightness in chest. Continue to use cough meds as needed I recommend using mucinex twice a day, resting, using ample fluids Tylenol or ibuprofen (and the steroids) should start to help with the main in your lungs  If you start to feel worse at all in the next few days or week, THEN I recommend also starting the doxycycline antibiotic course.  Symptoms include any worsening shortness of breath, fatigue, coughing.  See info below on pneumonia and bronchitis - you had both   I expect the worst symptoms to keep improving over the next 2-3 days, and gradual improvement of coughing and fatigue sometimes over a week or two.  Community-Acquired Pneumonia, Adult Pneumonia is an infection of the lungs. It causes irritation and swelling in the airways of the lungs. Mucus and fluid may also build up inside the airways. This may cause coughing and trouble breathing. One type of pneumonia can happen while you are in a hospital. A different type can happen when you are not in a hospital (community-acquired pneumonia). What are the causes? This condition is caused by germs (viruses, bacteria, or fungi). Some types of germs can spread from person to person. Pneumonia is not thought to spread from person to person. What increases the risk? You are more likely to develop this condition if: You have a long-term (chronic) disease, such as: Disease of the lungs. This may be chronic obstructive pulmonary disease (COPD) or asthma. Heart failure. Cystic fibrosis. Diabetes. Kidney disease. Sickle cell disease. HIV. You have other health problems, such as: Your body's defense system (immune system) is weak. A condition that may cause you to breathe in fluids from your mouth and nose. You had your spleen taken out. You do not take good care of your teeth and mouth  (poor dental hygiene). You use or have used tobacco products. You travel where the germs that cause this illness are common. You are near certain animals or the places they live. You are older than 30 years of age. What are the signs or symptoms? Symptoms of this condition include: A cough. A fever. Sweating or chills. Chest pain, often when you breathe deeply or cough. Breathing problems, such as: Fast breathing. Trouble breathing. Shortness of breath. Feeling tired (fatigued). Muscle aches. How is this treated? Treatment for this condition depends on many things, such as: The cause of your illness. Your medicines. Your other health problems. Most adults can be treated at home. Sometimes, treatment must happen in a hospital. Treatment may include medicines to kill germs. Medicines may depend on which germ caused your illness. Very bad pneumonia is rare. If you get it, you may: Have a machine to help you breathe. Have fluid taken away from around your lungs. Follow these instructions at home: Medicines Take over-the-counter and prescription medicines only as told by your doctor. Take cough medicine only if you are losing sleep. Cough medicine can keep your body from taking mucus away from your lungs. If you were prescribed an antibiotic medicine, take it as told by your doctor. Do not stop taking the antibiotic even if you start to feel better. Lifestyle   Do not drink alcohol. Do not use any products that contain nicotine or tobacco, such as cigarettes, e-cigarettes, and chewing tobacco. If you need help quitting, ask your doctor. Eat a healthy diet. This includes a lot  of vegetables, fruits, whole grains, low-fat dairy products, and low-fat (lean) protein. General instructions  Rest a lot. Sleep for at least 8 hours each night. Sleep with your head and neck raised. Put a few pillows under your head or sleep in a reclining chair. Return to your normal activities as told by  your doctor. Ask your doctor what activities are safe for you. Drink enough fluid to keep your pee (urine) pale yellow. If your throat is sore, rinse your mouth often with salt water. To make salt water, dissolve -1 tsp (3-6 g) of salt in 1 cup (237 mL) of warm water. Keep all follow-up visits as told by your doctor. This is important. How is this prevented? You can lower your risk of pneumonia by: Getting the pneumonia shot (vaccine). These shots have different types and schedules. Ask your doctor what works best for you. Think about getting this shot if: You are older than 30 years of age. You are 53-51 years of age and: You are being treated for cancer. You have long-term lung disease. You have other problems that affect your body's defense system. Ask your doctor if you have one of these. Getting your flu shot every year. Ask your doctor which type of shot is best for you. Going to the dentist as often as told. Washing your hands often with soap and water for at least 20 seconds. If you cannot use soap and water, use hand sanitizer. Contact a doctor if: You have a fever. You lose sleep because your cough medicine does not help. Get help right away if: You are short of breath and this gets worse. You have more chest pain. Your sickness gets worse. This is very serious if: You are an older adult. Your body's defense system is weak. You cough up blood. These symptoms may be an emergency. Do not wait to see if the symptoms will go away. Get medical help right away. Call your local emergency services (911 in the [place] Do not drive yourself to the hospital. Summary Pneumonia is an infection of the lungs. Community-acquired pneumonia affects people who have not been in the hospital. Certain germs can cause this infection. This condition may be treated with medicines that kill germs. For very bad pneumonia, you may need a hospital stay and treatment to help with breathing. This  information is not intended to replace advice given to you by your health care provider. Make sure you discuss any questions you have with your health care provider. Document Revised: 08/09/2019 Document Reviewed: 08/09/2019 Elsevier Patient Education  2022 [employer]  Acute Bronchitis, Adult Acute bronchitis is when air tubes in the lungs (bronchi) suddenly get swollen. The condition can make it hard for you to breathe. In adults, acute bronchitis usually goes away within 2 weeks. A cough caused by bronchitis may last up to 3 weeks. Smoking, allergies, and asthma can make the condition worse. What are the causes? Germs that cause cold and flu (viruses). The most common cause of this condition is the virus that causes the common cold. Bacteria. Substances that bother (irritate) the lungs, including: Smoke from cigarettes and other types of tobacco. Dust and pollen. Fumes from chemicals, gases, or burned fuel. Indoor or outdoor air pollution. What increases the risk? A weak body's defense system. This is also called the immune system. Any condition that affects your lungs and breathing, such as asthma. What are the signs or symptoms? A cough. Coughing up clear, yellow, or green mucus. Making  high-pitched whistling sounds when you breathe, most often when you breathe out (wheezing). Runny or stuffy nose. Having too much mucus in your lungs (chest congestion). Shortness of breath. Body aches. A sore throat. How is this treated? Acute bronchitis may go away over time without treatment. Your doctor may tell you to: Drink more fluids. This will help thin your mucus so it is easier to cough up. Use a device that gets medicine into your lungs (inhaler). Use a vaporizer or a humidifier. These are machines that add water to the air. This helps with coughing and poor breathing. Take a medicine that thins mucus and helps clear it from your lungs. Take a medicine that prevents or stops  coughing. It is not common to take an antibiotic medicine for this condition. Follow these instructions at home:  Take over-the-counter and prescription medicines only as told by your doctor. Use an inhaler, vaporizer, or humidifier as told by your doctor. Take two teaspoons (10 mL) of honey at bedtime. This helps lessen your coughing at night. Drink enough fluid to keep your pee (urine) pale yellow. Do not smoke or use any products that contain nicotine or tobacco. If you need help quitting, ask your doctor. Get a lot of rest. Return to your normal activities when your doctor says that it is safe. Keep all follow-up visits. How is this prevented?  Wash your hands often with soap and water for at least 20 seconds. If you cannot use soap and water, use hand sanitizer. Avoid contact with people who have cold symptoms. Try not to touch your mouth, nose, or eyes with your hands. Avoid breathing in smoke or chemical fumes. Make sure to get the flu shot every year. Contact a doctor if: Your symptoms do not get better in 2 weeks. You have trouble coughing up the mucus. Your cough keeps you awake at night. You have a fever. Get help right away if: You cough up blood. You have chest pain. You have very bad shortness of breath. You faint or keep feeling like you are going to faint. You have a very bad headache. Your fever or chills get worse. These symptoms may be an emergency. Get help right away. Call your local emergency services (911 in the [place] Do not wait to see if the symptoms will go away. Do not drive yourself to the hospital. Summary Acute bronchitis is when air tubes in the lungs (bronchi) suddenly get swollen. In adults, acute bronchitis usually goes away within 2 weeks. Drink more fluids. This will help thin your mucus so it is easier to cough up. Take over-the-counter and prescription medicines only as told by your doctor. Contact a doctor if your symptoms do not improve  after 2 weeks of treatment. This information is not intended to replace advice given to you by your health care provider. Make sure you discuss any questions you have with your health care provider. Document Revised: 02/27/2021 Document Reviewed: 02/27/2021 Elsevier Patient Education  2022 [employer].

## 2021-10-10 ENCOUNTER — Encounter: Payer: Self-pay | Admitting: Emergency Medicine

## 2021-10-10 ENCOUNTER — Emergency Department
Admission: EM | Admit: 2021-10-10 | Discharge: 2021-10-10 | Disposition: A | Discharge status: Home / Self Care | Payer: Medicaid Other | Attending: Emergency Medicine | Admitting: Emergency Medicine

## 2021-10-10 ENCOUNTER — Other Ambulatory Visit: Payer: Self-pay

## 2021-10-10 ENCOUNTER — Emergency Department: Payer: Medicaid Other

## 2021-10-10 DIAGNOSIS — F17210 Nicotine dependence, cigarettes, uncomplicated: Secondary | ICD-10-CM | POA: Diagnosis not present

## 2021-10-10 DIAGNOSIS — R079 Chest pain, unspecified: Secondary | ICD-10-CM | POA: Diagnosis not present

## 2021-10-10 DIAGNOSIS — R42 Dizziness and giddiness: Secondary | ICD-10-CM | POA: Diagnosis present

## 2021-10-10 DIAGNOSIS — E860 Dehydration: Secondary | ICD-10-CM | POA: Diagnosis not present

## 2021-10-10 LAB — BASIC METABOLIC PANEL
Anion gap: 5 (ref 5–15)
BUN: 16 mg/dL (ref 6–20)
CO2: 26 mmol/L (ref 22–32)
Calcium: 8.7 mg/dL — ABNORMAL LOW (ref 8.9–10.3)
Chloride: 100 mmol/L (ref 98–111)
Creatinine, Ser: 0.71 mg/dL (ref 0.44–1.00)
GFR, Estimated: >60 mL/min (ref >60)
Glucose, Bld: 97 mg/dL (ref 70–99)
Potassium: 3.7 mmol/L (ref 3.5–5.1)
Sodium: 131 mmol/L — ABNORMAL LOW (ref 135–145)

## 2021-10-10 LAB — HEPATIC FUNCTION PANEL
ALT: 25 U/L (ref 0–44)
AST: 17 U/L (ref 15–41)
Albumin: 3.7 g/dL (ref 3.5–5.0)
Alkaline Phosphatase: 76 U/L (ref 38–126)
Bilirubin, Direct: <0.1 mg/dL (ref 0.0–0.2)
Total Bilirubin: 0.4 mg/dL (ref 0.3–1.2)
Total Protein: 7.7 g/dL (ref 6.5–8.1)

## 2021-10-10 LAB — URINALYSIS, ROUTINE W REFLEX MICROSCOPIC
Bilirubin Urine: NEGATIVE
Glucose, UA: NEGATIVE mg/dL
Ketones, ur: NEGATIVE mg/dL
Leukocytes,Ua: NEGATIVE
Nitrite: NEGATIVE
Protein, ur: NEGATIVE mg/dL
Specific Gravity, Urine: 1.025 (ref 1.005–1.030)
pH: 7 (ref 5.0–8.0)

## 2021-10-10 LAB — PREGNANCY, URINE: Preg Test, Ur: NEGATIVE

## 2021-10-10 LAB — URINALYSIS, MICROSCOPIC (REFLEX)

## 2021-10-10 LAB — CBC
HCT: 40 % (ref 36.0–46.0)
Hemoglobin: 13 g/dL (ref 12.0–15.0)
MCH: 27.4 pg (ref 26.0–34.0)
MCHC: 32.5 g/dL (ref 30.0–36.0)
MCV: 84.2 fL (ref 80.0–100.0)
Platelets: 273 10*3/uL (ref 150–400)
RBC: 4.75 MIL/uL (ref 3.87–5.11)
RDW: 13.1 % (ref 11.5–15.5)
WBC: 9 10*3/uL (ref 4.0–10.5)
nRBC: 0 % (ref 0.0–0.2)

## 2021-10-10 MED ORDER — SODIUM CHLORIDE 0.9 % IV BOLUS: 1000.0000 mL | Freq: Once | INTRAVENOUS | Status: DC

## 2021-10-10 MED ORDER — ONDANSETRON HCL 4 MG PO TABS
4.0000 mg | ORAL_TABLET | Freq: Three times a day (TID) | ORAL | 0 refills | Status: DC | PRN
Start: 2021-10-10 — End: 2024-07-12

## 2021-10-10 NOTE — ED Notes (Signed)
Patient transported to X-ray 

## 2021-10-10 NOTE — Discharge Instructions (Signed)
Your liver tests are normal.  I have sent a prescription for nausea medication to you pharmacy.  Be sure to stay hydrated and drink plenty of fluids.  Return for any additional questions or concerns.

## 2021-10-10 NOTE — ED Notes (Signed)
Pt states she had pneumonia a few weeks ago, but only took one of her two prescriptions. Pt states she feels weak and very tired especially at work. Pt in NAD at this time.

## 2021-10-10 NOTE — ED Provider Notes (Signed)
[facility] Emergency Department Provider Note    Event Date/Time   First MD Initiated Contact with Patient 10/10/21 1232     (approximate)  I have reviewed the triage vital signs and the nursing notes.   HISTORY  Chief Complaint Weakness and Dizziness    HPI Lisa House is a 30 y.o. female presents to the ER for evaluation of some left-sided chest pain mild cough dizziness and fatigue.  Patient was recently treated for community-acquired pneumonia was given course of Z-Pak with improvement in her cough symptoms not having any chills.  She primarily concerned because she realized that she had taken a few extra doses of Tylenol over the past week.  Denies any right upper quadrant pain.  No pain with deep inspiration.  Past Medical History:  Diagnosis Date   Allergy    Depression    Eczema    GERD (gastroesophageal reflux disease)    Intertrigo    Obesity    OCD (obsessive compulsive disorder)    Panic attack    Family History  Problem Relation Age of Onset   Hypertension Mother    Diabetes Mother    Headache Mother    Stroke Mother    Hypertension Father    Lung cancer Father    Asthma Father    Heart disease Son        Aortic Valve Disorder Stenosis-had balloon valvuloplasty, on meds   Ovarian cancer Maternal Grandmother    Colon cancer Maternal Grandfather    Cleft palate Son    Past Surgical History:  Procedure Laterality Date   CHOLECYSTECTOMY  01/09/2011   Dr. Excell Seltzer   Patient Active Problem List   Diagnosis Date Noted   Lumbar radiculopathy 04/27/2020   Neck pain 02/14/2020   Right ovarian cyst 08/16/2019   Family history of congenital aortic stenosis 09/23/2017   Herpes genitalis 02/08/2017   IBS (irritable bowel syndrome) 05/09/2015   Morbid obesity (HCC) 05/09/2015   OCD (obsessive compulsive disorder) 05/09/2015   Bilateral polycystic ovarian syndrome 05/09/2015   Psoriasis 05/09/2015   History of depression  05/09/2015   Allergic rhinitis 05/02/2010      Prior to Admission medications   Medication Sig Start Date End Date Taking? Authorizing Provider  ondansetron (ZOFRAN) 4 MG tablet Take 1 tablet (4 mg total) by mouth every 8 (eight) hours as needed for nausea or vomiting. 10/10/21  Yes Willy Eddy, MD  albuterol (PROVENTIL HFA) 108 (90 Base) MCG/ACT inhaler Inhale 2 puffs into the lungs every 4 (four) hours as needed for wheezing or shortness of breath. 10/02/21   Danelle Berry, PA-C  azithromycin (ZITHROMAX Z-PAK) 250 MG tablet 2 pills today then 1 pill a day for 4 days 09/29/21   Sherrie Mustache Roselyn Bering, PA-C  chlorpheniramine-HYDROcodone [facility] PENNKINETIC ER) 10-8 MG/5ML SUER Take 5 mLs by mouth every 12 (twelve) hours as needed for cough. 09/29/21   Fisher, Roselyn Bering, PA-C  doxycycline (VIBRA-TABS) 100 MG tablet Take 1 tablet (100 mg total) by mouth 2 (two) times daily for 7 days. 10/03/21 10/10/21  Danelle Berry, PA-C  fexofenadine-pseudoephedrine (ALLEGRA-D) 60-120 MG 12 hr tablet Take 1 tablet by mouth 2 (two) times daily. 08/24/21   Joni Reining, PA-C  levonorgestrel (MIRENA, 52 MG,) 20 MCG/24HR IUD 1 Intra Uterine Device (1 each total) by Intrauterine route once for 1 dose. 06/15/18 06/15/18  [name], MD  omeprazole (PRILOSEC) 40 MG capsule Take 1 capsule (40 mg total) by mouth daily. 04/09/21  [name], MD  ondansetron (ZOFRAN ODT) 4 MG disintegrating tablet Take 1 tablet (4 mg total) by mouth every 8 (eight) hours as needed for nausea or vomiting. 06/13/21   Gabriel Cirri, NP  pimecrolimus (ELIDEL) 1 % cream APPLY EXTERNALLY TO THE AFFECTED AREA TWICE DAILY 04/09/21   [name], MD  propranolol (INDERAL) 20 MG tablet Take 1 tablet (20 mg total) by mouth 3 (three) times daily as needed. 07/17/21   Sharman Cheek, MD  triamcinolone cream (KENALOG) 0.1 % Apply topically 2 (two) times daily. 04/09/21   [name], MD  valACYclovir (VALTREX) 500 MG tablet Take 1 tablet (500  mg total) by mouth 2 (two) times daily as needed. Prn outbreak x 3 days 04/09/21   [name], MD    Allergies Patient has no known allergies.    Social History Social History   Tobacco Use   Smoking status: Every Day    Types: Cigarettes   Smokeless tobacco: Never  Vaping Use   Vaping Use: Never used  Substance Use Topics   Alcohol use: No    Alcohol/week: 0.0 standard drinks   Drug use: No    Review of Systems Patient denies headaches, rhinorrhea, blurry vision, numbness, shortness of breath, chest pain, edema, cough, abdominal pain, nausea, vomiting, diarrhea, dysuria, fevers, rashes or hallucinations unless otherwise stated above in HPI. ____________________________________________   PHYSICAL EXAM:  VITAL SIGNS: Vitals:   10/10/21 1254 10/10/21 1405  BP: 109/66 110/62  Pulse: 77 61  Resp: 18 16  Temp: 98.3 F (36.8 C)   SpO2: 99% 98%    Constitutional: Alert and oriented.  Eyes: Conjunctivae are normal.  Head: Atraumatic. Nose: No congestion/rhinnorhea. Mouth/Throat: Mucous membranes are moist.   Neck: No stridor. Painless ROM.  Cardiovascular: Normal rate, regular rhythm. Grossly normal heart sounds.  Good peripheral circulation. Respiratory: Normal respiratory effort.  No retractions. Lungs CTAB. Gastrointestinal: Soft and nontender. No distention. No abdominal bruits. No CVA tenderness. Genitourinary:  Musculoskeletal: No lower extremity tenderness nor edema.  No joint effusions. Neurologic:  Normal speech and language. No gross focal neurologic deficits are appreciated. No facial droop Skin:  Skin is warm, dry and intact. No rash noted. Psychiatric: Mood and affect are normal. Speech and behavior are normal.  ____________________________________________   LABS (all labs ordered are listed, but only abnormal results are displayed)  Results for orders placed or performed during the hospital encounter of 10/10/21 (from the past 24 hour(s))  Basic  metabolic panel     Status: Abnormal   Collection Time: 10/10/21 11:34 AM  Result Value Ref Range   Sodium 131 (L) 135 - 145 mmol/L   Potassium 3.7 3.5 - 5.1 mmol/L   Chloride 100 98 - 111 mmol/L   CO2 26 22 - 32 mmol/L   Glucose, Bld 97 70 - 99 mg/dL   BUN 16 6 - 20 mg/dL   Creatinine, Ser 0.93 0.44 - 1.00 mg/dL   Calcium 8.7 (L) 8.9 - 10.3 mg/dL   GFR, Estimated >26 >71 mL/min   Anion gap 5 5 - 15  CBC     Status: None   Collection Time: 10/10/21 11:34 AM  Result Value Ref Range   WBC 9.0 4.0 - 10.5 K/uL   RBC 4.75 3.87 - 5.11 MIL/uL   Hemoglobin 13.0 12.0 - 15.0 g/dL   HCT 24.5 80.9 - 98.3 %   MCV 84.2 80.0 - 100.0 fL   MCH 27.4 26.0 - 34.0 pg   MCHC 32.5 30.0 -  36.0 g/dL   RDW 16.1 09.6 - 04.5 %   Platelets 273 150 - 400 K/uL   nRBC 0.0 0.0 - 0.2 %  Urinalysis, Routine w reflex microscopic Urine, Clean Catch     Status: Abnormal   Collection Time: 10/10/21 11:34 AM  Result Value Ref Range   Color, Urine YELLOW YELLOW   APPearance CLEAR CLEAR   Specific Gravity, Urine 1.025 1.005 - 1.030   pH 7.0 5.0 - 8.0   Glucose, UA NEGATIVE NEGATIVE mg/dL   Hgb urine dipstick MODERATE (A) NEGATIVE   Bilirubin Urine NEGATIVE NEGATIVE   Ketones, ur NEGATIVE NEGATIVE mg/dL   Protein, ur NEGATIVE NEGATIVE mg/dL   Nitrite NEGATIVE NEGATIVE   Leukocytes,Ua NEGATIVE NEGATIVE  Pregnancy, urine     Status: None   Collection Time: 10/10/21 11:34 AM  Result Value Ref Range   Preg Test, Ur NEGATIVE NEGATIVE  Urinalysis, Microscopic (reflex)     Status: Abnormal   Collection Time: 10/10/21 11:34 AM  Result Value Ref Range   RBC / HPF 0-5 0 - 5 RBC/hpf   WBC, UA 0-5 0 - 5 WBC/hpf   Bacteria, UA RARE (A) NONE SEEN   Squamous Epithelial / LPF 6-10 0 - 5  Hepatic function panel     Status: None   Collection Time: 10/10/21 11:34 AM  Result Value Ref Range   Total Protein 7.7 6.5 - 8.1 g/dL   Albumin 3.7 3.5 - 5.0 g/dL   AST 17 15 - 41 U/L   ALT 25 0 - 44 U/L   Alkaline Phosphatase 76  38 - 126 U/L   Total Bilirubin 0.4 0.3 - 1.2 mg/dL   Bilirubin, Direct <4.0 0.0 - 0.2 mg/dL   Indirect Bilirubin NOT CALCULATED 0.3 - 0.9 mg/dL   ____________________________________________  EKG My review and personal interpretation at Time: 11:34   Indication: dizziness  Rate: 60  Rhythm: sinus Axis: normal Other: normal intervals, no stemi ____________________________________________  RADIOLOGY  I personally reviewed all radiographic images ordered to evaluate for the above acute complaints and reviewed radiology reports and findings.  These findings were personally discussed with the patient.  Please see medical record for radiology report.  ____________________________________________   PROCEDURES  Procedure(s) performed:  Procedures    Critical Care performed: no ____________________________________________   INITIAL IMPRESSION / ASSESSMENT AND PLAN / ED COURSE  Pertinent labs & imaging results that were available during my care of the patient were reviewed by me and considered in my medical decision making (see chart for details).   DDX: Pleurisy, bronchitis, asthma, pneumonia, PE, CHF, ACS, electrolyte abnormality, dehydration, Tylenol overdose  Lisa House is a 30 y.o. who presents to the ED with presentation as described above.  Patient nontoxic-appearing in no acute distress.  Recommended IV fluids her sodium was mildly low patient declining this stating that she prefer to treat take p.o.  Her LFTs are normal.  By history does not sound like she was actually consuming enough to have a toxic ingestion.  Work-up is reassuring at this point.  Does not appear consistent with ACS CHF or PE.  Infiltrates on chest x-ray have resolved.  Likely some mild bronchitis for which she has albuterol at home.  Possible pleurisy.  Does appear stable and appropriate for outpatient follow-up.     The patient was evaluated in Emergency Department today for the symptoms described  in the history of present illness. He/she was evaluated in the context of the global COVID-19 pandemic, which  necessitated consideration that the patient might be at risk for infection with the SARS-CoV-2 virus that causes COVID-19. Institutional protocols and algorithms that pertain to the evaluation of patients at risk for COVID-19 are in a state of rapid change based on information released by regulatory bodies including the CDC and federal and state organizations. These policies and algorithms were followed during the patient's care in the ED.  As part of my medical decision making, I reviewed the following data within the electronic MEDICAL RECORD NUMBER Nursing notes reviewed and incorporated, Labs reviewed, notes from prior ED visits and [place] Controlled Substance Database   ____________________________________________   FINAL CLINICAL IMPRESSION(S) / ED DIAGNOSES  Final diagnoses:  Dehydration      NEW MEDICATIONS STARTED DURING THIS VISIT:  Discharge Medication List as of 10/10/2021  2:03 PM     START taking these medications   Details  ondansetron (ZOFRAN) 4 MG tablet Take 1 tablet (4 mg total) by mouth every 8 (eight) hours as needed for nausea or vomiting., Starting Thu 10/10/2021, Normal         Note:  This document was prepared using Dragon voice recognition software and may include unintentional dictation errors.    Willy Eddy, MD 10/10/21 [phone]

## 2021-10-10 NOTE — ED Triage Notes (Signed)
Pt comes into the ED via POV c/o weakness and dizziness.  Pt states she was dx with pneumonia last week and she is having increased pain on the left side as well as worsening weakness and dizziness.  Pt currently ambulatory to triage with even and unlabored respirations.

## 2021-10-10 NOTE — ED Provider Notes (Incomplete)
[facility] Emergency Department Provider Note ____________________________________________   Event Date/Time   First MD Initiated Contact with Patient 10/10/21 1232     (approximate)  I have reviewed the triage vital signs and the nursing notes.   HISTORY  Chief Complaint Weakness and Dizziness  HPI Lisa House is a 30 y.o. female with history of *** presents to the emergency department for treatment and evaluation ***.         Past Medical History:  Diagnosis Date   Allergy    Depression    Eczema    GERD (gastroesophageal reflux disease)    Intertrigo    Obesity    OCD (obsessive compulsive disorder)    Panic attack     Patient Active Problem List   Diagnosis Date Noted   Lumbar radiculopathy 04/27/2020   Neck pain 02/14/2020   Right ovarian cyst 08/16/2019   Family history of congenital aortic stenosis 09/23/2017   Herpes genitalis 02/08/2017   IBS (irritable bowel syndrome) 05/09/2015   Morbid obesity (HCC) 05/09/2015   OCD (obsessive compulsive disorder) 05/09/2015   Bilateral polycystic ovarian syndrome 05/09/2015   Psoriasis 05/09/2015   History of depression 05/09/2015   Allergic rhinitis 05/02/2010    Past Surgical History:  Procedure Laterality Date   CHOLECYSTECTOMY  01/09/2011   Dr. Excell Seltzer    Prior to Admission medications   Medication Sig Start Date End Date Taking? Authorizing Provider  albuterol (PROVENTIL HFA) 108 (90 Base) MCG/ACT inhaler Inhale 2 puffs into the lungs every 4 (four) hours as needed for wheezing or shortness of breath. 10/02/21   Danelle Berry, PA-C  azithromycin (ZITHROMAX Z-PAK) 250 MG tablet 2 pills today then 1 pill a day for 4 days 09/29/21   Sherrie Mustache Roselyn Bering, PA-C  chlorpheniramine-HYDROcodone [facility] PENNKINETIC ER) 10-8 MG/5ML SUER Take 5 mLs by mouth every 12 (twelve) hours as needed for cough. 09/29/21   Fisher, Roselyn Bering, PA-C  doxycycline (VIBRA-TABS) 100 MG  tablet Take 1 tablet (100 mg total) by mouth 2 (two) times daily for 7 days. 10/03/21 10/10/21  Danelle Berry, PA-C  fexofenadine-pseudoephedrine (ALLEGRA-D) 60-120 MG 12 hr tablet Take 1 tablet by mouth 2 (two) times daily. 08/24/21   Joni Reining, PA-C  levonorgestrel (MIRENA, 52 MG,) 20 MCG/24HR IUD 1 Intra Uterine Device (1 each total) by Intrauterine route once for 1 dose. 06/15/18 06/15/18  [name], MD  omeprazole (PRILOSEC) 40 MG capsule Take 1 capsule (40 mg total) by mouth daily. 04/09/21   [name], MD  ondansetron (ZOFRAN ODT) 4 MG disintegrating tablet Take 1 tablet (4 mg total) by mouth every 8 (eight) hours as needed for nausea or vomiting. 06/13/21   Gabriel Cirri, NP  pimecrolimus (ELIDEL) 1 % cream APPLY EXTERNALLY TO THE AFFECTED AREA TWICE DAILY 04/09/21   [name], MD  propranolol (INDERAL) 20 MG tablet Take 1 tablet (20 mg total) by mouth 3 (three) times daily as needed. 07/17/21   Sharman Cheek, MD  triamcinolone cream (KENALOG) 0.1 % Apply topically 2 (two) times daily. 04/09/21   [name], MD  valACYclovir (VALTREX) 500 MG tablet Take 1 tablet (500 mg total) by mouth 2 (two) times daily as needed. Prn outbreak x 3 days 04/09/21   [name], MD    Allergies Patient has no known allergies.  Family History  Problem Relation Age of Onset   Hypertension Mother    Diabetes Mother    Headache Mother    Stroke Mother  Hypertension Father    Lung cancer Father    Asthma Father    Heart disease Son        Aortic Valve Disorder Stenosis-had balloon valvuloplasty, on meds   Ovarian cancer Maternal Grandmother    Colon cancer Maternal Grandfather    Cleft palate Son     Social History Social History   Tobacco Use   Smoking status: Every Day    Types: Cigarettes   Smokeless tobacco: Never  Vaping Use   Vaping Use: Never used  Substance Use Topics   Alcohol use: No    Alcohol/week: 0.0 standard drinks   Drug use: No     Review of Systems  Constitutional: No fever/chills Eyes: No visual changes. ENT: No sore throat. Cardiovascular: Denies chest pain. Respiratory: Denies shortness of breath. Gastrointestinal: No abdominal pain.  No nausea, no vomiting.  No diarrhea.  No constipation. Genitourinary: Negative for dysuria. Musculoskeletal: Negative for back pain. Skin: Negative for rash. Neurological: Negative for headaches, focal weakness or numbness. {**Psychiatric:  Endocrine:  Hematological/Lymphatic:  Allergic/Immunilogical: **}  ____________________________________________   PHYSICAL EXAM:  VITAL SIGNS: ED Triage Vitals  Enc Vitals Group     BP 10/10/21 1138 124/79     Pulse Rate 10/10/21 1138 64     Resp 10/10/21 1138 17     Temp 10/10/21 1138 97.7 F (36.5 C)     Temp Source 10/10/21 1138 Oral     SpO2 10/10/21 1138 100 %     Weight 10/10/21 1135 220 lb (99.8 kg)     Height 10/10/21 1135 5\' 6"  (1.676 m)     Head Circumference --      Peak Flow --      Pain Score 10/10/21 1135 4     Pain Loc --      Pain Edu? --      Excl. in GC? --     Constitutional: Alert and oriented. Well appearing and in no acute distress. Eyes: Conjunctivae are normal. PERRL. EOMI. Head: Atraumatic. Nose: No congestion/rhinnorhea. Mouth/Throat: Mucous membranes are moist.  Oropharynx non-erythematous. Neck: No stridor.   Hematological/Lymphatic/Immunilogical: No cervical lymphadenopathy. Cardiovascular: Normal rate, regular rhythm. Grossly normal heart sounds.  Good peripheral circulation. Respiratory: Normal respiratory effort.  No retractions. Lungs CTAB. Gastrointestinal: Soft and nontender. No distention. No abdominal bruits. No CVA tenderness. Genitourinary:  Musculoskeletal: No lower extremity tenderness nor edema.  No joint effusions. Neurologic:  Normal speech and language. No gross focal neurologic deficits are appreciated. No gait instability. Skin:  Skin is warm, dry and intact. No  rash noted. Psychiatric: Mood and affect are normal. Speech and behavior are normal.  ____________________________________________   LABS (all labs ordered are listed, but only abnormal results are displayed)  Labs Reviewed  BASIC METABOLIC PANEL - Abnormal; Notable for the following components:      Result Value   Sodium 131 (*)    Calcium 8.7 (*)    All other components within normal limits  URINALYSIS, ROUTINE W REFLEX MICROSCOPIC - Abnormal; Notable for the following components:   Hgb urine dipstick MODERATE (*)    All other components within normal limits  URINALYSIS, MICROSCOPIC (REFLEX) - Abnormal; Notable for the following components:   Bacteria, UA RARE (*)    All other components within normal limits  CBC  PREGNANCY, URINE   ____________________________________________  EKG  *** ____________________________________________  RADIOLOGY  ED MD interpretation:    *** I, Rosalie Buenaventura, personally viewed and evaluated these images (plain radiographs) as  part of my medical decision making, as well as reviewing the written report by the radiologist.  Official radiology report(s): No results found.  ____________________________________________   PROCEDURES  Procedure(s) performed (including Critical Care):  Procedures  ____________________________________________   INITIAL IMPRESSION / ASSESSMENT AND PLAN     ***  DIFFERENTIAL DIAGNOSIS  ***  ED COURSE  ***     As part of my medical decision making, I reviewed the following data within the electronic MEDICAL RECORD NUMBER {[id number]::"Notes from prior ED visits","[place] Controlled Substance Database"}  ___________________________________________   FINAL CLINICAL IMPRESSION(S) / ED DIAGNOSES  Final diagnoses:  None     ED Discharge Orders     None        Lisa House was evaluated in Emergency Department on 10/10/2021 for the symptoms described in the history of present illness. She was  evaluated in the context of the global COVID-19 pandemic, which necessitated consideration that the patient might be at risk for infection with the SARS-CoV-2 virus that causes COVID-19. Institutional protocols and algorithms that pertain to the evaluation of patients at risk for COVID-19 are in a state of rapid change based on information released by regulatory bodies including the CDC and federal and state organizations. These policies and algorithms were followed during the patient's care in the ED.   Note:  This document was prepared using Dragon voice recognition software and may include unintentional dictation errors.

## 2021-10-10 NOTE — ED Provider Notes (Signed)
Emergency Medicine Provider Triage Evaluation Note  Lisa House, a 29 y.o. female  was evaluated in triage.  Pt complains of ongoing weakness and dizziness with a recent diagnosis of pneumonia.  Patient was treated last week with a Z-Pak for pneumonia.  She presents with some left-sided chest wall pain.  She denies any ongoing fevers or cough..  Review of Systems  Positive: Weakness, dizziness Negative: FCS  Physical Exam  Ht 5\' 6"  (1.676 m)   Wt 99.8 kg   BMI 35.51 kg/m  Gen:   Awake, no distress  NAD Resp:  Normal effort CTA MSK:   Moves extremities without difficulty   Other:  CVS: RRR  Medical Decision Making  Medically screening exam initiated at 11:35 AM.  Appropriate orders placed.  was informed that the remainder of the evaluation will be completed by another provider, this initial triage assessment does not replace that evaluation, and the importance of remaining in the ED until their evaluation is complete.  Patient with ED evaluation of ongoing dizziness and weakness with recent diagnosis of pneumonia.  She denies any fever, chills, or sweats.   [name], PA-C 10/10/21 1139    14/01/22, MD 10/10/21 1344

## 2021-12-02 DIAGNOSIS — R350 Frequency of micturition: Secondary | ICD-10-CM | POA: Diagnosis not present

## 2021-12-02 DIAGNOSIS — J020 Streptococcal pharyngitis: Secondary | ICD-10-CM | POA: Diagnosis not present

## 2021-12-02 DIAGNOSIS — R070 Pain in throat: Secondary | ICD-10-CM | POA: Diagnosis not present

## 2021-12-02 DIAGNOSIS — N390 Urinary tract infection, site not specified: Secondary | ICD-10-CM | POA: Diagnosis not present

## 2022-02-03 LAB — FETAL NONSTRESS TEST

## 2022-04-11 ENCOUNTER — Ambulatory Visit: Payer: Medicaid Other | Admitting: Internal Medicine

## 2022-04-11 NOTE — Progress Notes (Deleted)
   Acute Office Visit  Subjective:     Patient ID: Lisa House, female    DOB: 20-Mar-1991, 31 y.o.   MRN: [id number]  No chief complaint on file.   HPI Patient is in today for concerns about IUD.  ROS      Objective:    There were no vitals taken for this visit. {Vitals History (Optional):23777}  Physical Exam  No results found for any visits on 04/11/22.      Assessment & Plan:   Problem List Items Addressed This Visit   None   No orders of the defined types were placed in this encounter.   No follow-ups on file.  Margarita Mail, DO

## 2022-04-28 LAB — FETAL NONSTRESS TEST

## 2022-05-09 ENCOUNTER — Ambulatory Visit: Payer: Medicaid Other | Admitting: Family Medicine

## 2022-05-09 NOTE — Progress Notes (Unsigned)
Lisa Cory, MD   No chief complaint on file.   HPI:      [name] is a 31 y.o. [phone] whose LMP was No LMP recorded. (Menstrual status: IUD)., presents today for *** Mirena placed 06/25/18; IUD strings not visible in past but IUD in correct location per 07/05/18 u/s and 08/15/19 u/s.   Neg pap 06/25/18  Patient Active Problem List   Diagnosis Date Noted   Lumbar radiculopathy 04/27/2020   Neck pain 02/14/2020   Right ovarian cyst 08/16/2019   Family history of congenital aortic stenosis 09/23/2017   Herpes genitalis 02/08/2017   IBS (irritable bowel syndrome) 05/09/2015   Morbid obesity (HCC) 05/09/2015   OCD (obsessive compulsive disorder) 05/09/2015   Bilateral polycystic ovarian syndrome 05/09/2015   Psoriasis 05/09/2015   History of depression 05/09/2015   Allergic rhinitis 05/02/2010    Past Surgical History:  Procedure Laterality Date   CHOLECYSTECTOMY  01/09/2011   Dr. Excell Seltzer    Family History  Problem Relation Age of Onset   Hypertension Mother    Diabetes Mother    Headache Mother    Stroke Mother    Hypertension Father    Lung cancer Father    Asthma Father    Heart disease Son        Aortic Valve Disorder Stenosis-had balloon valvuloplasty, on meds   Ovarian cancer Maternal Grandmother    Colon cancer Maternal Grandfather    Cleft palate Son     Social History   Socioeconomic History   Marital status: Single    Spouse name: Not on file   Number of children: 3   Years of education: high school   Highest education level: High school graduate  Occupational History   Occupation: Environmental consultant: [employer]  Tobacco Use   Smoking status: Every Day    Types: Cigarettes   Smokeless tobacco: Never  Vaping Use   Vaping Use: Never used  Substance and Sexual Activity   Alcohol use: No    Alcohol/week: 0.0 standard drinks of alcohol   Drug use: No   Sexual activity: Yes    Partners: Male    Birth  control/protection: I.U.D.    Comment: Mirena  Other Topics Concern   Not on file  Social History Narrative   Not on file   Social Determinants of Health   Financial Resource Strain: Medium Risk (07/02/2018)   Overall Financial Resource Strain (CARDIA)    Difficulty of Paying Living Expenses: Somewhat hard  Food Insecurity: No Food Insecurity (07/02/2018)   Hunger Vital Sign    Worried About Running Out of Food in the Last Year: Never true    Ran Out of Food in the Last Year: Never true  Transportation Needs: No Transportation Needs (07/02/2018)   PRAPARE - Administrator, Civil Service (Medical): No    Lack of Transportation (Non-Medical): No  Physical Activity: Sufficiently Active (07/02/2018)   Exercise Vital Sign    Days of Exercise per Week: 7 days    Minutes of Exercise per Session: 30 min  Stress: No Stress Concern Present (07/02/2018)   [name] of Occupational Health - Occupational Stress Questionnaire    Feeling of Stress : Not at all  Social Connections: Somewhat Isolated (07/02/2018)   Social Connection and Isolation Panel [NHANES]    Frequency of Communication with Friends and Family: More than three times a week    Frequency of Social Gatherings  with Friends and Family: More than three times a week    Attends Religious Services: More than 4 times per year    Active Member of Clubs or Organizations: No    Attends Banker Meetings: Never    Marital Status: Never married  Intimate Partner Violence: Not At Risk (04/09/2021)   Humiliation, Afraid, Rape, and Kick questionnaire    Fear of Current or Ex-Partner: No    Emotionally Abused: No    Physically Abused: No    Sexually Abused: No    Outpatient Medications Prior to Visit  Medication Sig Dispense Refill   albuterol (PROVENTIL HFA) 108 (90 Base) MCG/ACT inhaler Inhale 2 puffs into the lungs every 4 (four) hours as needed for wheezing or shortness of breath. 1 each 0   azithromycin  (ZITHROMAX Z-PAK) 250 MG tablet 2 pills today then 1 pill a day for 4 days 6 each 0   chlorpheniramine-HYDROcodone (TUSSIONEX PENNKINETIC ER) 10-8 MG/5ML SUER Take 5 mLs by mouth every 12 (twelve) hours as needed for cough. 150 mL 0   fexofenadine-pseudoephedrine (ALLEGRA-D) 60-120 MG 12 hr tablet Take 1 tablet by mouth 2 (two) times daily. 20 tablet 0   levonorgestrel (MIRENA, 52 MG,) 20 MCG/24HR IUD 1 Intra Uterine Device (1 each total) by Intrauterine route once for 1 dose. 1 each 0   omeprazole (PRILOSEC) 40 MG capsule Take 1 capsule (40 mg total) by mouth daily. 90 capsule 0   ondansetron (ZOFRAN ODT) 4 MG disintegrating tablet Take 1 tablet (4 mg total) by mouth every 8 (eight) hours as needed for nausea or vomiting. 20 tablet 0   ondansetron (ZOFRAN) 4 MG tablet Take 1 tablet (4 mg total) by mouth every 8 (eight) hours as needed for nausea or vomiting. 12 tablet 0   pimecrolimus (ELIDEL) 1 % cream APPLY EXTERNALLY TO THE AFFECTED AREA TWICE DAILY 100 g 0   propranolol (INDERAL) 20 MG tablet Take 1 tablet (20 mg total) by mouth 3 (three) times daily as needed. 30 tablet 0   triamcinolone cream (KENALOG) 0.1 % Apply topically 2 (two) times daily. 453.6 g 0   valACYclovir (VALTREX) 500 MG tablet Take 1 tablet (500 mg total) by mouth 2 (two) times daily as needed. Prn outbreak x 3 days 30 tablet 1   No facility-administered medications prior to visit.      ROS:  Review of Systems BREAST: No symptoms   OBJECTIVE:   Vitals:  There were no vitals taken for this visit.  Physical Exam  Results: No results found for this or any previous visit (from the past 24 hour(s)).   Assessment/Plan: No diagnosis found.    No orders of the defined types were placed in this encounter.     No follow-ups on file.  Garland Hincapie B. Julien Oscar, PA-C 05/09/2022 3:19 PM

## 2022-05-12 ENCOUNTER — Encounter: Payer: Self-pay | Admitting: Obstetrics and Gynecology

## 2022-05-12 ENCOUNTER — Other Ambulatory Visit (HOSPITAL_COMMUNITY)
Admission: RE | Admit: 2022-05-12 | Discharge: 2022-05-12 | Disposition: A | Discharge status: Home / Self Care | Payer: Medicaid Other | Source: Ambulatory Visit | Attending: Family Medicine | Admitting: Family Medicine

## 2022-05-12 ENCOUNTER — Ambulatory Visit (INDEPENDENT_AMBULATORY_CARE_PROVIDER_SITE_OTHER): Payer: Medicaid Other | Admitting: Obstetrics and Gynecology

## 2022-05-12 VITALS — BP 110/60 | HR 72 | Ht 65.0 in | Wt 208.0 lb

## 2022-05-12 DIAGNOSIS — N9089 Other specified noninflammatory disorders of vulva and perineum: Secondary | ICD-10-CM | POA: Diagnosis not present

## 2022-05-12 DIAGNOSIS — Z124 Encounter for screening for malignant neoplasm of cervix: Secondary | ICD-10-CM

## 2022-05-12 DIAGNOSIS — Z1151 Encounter for screening for human papillomavirus (HPV): Secondary | ICD-10-CM | POA: Insufficient documentation

## 2022-05-12 DIAGNOSIS — Z30431 Encounter for routine checking of intrauterine contraceptive device: Secondary | ICD-10-CM | POA: Diagnosis not present

## 2022-05-12 NOTE — Patient Instructions (Signed)
I value your feedback and you entrusting us with your care. If you get a [facility] patient survey, I would appreciate you taking the time to let us know about your experience today. Thank you! ? ? ?

## 2022-05-14 LAB — CYTOLOGY - PAP
Comment: NEGATIVE
Diagnosis: NEGATIVE
High risk HPV: NEGATIVE

## 2022-09-09 IMAGING — CR DG CHEST 2V
1 series · 2 of 2 positions shown · non-contrast
Comparison: 07/17/2021

CLINICAL DATA: Cough

EXAM:
CHEST - 2 VIEW

[Series 1: dg chest 2 view · 0.14mm/px · 2 of 2 slices shown]
[im 1/2]
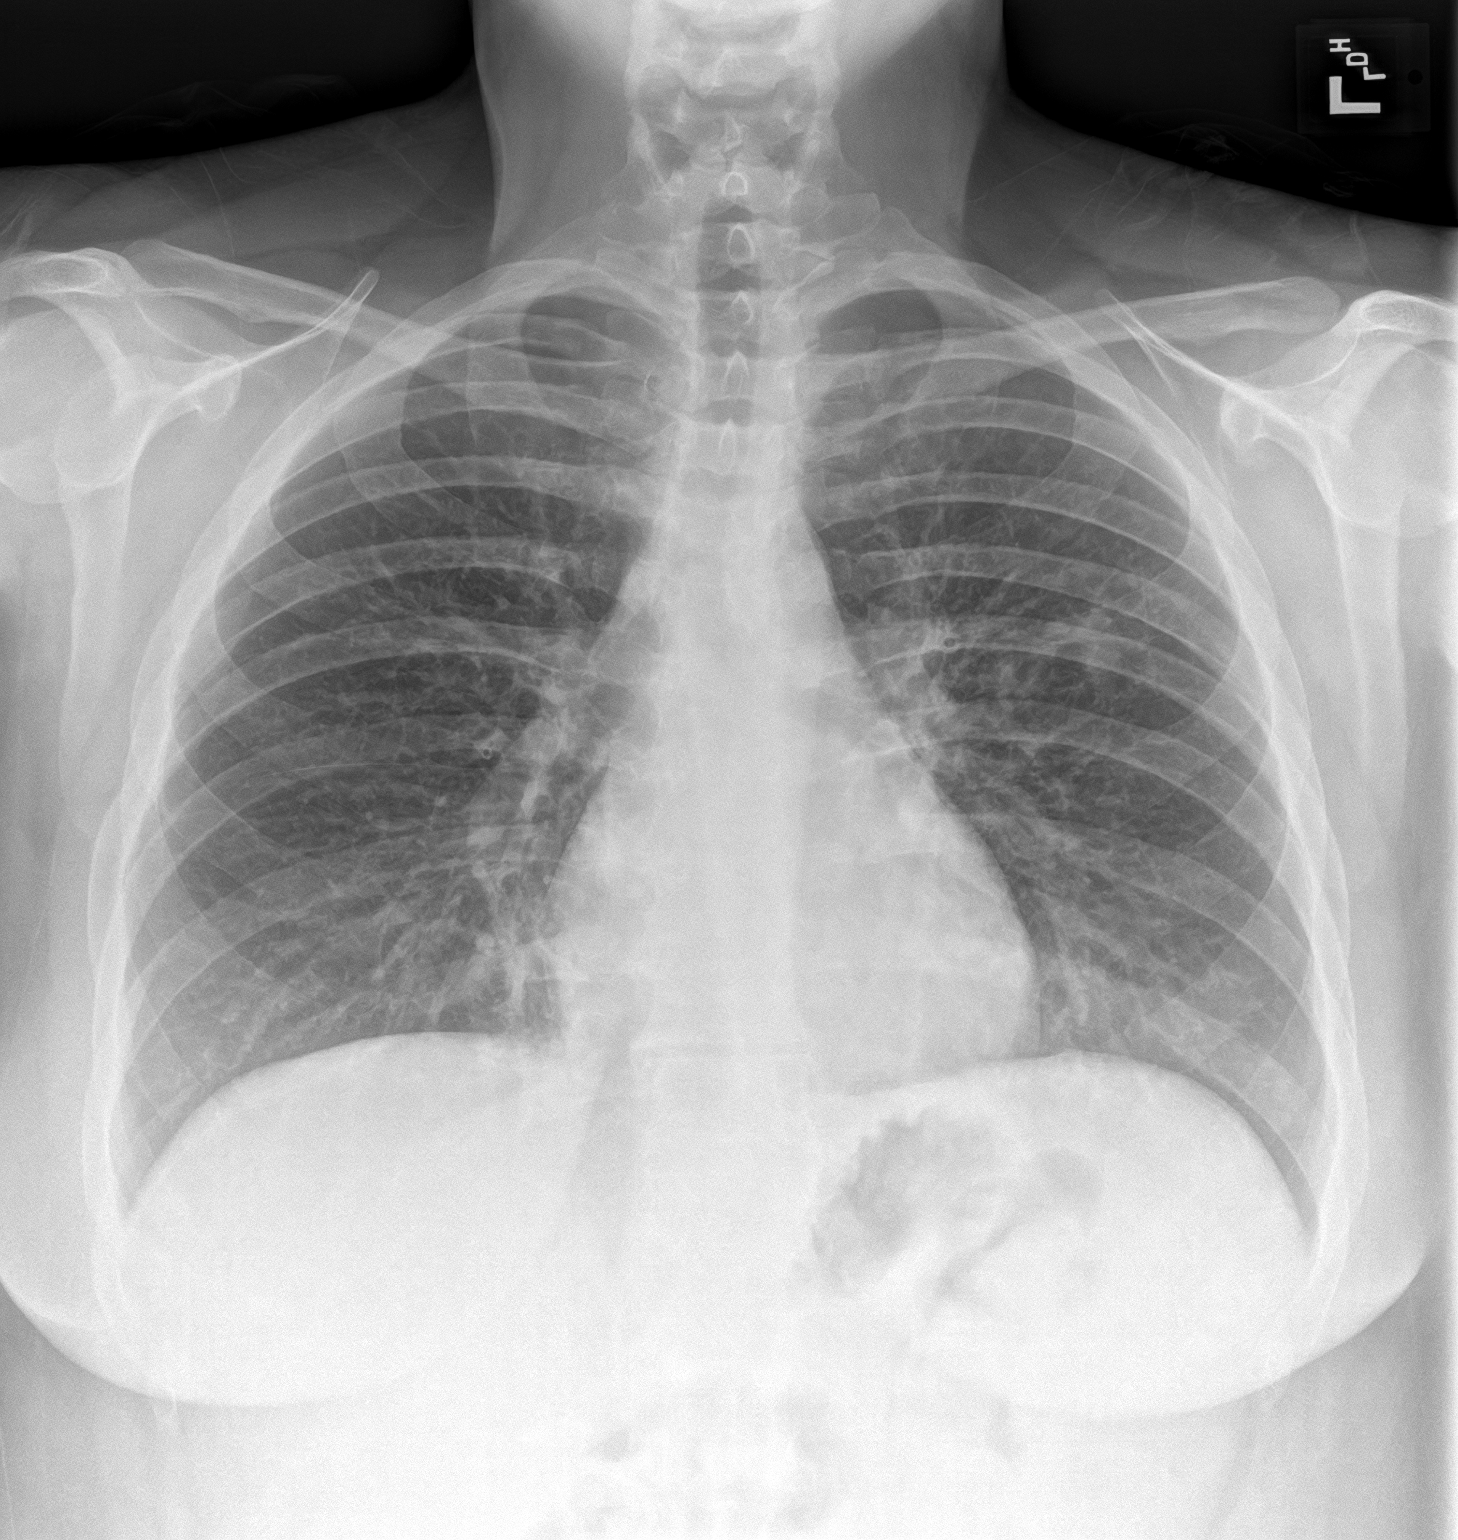
[im 2/2]
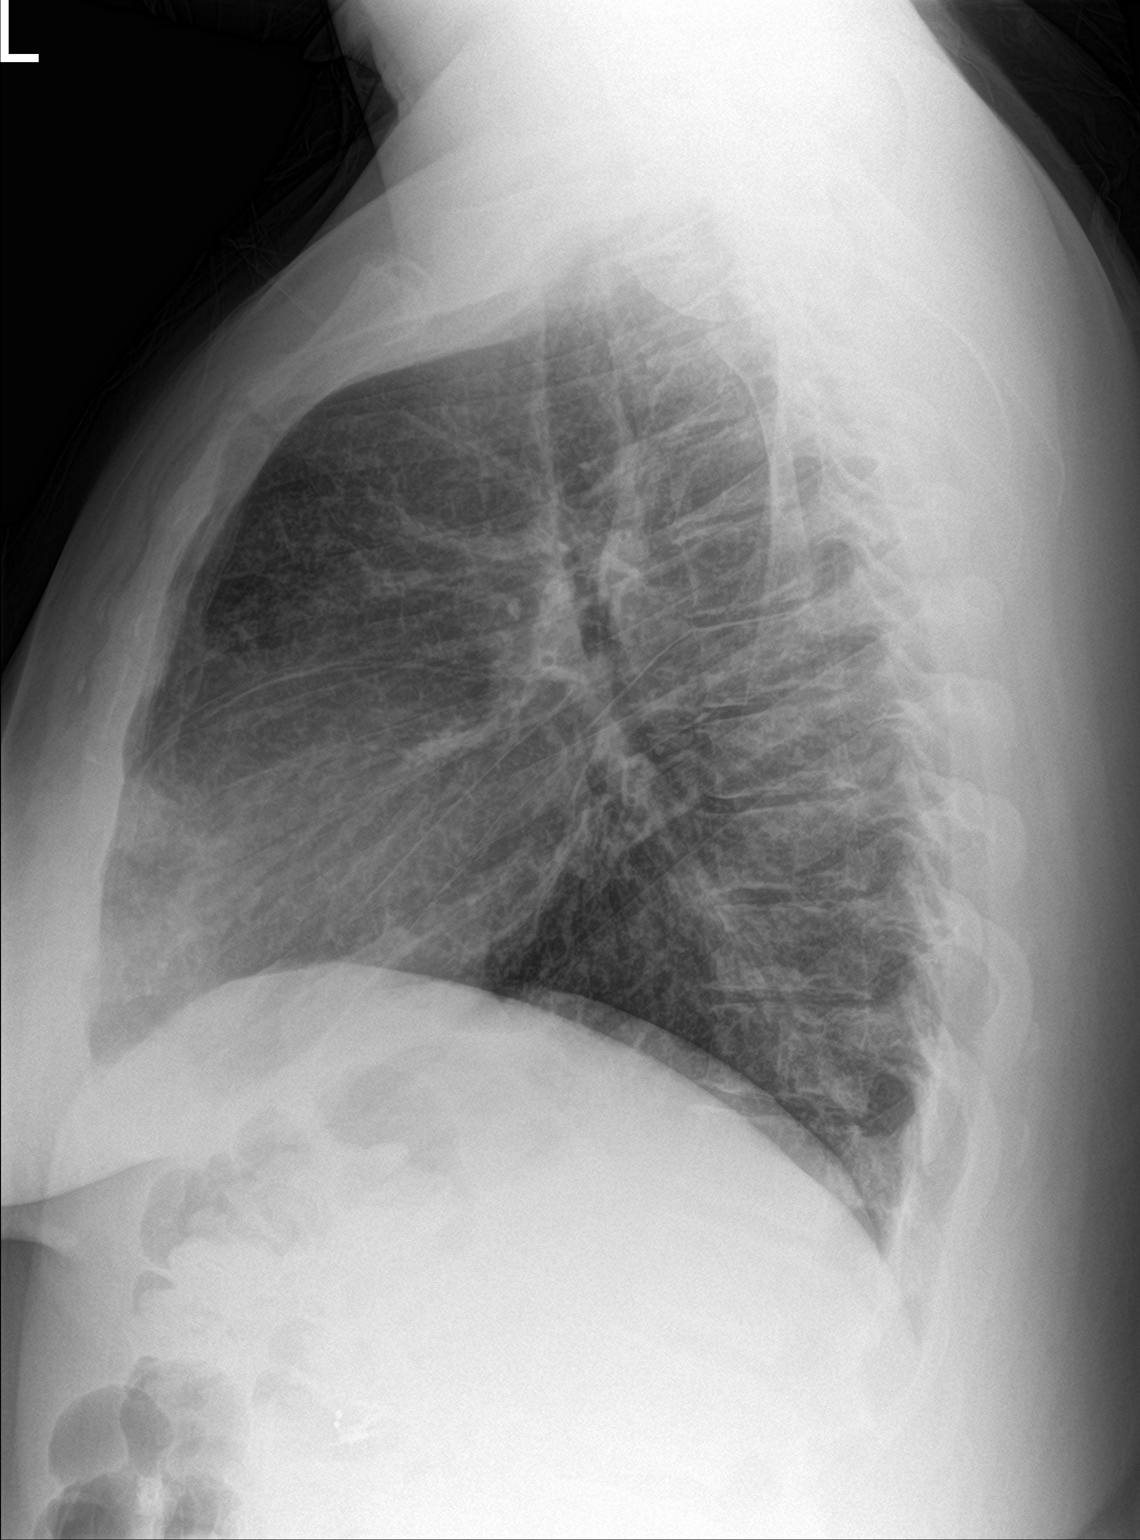

[2 of 2 positions shown; findings below may reference images not displayed]

FINDINGS: Cardiac size is within normal limits. There are no signs of
pulmonary edema. There is peribronchial thickening. Small patchy
infiltrate is seen in the left parahilar region. There is no pleural
effusion or pneumothorax.
IMPRESSION: New small patchy infiltrate is seen in the left parahilar region
suggesting pneumonia. Peribronchial thickening suggests bronchitis.

## 2022-09-10 ENCOUNTER — Emergency Department: Payer: Medicaid Other

## 2022-09-10 ENCOUNTER — Emergency Department
Admission: EM | Admit: 2022-09-10 | Discharge: 2022-09-10 | Disposition: A | Discharge status: Home / Self Care | Payer: Medicaid Other | Attending: Emergency Medicine | Admitting: Emergency Medicine

## 2022-09-10 DIAGNOSIS — G43909 Migraine, unspecified, not intractable, without status migrainosus: Secondary | ICD-10-CM | POA: Diagnosis not present

## 2022-09-10 DIAGNOSIS — G43809 Other migraine, not intractable, without status migrainosus: Secondary | ICD-10-CM

## 2022-09-10 DIAGNOSIS — F17200 Nicotine dependence, unspecified, uncomplicated: Secondary | ICD-10-CM | POA: Diagnosis not present

## 2022-09-10 DIAGNOSIS — R079 Chest pain, unspecified: Secondary | ICD-10-CM

## 2022-09-10 DIAGNOSIS — R0602 Shortness of breath: Secondary | ICD-10-CM | POA: Diagnosis not present

## 2022-09-10 DIAGNOSIS — J40 Bronchitis, not specified as acute or chronic: Secondary | ICD-10-CM | POA: Diagnosis not present

## 2022-09-10 DIAGNOSIS — R0789 Other chest pain: Secondary | ICD-10-CM | POA: Diagnosis not present

## 2022-09-10 LAB — BASIC METABOLIC PANEL
Anion gap: 4 — ABNORMAL LOW (ref 5–15)
BUN: 11 mg/dL (ref 6–20)
CO2: 27 mmol/L (ref 22–32)
Calcium: 8.5 mg/dL — ABNORMAL LOW (ref 8.9–10.3)
Chloride: 103 mmol/L (ref 98–111)
Creatinine, Ser: 0.86 mg/dL (ref 0.44–1.00)
GFR, Estimated: >60 mL/min (ref >60)
Glucose, Bld: 113 mg/dL — ABNORMAL HIGH (ref 70–99)
Potassium: 3.8 mmol/L (ref 3.5–5.1)
Sodium: 134 mmol/L — ABNORMAL LOW (ref 135–145)

## 2022-09-10 LAB — CBC
HCT: 38.5 % (ref 36.0–46.0)
Hemoglobin: 12.7 g/dL (ref 12.0–15.0)
MCH: 28.9 pg (ref 26.0–34.0)
MCHC: 33 g/dL (ref 30.0–36.0)
MCV: 87.7 fL (ref 80.0–100.0)
Platelets: 189 10*3/uL (ref 150–400)
RBC: 4.39 MIL/uL (ref 3.87–5.11)
RDW: 13.3 % (ref 11.5–15.5)
WBC: 8.4 10*3/uL (ref 4.0–10.5)
nRBC: 0 % (ref 0.0–0.2)

## 2022-09-10 LAB — TROPONIN I (HIGH SENSITIVITY): Troponin I (High Sensitivity): <2 ng/L (ref <18)

## 2022-09-10 MED ORDER — ALBUTEROL SULFATE HFA 108 (90 BASE) MCG/ACT IN AERS: 2.0000 | INHALATION_SPRAY | Freq: Four times a day (QID) | RESPIRATORY_TRACT | 2 refills | Status: DC | PRN

## 2022-09-10 MED ORDER — PREDNISONE 20 MG PO TABS: 40.0000 mg | ORAL_TABLET | Freq: Every day | ORAL | 0 refills | Status: AC

## 2022-09-10 MED ORDER — BUTALBITAL-APAP-CAFFEINE 50-325-40 MG PO TABS: 1.0000 | ORAL_TABLET | Freq: Four times a day (QID) | ORAL | 0 refills | Status: AC | PRN

## 2022-09-10 NOTE — ED Triage Notes (Signed)
Pt endorses CP that going to her abd and back. Pt sts that it has been happening for two weeks now. Pt has not taken any OTC meds for the pain.

## 2022-09-10 NOTE — ED Provider Notes (Signed)
[facility] Provider Note    Event Date/Time   First MD Initiated Contact with Patient 09/10/22 1722     (approximate)  History   Chief Complaint: Chest Pain  HPI  Lisa House is a 31 y.o. female with a past medical history of gastric reflux, depression, presents emergency department for multiple complaints.  According to the patient for the past 2 weeks or so she has been experiencing pain in her chest.  She also states an occasional cough and occasional wheeze.  Patient is a daily smoker.  Patient also complains of headaches which have been occurring more frequently she states multiple times a week she now will get a migraine headache.  Patient states she has taken Tylenol with some relief.  Patient denies any weakness or numbness.  As a third complaint the patient states over the last several weeks she has noted at times she feels like her legs are mildly swollen.  Denies any shortness of breath.  Physical Exam   Triage Vital Signs: ED Triage Vitals  Enc Vitals Group     BP 09/10/22 1535 136/70     Pulse Rate 09/10/22 1535 84     Resp 09/10/22 1535 17     Temp 09/10/22 1535 98.1 F (36.7 C)     Temp src --      SpO2 09/10/22 1535 98 %     Weight 09/10/22 1524 201 lb (91.2 kg)     Height --      Head Circumference --      Peak Flow --      Pain Score 09/10/22 1524 5     Pain Loc --      Pain Edu? --      Excl. in [place]? --     Most recent vital signs: Vitals:   09/10/22 1535  BP: 136/70  Pulse: 84  Resp: 17  Temp: 98.1 F (36.7 C)  SpO2: 98%    General: Awake, no distress.  CV:  Good peripheral perfusion.  Regular rate and rhythm  Resp:  Normal effort.  Equal breath sounds bilaterally.  Very slight end expiratory wheeze. Abd:  No distention.  Soft, nontender.  No rebound or guarding. Other:  Possible very slight pedal edema on exam.  No significant edema noted.  No calf tenderness.   ED Results / Procedures / Treatments    EKG  EKG viewed and interpreted by myself shows a normal sinus rhythm at 62 bpm with a narrow QRS, normal axis, normal intervals, no concerning ST changes.  RADIOLOGY  I have reviewed and interpreted the chest x-ray images.  No consolidation seen on my evaluation. Radiology has read the chest x-ray as negative.   MEDICATIONS ORDERED IN ED: Medications - No data to display   IMPRESSION / MDM / [place] / ED COURSE  I reviewed the triage vital signs and the nursing notes.  Patient's presentation is most consistent with acute presentation with potential threat to life or bodily function.  \Patient with NSTEMI emergency department with multiple complaints.  Firstly patient is complaining of 2 weeks of intermittent chest pain she does have a slight cough on occasion is a daily smoker.  Mild end expiratory wheeze on exam.  Reassuringly patient's labs are largely within normal limits including normal CBC reassuring chemistry and a negative troponin.  EKG is reassuring and chest x-ray is clear.  Given the patient's slight cough with central chest pain and end expiratory wheeze,  highly suspect bronchitis.  We will discharge patient on a burst course of prednisone, albuterol inhaler and I have discussed with the patient smoking cessation.  As far as the patient's lower extreme edema there is no significant edema on my evaluation.  Patient is to follow-up with her PCP regarding any further work-up such as an echocardiogram.  Given the patient's third complaint of increased frequency of migraine headaches I discussed with the patient again following up with a PCP for ongoing management but we will prescribe Fioricet.  I discussed with the patient acetaminophen limits to ensure that she does not take more than 4 g of acetaminophen in any 24-hour period.  Patient agreeable to plan of care.  Given the patient's reassuring work-up I believe the patient is safe for discharge home with PCP follow-up.   Patient is agreeable to plan of care.  FINAL CLINICAL IMPRESSION(S) / ED DIAGNOSES   Chest pain Migraine Bronchitis  Rx / DC Orders   Prednisone Fioricet Albuterol  Note:  This document was prepared using Dragon voice recognition software and may include unintentional dictation errors.   Minna Antis, MD 09/10/22 1750

## 2022-09-10 NOTE — ED Provider Triage Note (Signed)
Emergency Medicine Provider Triage Evaluation Note  Lisa House , a 31 y.o. female  was evaluated in triage.  Pt complains of chest pain x2 weeks.  Feels dull achy.  States she has been getting sick a lot lately with vomiting.  History of cholecystectomy.  Review of Systems  Positive:  Negative:   Physical Exam  Wt 91.2 kg   BMI 33.45 kg/m  Gen:   Awake, no distress   Resp:  Normal effort  MSK:   Moves extremities without difficulty  Other:    Medical Decision Making  Medically screening exam initiated at 3:34 PM.  Appropriate orders placed.  Lisa House was informed that the remainder of the evaluation will be completed by another provider, this initial triage assessment does not replace that evaluation, and the importance of remaining in the ED until their evaluation is complete.     Lisa Starks, PA-C 09/10/22 1534

## 2023-01-08 ENCOUNTER — Ambulatory Visit: Payer: Medicaid Other | Admitting: Family Medicine

## 2023-01-29 DIAGNOSIS — R070 Pain in throat: Secondary | ICD-10-CM | POA: Diagnosis not present

## 2023-01-29 DIAGNOSIS — J039 Acute tonsillitis, unspecified: Secondary | ICD-10-CM | POA: Diagnosis not present

## 2024-01-07 ENCOUNTER — Ambulatory Visit: Payer: Self-pay | Admitting: Physician Assistant

## 2024-02-11 DIAGNOSIS — F339 Major depressive disorder, recurrent, unspecified: Secondary | ICD-10-CM | POA: Diagnosis not present

## 2024-02-11 DIAGNOSIS — F411 Generalized anxiety disorder: Secondary | ICD-10-CM | POA: Diagnosis not present

## 2024-02-11 DIAGNOSIS — F4310 Post-traumatic stress disorder, unspecified: Secondary | ICD-10-CM | POA: Diagnosis not present

## 2024-02-12 DIAGNOSIS — F411 Generalized anxiety disorder: Secondary | ICD-10-CM | POA: Diagnosis not present

## 2024-02-12 DIAGNOSIS — F4310 Post-traumatic stress disorder, unspecified: Secondary | ICD-10-CM | POA: Diagnosis not present

## 2024-02-12 DIAGNOSIS — F339 Major depressive disorder, recurrent, unspecified: Secondary | ICD-10-CM | POA: Diagnosis not present

## 2024-02-13 DIAGNOSIS — F4310 Post-traumatic stress disorder, unspecified: Secondary | ICD-10-CM | POA: Diagnosis not present

## 2024-02-13 DIAGNOSIS — F411 Generalized anxiety disorder: Secondary | ICD-10-CM | POA: Diagnosis not present

## 2024-02-13 DIAGNOSIS — F339 Major depressive disorder, recurrent, unspecified: Secondary | ICD-10-CM | POA: Diagnosis not present

## 2024-02-18 DIAGNOSIS — F4310 Post-traumatic stress disorder, unspecified: Secondary | ICD-10-CM | POA: Diagnosis not present

## 2024-02-18 DIAGNOSIS — F339 Major depressive disorder, recurrent, unspecified: Secondary | ICD-10-CM | POA: Diagnosis not present

## 2024-02-18 DIAGNOSIS — F411 Generalized anxiety disorder: Secondary | ICD-10-CM | POA: Diagnosis not present

## 2024-02-19 DIAGNOSIS — F411 Generalized anxiety disorder: Secondary | ICD-10-CM | POA: Diagnosis not present

## 2024-02-19 DIAGNOSIS — F339 Major depressive disorder, recurrent, unspecified: Secondary | ICD-10-CM | POA: Diagnosis not present

## 2024-02-19 DIAGNOSIS — F4310 Post-traumatic stress disorder, unspecified: Secondary | ICD-10-CM | POA: Diagnosis not present

## 2024-02-20 DIAGNOSIS — F411 Generalized anxiety disorder: Secondary | ICD-10-CM | POA: Diagnosis not present

## 2024-02-20 DIAGNOSIS — F339 Major depressive disorder, recurrent, unspecified: Secondary | ICD-10-CM | POA: Diagnosis not present

## 2024-02-20 DIAGNOSIS — F4310 Post-traumatic stress disorder, unspecified: Secondary | ICD-10-CM | POA: Diagnosis not present

## 2024-02-25 DIAGNOSIS — F4310 Post-traumatic stress disorder, unspecified: Secondary | ICD-10-CM | POA: Diagnosis not present

## 2024-02-25 DIAGNOSIS — F339 Major depressive disorder, recurrent, unspecified: Secondary | ICD-10-CM | POA: Diagnosis not present

## 2024-02-25 DIAGNOSIS — F411 Generalized anxiety disorder: Secondary | ICD-10-CM | POA: Diagnosis not present

## 2024-02-26 DIAGNOSIS — F4310 Post-traumatic stress disorder, unspecified: Secondary | ICD-10-CM | POA: Diagnosis not present

## 2024-02-26 DIAGNOSIS — F339 Major depressive disorder, recurrent, unspecified: Secondary | ICD-10-CM | POA: Diagnosis not present

## 2024-02-26 DIAGNOSIS — F411 Generalized anxiety disorder: Secondary | ICD-10-CM | POA: Diagnosis not present

## 2024-03-03 DIAGNOSIS — F339 Major depressive disorder, recurrent, unspecified: Secondary | ICD-10-CM | POA: Diagnosis not present

## 2024-03-03 DIAGNOSIS — F4310 Post-traumatic stress disorder, unspecified: Secondary | ICD-10-CM | POA: Diagnosis not present

## 2024-03-03 DIAGNOSIS — F411 Generalized anxiety disorder: Secondary | ICD-10-CM | POA: Diagnosis not present

## 2024-03-04 DIAGNOSIS — F4310 Post-traumatic stress disorder, unspecified: Secondary | ICD-10-CM | POA: Diagnosis not present

## 2024-03-04 DIAGNOSIS — F411 Generalized anxiety disorder: Secondary | ICD-10-CM | POA: Diagnosis not present

## 2024-03-04 DIAGNOSIS — F339 Major depressive disorder, recurrent, unspecified: Secondary | ICD-10-CM | POA: Diagnosis not present

## 2024-03-05 DIAGNOSIS — F411 Generalized anxiety disorder: Secondary | ICD-10-CM | POA: Diagnosis not present

## 2024-03-05 DIAGNOSIS — F339 Major depressive disorder, recurrent, unspecified: Secondary | ICD-10-CM | POA: Diagnosis not present

## 2024-03-05 DIAGNOSIS — F4310 Post-traumatic stress disorder, unspecified: Secondary | ICD-10-CM | POA: Diagnosis not present

## 2024-03-10 DIAGNOSIS — F411 Generalized anxiety disorder: Secondary | ICD-10-CM | POA: Diagnosis not present

## 2024-03-10 DIAGNOSIS — F4310 Post-traumatic stress disorder, unspecified: Secondary | ICD-10-CM | POA: Diagnosis not present

## 2024-03-10 DIAGNOSIS — F339 Major depressive disorder, recurrent, unspecified: Secondary | ICD-10-CM | POA: Diagnosis not present

## 2024-03-12 DIAGNOSIS — F339 Major depressive disorder, recurrent, unspecified: Secondary | ICD-10-CM | POA: Diagnosis not present

## 2024-03-12 DIAGNOSIS — F411 Generalized anxiety disorder: Secondary | ICD-10-CM | POA: Diagnosis not present

## 2024-03-12 DIAGNOSIS — F4310 Post-traumatic stress disorder, unspecified: Secondary | ICD-10-CM | POA: Diagnosis not present

## 2024-03-17 DIAGNOSIS — F4310 Post-traumatic stress disorder, unspecified: Secondary | ICD-10-CM | POA: Diagnosis not present

## 2024-03-17 DIAGNOSIS — F411 Generalized anxiety disorder: Secondary | ICD-10-CM | POA: Diagnosis not present

## 2024-03-17 DIAGNOSIS — F339 Major depressive disorder, recurrent, unspecified: Secondary | ICD-10-CM | POA: Diagnosis not present

## 2024-03-19 DIAGNOSIS — F4310 Post-traumatic stress disorder, unspecified: Secondary | ICD-10-CM | POA: Diagnosis not present

## 2024-03-19 DIAGNOSIS — F411 Generalized anxiety disorder: Secondary | ICD-10-CM | POA: Diagnosis not present

## 2024-03-19 DIAGNOSIS — F339 Major depressive disorder, recurrent, unspecified: Secondary | ICD-10-CM | POA: Diagnosis not present

## 2024-03-24 DIAGNOSIS — F411 Generalized anxiety disorder: Secondary | ICD-10-CM | POA: Diagnosis not present

## 2024-03-24 DIAGNOSIS — F339 Major depressive disorder, recurrent, unspecified: Secondary | ICD-10-CM | POA: Diagnosis not present

## 2024-03-24 DIAGNOSIS — F4310 Post-traumatic stress disorder, unspecified: Secondary | ICD-10-CM | POA: Diagnosis not present

## 2024-03-25 DIAGNOSIS — F4310 Post-traumatic stress disorder, unspecified: Secondary | ICD-10-CM | POA: Diagnosis not present

## 2024-03-25 DIAGNOSIS — F411 Generalized anxiety disorder: Secondary | ICD-10-CM | POA: Diagnosis not present

## 2024-03-25 DIAGNOSIS — F339 Major depressive disorder, recurrent, unspecified: Secondary | ICD-10-CM | POA: Diagnosis not present

## 2024-03-31 DIAGNOSIS — F411 Generalized anxiety disorder: Secondary | ICD-10-CM | POA: Diagnosis not present

## 2024-03-31 DIAGNOSIS — F4310 Post-traumatic stress disorder, unspecified: Secondary | ICD-10-CM | POA: Diagnosis not present

## 2024-03-31 DIAGNOSIS — F339 Major depressive disorder, recurrent, unspecified: Secondary | ICD-10-CM | POA: Diagnosis not present

## 2024-04-01 DIAGNOSIS — F339 Major depressive disorder, recurrent, unspecified: Secondary | ICD-10-CM | POA: Diagnosis not present

## 2024-04-01 DIAGNOSIS — F411 Generalized anxiety disorder: Secondary | ICD-10-CM | POA: Diagnosis not present

## 2024-04-01 DIAGNOSIS — F4310 Post-traumatic stress disorder, unspecified: Secondary | ICD-10-CM | POA: Diagnosis not present

## 2024-04-02 DIAGNOSIS — F411 Generalized anxiety disorder: Secondary | ICD-10-CM | POA: Diagnosis not present

## 2024-04-02 DIAGNOSIS — F4310 Post-traumatic stress disorder, unspecified: Secondary | ICD-10-CM | POA: Diagnosis not present

## 2024-04-02 DIAGNOSIS — F339 Major depressive disorder, recurrent, unspecified: Secondary | ICD-10-CM | POA: Diagnosis not present

## 2024-04-07 DIAGNOSIS — F339 Major depressive disorder, recurrent, unspecified: Secondary | ICD-10-CM | POA: Diagnosis not present

## 2024-04-07 DIAGNOSIS — F411 Generalized anxiety disorder: Secondary | ICD-10-CM | POA: Diagnosis not present

## 2024-04-07 DIAGNOSIS — F4310 Post-traumatic stress disorder, unspecified: Secondary | ICD-10-CM | POA: Diagnosis not present

## 2024-04-08 DIAGNOSIS — F411 Generalized anxiety disorder: Secondary | ICD-10-CM | POA: Diagnosis not present

## 2024-04-08 DIAGNOSIS — F4310 Post-traumatic stress disorder, unspecified: Secondary | ICD-10-CM | POA: Diagnosis not present

## 2024-04-08 DIAGNOSIS — F339 Major depressive disorder, recurrent, unspecified: Secondary | ICD-10-CM | POA: Diagnosis not present

## 2024-04-09 DIAGNOSIS — F411 Generalized anxiety disorder: Secondary | ICD-10-CM | POA: Diagnosis not present

## 2024-04-09 DIAGNOSIS — F339 Major depressive disorder, recurrent, unspecified: Secondary | ICD-10-CM | POA: Diagnosis not present

## 2024-04-09 DIAGNOSIS — F4310 Post-traumatic stress disorder, unspecified: Secondary | ICD-10-CM | POA: Diagnosis not present

## 2024-04-14 DIAGNOSIS — F411 Generalized anxiety disorder: Secondary | ICD-10-CM | POA: Diagnosis not present

## 2024-04-14 DIAGNOSIS — F339 Major depressive disorder, recurrent, unspecified: Secondary | ICD-10-CM | POA: Diagnosis not present

## 2024-04-14 DIAGNOSIS — F4310 Post-traumatic stress disorder, unspecified: Secondary | ICD-10-CM | POA: Diagnosis not present

## 2024-04-15 DIAGNOSIS — F339 Major depressive disorder, recurrent, unspecified: Secondary | ICD-10-CM | POA: Diagnosis not present

## 2024-04-15 DIAGNOSIS — F411 Generalized anxiety disorder: Secondary | ICD-10-CM | POA: Diagnosis not present

## 2024-04-15 DIAGNOSIS — F4310 Post-traumatic stress disorder, unspecified: Secondary | ICD-10-CM | POA: Diagnosis not present

## 2024-04-16 DIAGNOSIS — F411 Generalized anxiety disorder: Secondary | ICD-10-CM | POA: Diagnosis not present

## 2024-04-16 DIAGNOSIS — F4310 Post-traumatic stress disorder, unspecified: Secondary | ICD-10-CM | POA: Diagnosis not present

## 2024-04-16 DIAGNOSIS — F339 Major depressive disorder, recurrent, unspecified: Secondary | ICD-10-CM | POA: Diagnosis not present

## 2024-04-21 DIAGNOSIS — F339 Major depressive disorder, recurrent, unspecified: Secondary | ICD-10-CM | POA: Diagnosis not present

## 2024-04-21 DIAGNOSIS — F411 Generalized anxiety disorder: Secondary | ICD-10-CM | POA: Diagnosis not present

## 2024-04-21 DIAGNOSIS — F4310 Post-traumatic stress disorder, unspecified: Secondary | ICD-10-CM | POA: Diagnosis not present

## 2024-04-23 DIAGNOSIS — F4310 Post-traumatic stress disorder, unspecified: Secondary | ICD-10-CM | POA: Diagnosis not present

## 2024-04-23 DIAGNOSIS — F339 Major depressive disorder, recurrent, unspecified: Secondary | ICD-10-CM | POA: Diagnosis not present

## 2024-04-23 DIAGNOSIS — F411 Generalized anxiety disorder: Secondary | ICD-10-CM | POA: Diagnosis not present

## 2024-04-28 DIAGNOSIS — F4310 Post-traumatic stress disorder, unspecified: Secondary | ICD-10-CM | POA: Diagnosis not present

## 2024-04-28 DIAGNOSIS — F339 Major depressive disorder, recurrent, unspecified: Secondary | ICD-10-CM | POA: Diagnosis not present

## 2024-04-28 DIAGNOSIS — F411 Generalized anxiety disorder: Secondary | ICD-10-CM | POA: Diagnosis not present

## 2024-04-29 DIAGNOSIS — F411 Generalized anxiety disorder: Secondary | ICD-10-CM | POA: Diagnosis not present

## 2024-04-29 DIAGNOSIS — F339 Major depressive disorder, recurrent, unspecified: Secondary | ICD-10-CM | POA: Diagnosis not present

## 2024-04-29 DIAGNOSIS — F4310 Post-traumatic stress disorder, unspecified: Secondary | ICD-10-CM | POA: Diagnosis not present

## 2024-04-30 DIAGNOSIS — F339 Major depressive disorder, recurrent, unspecified: Secondary | ICD-10-CM | POA: Diagnosis not present

## 2024-04-30 DIAGNOSIS — F4310 Post-traumatic stress disorder, unspecified: Secondary | ICD-10-CM | POA: Diagnosis not present

## 2024-04-30 DIAGNOSIS — F411 Generalized anxiety disorder: Secondary | ICD-10-CM | POA: Diagnosis not present

## 2024-05-05 DIAGNOSIS — F4310 Post-traumatic stress disorder, unspecified: Secondary | ICD-10-CM | POA: Diagnosis not present

## 2024-05-05 DIAGNOSIS — F339 Major depressive disorder, recurrent, unspecified: Secondary | ICD-10-CM | POA: Diagnosis not present

## 2024-05-05 DIAGNOSIS — F411 Generalized anxiety disorder: Secondary | ICD-10-CM | POA: Diagnosis not present

## 2024-05-06 DIAGNOSIS — F411 Generalized anxiety disorder: Secondary | ICD-10-CM | POA: Diagnosis not present

## 2024-05-06 DIAGNOSIS — F339 Major depressive disorder, recurrent, unspecified: Secondary | ICD-10-CM | POA: Diagnosis not present

## 2024-05-06 DIAGNOSIS — F4310 Post-traumatic stress disorder, unspecified: Secondary | ICD-10-CM | POA: Diagnosis not present

## 2024-05-07 DIAGNOSIS — F4310 Post-traumatic stress disorder, unspecified: Secondary | ICD-10-CM | POA: Diagnosis not present

## 2024-05-07 DIAGNOSIS — F411 Generalized anxiety disorder: Secondary | ICD-10-CM | POA: Diagnosis not present

## 2024-05-07 DIAGNOSIS — F339 Major depressive disorder, recurrent, unspecified: Secondary | ICD-10-CM | POA: Diagnosis not present

## 2024-05-12 DIAGNOSIS — F4310 Post-traumatic stress disorder, unspecified: Secondary | ICD-10-CM | POA: Diagnosis not present

## 2024-05-12 DIAGNOSIS — F411 Generalized anxiety disorder: Secondary | ICD-10-CM | POA: Diagnosis not present

## 2024-05-12 DIAGNOSIS — F339 Major depressive disorder, recurrent, unspecified: Secondary | ICD-10-CM | POA: Diagnosis not present

## 2024-05-13 DIAGNOSIS — F411 Generalized anxiety disorder: Secondary | ICD-10-CM | POA: Diagnosis not present

## 2024-05-13 DIAGNOSIS — F4310 Post-traumatic stress disorder, unspecified: Secondary | ICD-10-CM | POA: Diagnosis not present

## 2024-05-13 DIAGNOSIS — F339 Major depressive disorder, recurrent, unspecified: Secondary | ICD-10-CM | POA: Diagnosis not present

## 2024-05-14 DIAGNOSIS — F4310 Post-traumatic stress disorder, unspecified: Secondary | ICD-10-CM | POA: Diagnosis not present

## 2024-05-14 DIAGNOSIS — F411 Generalized anxiety disorder: Secondary | ICD-10-CM | POA: Diagnosis not present

## 2024-05-14 DIAGNOSIS — F339 Major depressive disorder, recurrent, unspecified: Secondary | ICD-10-CM | POA: Diagnosis not present

## 2024-05-19 DIAGNOSIS — F339 Major depressive disorder, recurrent, unspecified: Secondary | ICD-10-CM | POA: Diagnosis not present

## 2024-05-19 DIAGNOSIS — F411 Generalized anxiety disorder: Secondary | ICD-10-CM | POA: Diagnosis not present

## 2024-05-19 DIAGNOSIS — F4310 Post-traumatic stress disorder, unspecified: Secondary | ICD-10-CM | POA: Diagnosis not present

## 2024-05-20 DIAGNOSIS — F411 Generalized anxiety disorder: Secondary | ICD-10-CM | POA: Diagnosis not present

## 2024-05-20 DIAGNOSIS — F4310 Post-traumatic stress disorder, unspecified: Secondary | ICD-10-CM | POA: Diagnosis not present

## 2024-05-20 DIAGNOSIS — F339 Major depressive disorder, recurrent, unspecified: Secondary | ICD-10-CM | POA: Diagnosis not present

## 2024-05-21 DIAGNOSIS — F4310 Post-traumatic stress disorder, unspecified: Secondary | ICD-10-CM | POA: Diagnosis not present

## 2024-05-21 DIAGNOSIS — F411 Generalized anxiety disorder: Secondary | ICD-10-CM | POA: Diagnosis not present

## 2024-05-21 DIAGNOSIS — F339 Major depressive disorder, recurrent, unspecified: Secondary | ICD-10-CM | POA: Diagnosis not present

## 2024-05-26 DIAGNOSIS — F411 Generalized anxiety disorder: Secondary | ICD-10-CM | POA: Diagnosis not present

## 2024-05-26 DIAGNOSIS — F339 Major depressive disorder, recurrent, unspecified: Secondary | ICD-10-CM | POA: Diagnosis not present

## 2024-05-26 DIAGNOSIS — F4310 Post-traumatic stress disorder, unspecified: Secondary | ICD-10-CM | POA: Diagnosis not present

## 2024-05-27 DIAGNOSIS — F4310 Post-traumatic stress disorder, unspecified: Secondary | ICD-10-CM | POA: Diagnosis not present

## 2024-05-27 DIAGNOSIS — F339 Major depressive disorder, recurrent, unspecified: Secondary | ICD-10-CM | POA: Diagnosis not present

## 2024-05-27 DIAGNOSIS — F411 Generalized anxiety disorder: Secondary | ICD-10-CM | POA: Diagnosis not present

## 2024-05-28 DIAGNOSIS — F339 Major depressive disorder, recurrent, unspecified: Secondary | ICD-10-CM | POA: Diagnosis not present

## 2024-05-28 DIAGNOSIS — F411 Generalized anxiety disorder: Secondary | ICD-10-CM | POA: Diagnosis not present

## 2024-05-28 DIAGNOSIS — F4310 Post-traumatic stress disorder, unspecified: Secondary | ICD-10-CM | POA: Diagnosis not present

## 2024-06-02 DIAGNOSIS — F411 Generalized anxiety disorder: Secondary | ICD-10-CM | POA: Diagnosis not present

## 2024-06-02 DIAGNOSIS — F339 Major depressive disorder, recurrent, unspecified: Secondary | ICD-10-CM | POA: Diagnosis not present

## 2024-06-02 DIAGNOSIS — F4310 Post-traumatic stress disorder, unspecified: Secondary | ICD-10-CM | POA: Diagnosis not present

## 2024-06-03 DIAGNOSIS — F339 Major depressive disorder, recurrent, unspecified: Secondary | ICD-10-CM | POA: Diagnosis not present

## 2024-06-03 DIAGNOSIS — F411 Generalized anxiety disorder: Secondary | ICD-10-CM | POA: Diagnosis not present

## 2024-06-03 DIAGNOSIS — F4310 Post-traumatic stress disorder, unspecified: Secondary | ICD-10-CM | POA: Diagnosis not present

## 2024-06-04 DIAGNOSIS — F411 Generalized anxiety disorder: Secondary | ICD-10-CM | POA: Diagnosis not present

## 2024-06-04 DIAGNOSIS — F4310 Post-traumatic stress disorder, unspecified: Secondary | ICD-10-CM | POA: Diagnosis not present

## 2024-06-04 DIAGNOSIS — F339 Major depressive disorder, recurrent, unspecified: Secondary | ICD-10-CM | POA: Diagnosis not present

## 2024-06-09 DIAGNOSIS — F411 Generalized anxiety disorder: Secondary | ICD-10-CM | POA: Diagnosis not present

## 2024-06-09 DIAGNOSIS — F4310 Post-traumatic stress disorder, unspecified: Secondary | ICD-10-CM | POA: Diagnosis not present

## 2024-06-09 DIAGNOSIS — F339 Major depressive disorder, recurrent, unspecified: Secondary | ICD-10-CM | POA: Diagnosis not present

## 2024-06-10 DIAGNOSIS — F4310 Post-traumatic stress disorder, unspecified: Secondary | ICD-10-CM | POA: Diagnosis not present

## 2024-06-10 DIAGNOSIS — F411 Generalized anxiety disorder: Secondary | ICD-10-CM | POA: Diagnosis not present

## 2024-06-10 DIAGNOSIS — F339 Major depressive disorder, recurrent, unspecified: Secondary | ICD-10-CM | POA: Diagnosis not present

## 2024-06-11 DIAGNOSIS — F339 Major depressive disorder, recurrent, unspecified: Secondary | ICD-10-CM | POA: Diagnosis not present

## 2024-06-11 DIAGNOSIS — F4310 Post-traumatic stress disorder, unspecified: Secondary | ICD-10-CM | POA: Diagnosis not present

## 2024-06-11 DIAGNOSIS — F411 Generalized anxiety disorder: Secondary | ICD-10-CM | POA: Diagnosis not present

## 2024-06-16 DIAGNOSIS — F4310 Post-traumatic stress disorder, unspecified: Secondary | ICD-10-CM | POA: Diagnosis not present

## 2024-06-16 DIAGNOSIS — F339 Major depressive disorder, recurrent, unspecified: Secondary | ICD-10-CM | POA: Diagnosis not present

## 2024-06-16 DIAGNOSIS — F411 Generalized anxiety disorder: Secondary | ICD-10-CM | POA: Diagnosis not present

## 2024-06-17 DIAGNOSIS — F411 Generalized anxiety disorder: Secondary | ICD-10-CM | POA: Diagnosis not present

## 2024-06-17 DIAGNOSIS — F339 Major depressive disorder, recurrent, unspecified: Secondary | ICD-10-CM | POA: Diagnosis not present

## 2024-06-17 DIAGNOSIS — F4310 Post-traumatic stress disorder, unspecified: Secondary | ICD-10-CM | POA: Diagnosis not present

## 2024-06-18 DIAGNOSIS — F4310 Post-traumatic stress disorder, unspecified: Secondary | ICD-10-CM | POA: Diagnosis not present

## 2024-06-18 DIAGNOSIS — F339 Major depressive disorder, recurrent, unspecified: Secondary | ICD-10-CM | POA: Diagnosis not present

## 2024-06-18 DIAGNOSIS — F411 Generalized anxiety disorder: Secondary | ICD-10-CM | POA: Diagnosis not present

## 2024-06-22 DIAGNOSIS — F411 Generalized anxiety disorder: Secondary | ICD-10-CM | POA: Diagnosis not present

## 2024-06-22 DIAGNOSIS — F4310 Post-traumatic stress disorder, unspecified: Secondary | ICD-10-CM | POA: Diagnosis not present

## 2024-06-22 DIAGNOSIS — F339 Major depressive disorder, recurrent, unspecified: Secondary | ICD-10-CM | POA: Diagnosis not present

## 2024-06-23 DIAGNOSIS — F4310 Post-traumatic stress disorder, unspecified: Secondary | ICD-10-CM | POA: Diagnosis not present

## 2024-06-23 DIAGNOSIS — F339 Major depressive disorder, recurrent, unspecified: Secondary | ICD-10-CM | POA: Diagnosis not present

## 2024-06-23 DIAGNOSIS — F411 Generalized anxiety disorder: Secondary | ICD-10-CM | POA: Diagnosis not present

## 2024-06-24 DIAGNOSIS — F339 Major depressive disorder, recurrent, unspecified: Secondary | ICD-10-CM | POA: Diagnosis not present

## 2024-06-24 DIAGNOSIS — F4310 Post-traumatic stress disorder, unspecified: Secondary | ICD-10-CM | POA: Diagnosis not present

## 2024-06-24 DIAGNOSIS — F411 Generalized anxiety disorder: Secondary | ICD-10-CM | POA: Diagnosis not present

## 2024-06-25 DIAGNOSIS — F4310 Post-traumatic stress disorder, unspecified: Secondary | ICD-10-CM | POA: Diagnosis not present

## 2024-06-25 DIAGNOSIS — F339 Major depressive disorder, recurrent, unspecified: Secondary | ICD-10-CM | POA: Diagnosis not present

## 2024-06-25 DIAGNOSIS — F411 Generalized anxiety disorder: Secondary | ICD-10-CM | POA: Diagnosis not present

## 2024-06-29 DIAGNOSIS — F411 Generalized anxiety disorder: Secondary | ICD-10-CM | POA: Diagnosis not present

## 2024-06-29 DIAGNOSIS — F4310 Post-traumatic stress disorder, unspecified: Secondary | ICD-10-CM | POA: Diagnosis not present

## 2024-06-29 DIAGNOSIS — F339 Major depressive disorder, recurrent, unspecified: Secondary | ICD-10-CM | POA: Diagnosis not present

## 2024-06-30 DIAGNOSIS — F411 Generalized anxiety disorder: Secondary | ICD-10-CM | POA: Diagnosis not present

## 2024-06-30 DIAGNOSIS — F339 Major depressive disorder, recurrent, unspecified: Secondary | ICD-10-CM | POA: Diagnosis not present

## 2024-06-30 DIAGNOSIS — F4310 Post-traumatic stress disorder, unspecified: Secondary | ICD-10-CM | POA: Diagnosis not present

## 2024-07-01 DIAGNOSIS — F4310 Post-traumatic stress disorder, unspecified: Secondary | ICD-10-CM | POA: Diagnosis not present

## 2024-07-01 DIAGNOSIS — F339 Major depressive disorder, recurrent, unspecified: Secondary | ICD-10-CM | POA: Diagnosis not present

## 2024-07-01 DIAGNOSIS — F411 Generalized anxiety disorder: Secondary | ICD-10-CM | POA: Diagnosis not present

## 2024-07-04 DIAGNOSIS — R350 Frequency of micturition: Secondary | ICD-10-CM | POA: Diagnosis not present

## 2024-07-04 DIAGNOSIS — R3915 Urgency of urination: Secondary | ICD-10-CM | POA: Diagnosis not present

## 2024-07-06 DIAGNOSIS — F4310 Post-traumatic stress disorder, unspecified: Secondary | ICD-10-CM | POA: Diagnosis not present

## 2024-07-06 DIAGNOSIS — F411 Generalized anxiety disorder: Secondary | ICD-10-CM | POA: Diagnosis not present

## 2024-07-06 DIAGNOSIS — F339 Major depressive disorder, recurrent, unspecified: Secondary | ICD-10-CM | POA: Diagnosis not present

## 2024-07-08 DIAGNOSIS — F339 Major depressive disorder, recurrent, unspecified: Secondary | ICD-10-CM | POA: Diagnosis not present

## 2024-07-08 DIAGNOSIS — F4310 Post-traumatic stress disorder, unspecified: Secondary | ICD-10-CM | POA: Diagnosis not present

## 2024-07-08 DIAGNOSIS — F411 Generalized anxiety disorder: Secondary | ICD-10-CM | POA: Diagnosis not present

## 2024-07-11 NOTE — Progress Notes (Unsigned)
 PCP:  Sowles, Krichna, MD   No chief complaint on file.    HPI:      Lisa House is a 33 y.o. [phone] who LMP was No LMP recorded. (Menstrual status: IUD)., presents today for her annual examination.  Her menses are infrequent spotting with IUD. Dysmenorrhea none.   Sex activity: single partner, contraception - IUD. Mirena  placed 06/25/18; IUD strings not visible in past but IUD in correct location per 07/05/18 u/s and 08/15/19 u/s.   Noted to have LTO hemorrhagic cyst 8/19 on u/s at our office;  10/20 GYN u/s showed resolution, but then had 3.5 RTO complex cyst. U/S f/u due in 8-12 wks, no f/u u/s done after order placed.  Last Pap: 05/12/22 Results were: no abnormalities /neg HPV DNA Hx of STDs: HSV, treats with valtrex  prn sx; sx every few months. Needs Rx RF.  There is no FH of breast cancer. There is no FH of ovarian cancer. The patient does do self-breast exams.  Tobacco use: The patient denies current or previous tobacco use. Alcohol use: social drinker No drug use.  Exercise: moderately active  She does get adequate calcium but not Vitamin D in her diet.   Past Medical History:  Diagnosis Date   Allergy    Depression    Eczema    GERD (gastroesophageal reflux disease)    Intertrigo    Obesity    OCD (obsessive compulsive disorder)    Panic attack     Past Surgical History:  Procedure Laterality Date   CHOLECYSTECTOMY  01/09/2011   Dr. Wonda    Family History  Problem Relation Age of Onset   Hypertension Mother    Diabetes Mother    Headache Mother    Stroke Mother    Hypertension Father    Lung cancer Father    Asthma Father    Heart disease Son        Aortic Valve Disorder Stenosis-had balloon valvuloplasty, on meds   Ovarian cancer Maternal Grandmother    Colon cancer Maternal Grandfather    Cleft palate Son     Social History   Socioeconomic History   Marital status: Single    Spouse name: Not on file   Number of children: 3    Years of education: high school   Highest education level: High school graduate  Occupational History   Occupation: Geneticist, molecular     Employer: [employer]  Tobacco Use   Smoking status: Former    Current packs/day: 0.00    Types: Cigarettes    Quit date: 10/10/2021    Years since quitting: 2.7   Smokeless tobacco: Never  Vaping Use   Vaping status: Never Used  Substance and Sexual Activity   Alcohol use: No    Alcohol/week: 0.0 standard drinks of alcohol   Drug use: No   Sexual activity: Yes    Partners: Male    Birth control/protection: I.U.D.    Comment: Mirena   Other Topics Concern   Not on file  Social History Narrative   Not on file   Social Drivers of Health   Financial Resource Strain: Medium Risk (07/02/2018)   Overall Financial Resource Strain (CARDIA)    Difficulty of Paying Living Expenses: Somewhat hard  Food Insecurity: No Food Insecurity (07/02/2018)   Hunger Vital Sign    Worried About Running Out of Food in the Last Year: Never true    Ran Out of Food in the Last Year: Never true  Transportation Needs: No Transportation Needs (07/02/2018)   PRAPARE - Administrator, Civil Service (Medical): No    Lack of Transportation (Non-Medical): No  Physical Activity: Sufficiently Active (07/02/2018)   Exercise Vital Sign    Days of Exercise per Week: 7 days    Minutes of Exercise per Session: 30 min  Stress: No Stress Concern Present (07/02/2018)   [name] of Occupational Health - Occupational Stress Questionnaire    Feeling of Stress : Not at all  Social Connections: Somewhat Isolated (07/02/2018)   Social Connection and Isolation Panel    Frequency of Communication with Friends and Family: More than three times a week    Frequency of Social Gatherings with Friends and Family: More than three times a week    Attends Religious Services: More than 4 times per year    Active Member of [employer] or Organizations: No    Attends Tax inspector Meetings: Never    Marital Status: Never married  Intimate Partner Violence: Not At Risk (04/09/2021)   Humiliation, Afraid, Rape, and Kick questionnaire    Fear of Current or Ex-Partner: No    Emotionally Abused: No    Physically Abused: No    Sexually Abused: No     Current Outpatient Medications:    albuterol  (VENTOLIN  HFA) 108 (90 Base) MCG/ACT inhaler, Inhale 2 puffs into the lungs every 6 (six) hours as needed for wheezing or shortness of breath., Disp: 8 g, Rfl: 2   levonorgestrel  (MIRENA , 52 MG,) 20 MCG/24HR IUD, 1 Intra Uterine Device (1 each total) by Intrauterine route once for 1 dose., Disp: 1 each, Rfl: 0   omeprazole  (PRILOSEC) 40 MG capsule, Take 1 capsule (40 mg total) by mouth daily. (Patient not taking: Reported on 05/12/2022), Disp: 90 capsule, Rfl: 0   ondansetron  (ZOFRAN  ODT) 4 MG disintegrating tablet, Take 1 tablet (4 mg total) by mouth every 8 (eight) hours as needed for nausea or vomiting. (Patient not taking: Reported on 05/12/2022), Disp: 20 tablet, Rfl: 0   ondansetron  (ZOFRAN ) 4 MG tablet, Take 1 tablet (4 mg total) by mouth every 8 (eight) hours as needed for nausea or vomiting. (Patient not taking: Reported on 05/12/2022), Disp: 12 tablet, Rfl: 0   pimecrolimus  (ELIDEL ) 1 % cream, APPLY EXTERNALLY TO THE AFFECTED AREA TWICE DAILY, Disp: 100 g, Rfl: 0   triamcinolone  cream (KENALOG ) 0.1 %, Apply topically 2 (two) times daily., Disp: 453.6 g, Rfl: 0   valACYclovir  (VALTREX ) 500 MG tablet, Take 1 tablet (500 mg total) by mouth 2 (two) times daily as needed. Prn outbreak x 3 days, Disp: 30 tablet, Rfl: 1     ROS:  Review of Systems  Constitutional:  Positive for fatigue. Negative for fever and unexpected weight change.  Respiratory:  Negative for cough, shortness of breath and wheezing.   Cardiovascular:  Negative for chest pain, palpitations and leg swelling.  Gastrointestinal:  Positive for constipation and diarrhea. Negative for blood in stool, nausea  and vomiting.  Endocrine: Negative for cold intolerance, heat intolerance and polyuria.  Genitourinary:  Positive for dyspareunia. Negative for dysuria, flank pain, frequency, genital sores, hematuria, menstrual problem, pelvic pain, urgency, vaginal bleeding, vaginal discharge and vaginal pain.  Musculoskeletal:  Positive for back pain. Negative for arthralgias, joint swelling and myalgias.  Skin:  Negative for rash.  Neurological:  Negative for dizziness, syncope, light-headedness, numbness and headaches.  Hematological:  Negative for adenopathy.  Psychiatric/Behavioral:  Negative for agitation, confusion, sleep disturbance and suicidal  ideas. The patient is not nervous/anxious.   BREAST: No symptoms   Objective: There were no vitals taken for this visit.   Physical Exam Constitutional:      Appearance: She is well-developed.  Genitourinary:     Vulva normal.     No vaginal discharge, erythema or tenderness.      Right Adnexa: not tender and no mass present.    Left Adnexa: not tender and no mass present.    No cervical polyp.     IUD strings visualized.     Uterus is not enlarged or tender.  Breasts:    Right: No mass, nipple discharge, skin change or tenderness.     Left: No mass, nipple discharge, skin change or tenderness.  Neck:     Thyroid : No thyromegaly.  Cardiovascular:     Rate and Rhythm: Normal rate and regular rhythm.     Heart sounds: Normal heart sounds. No murmur heard. Pulmonary:     Effort: Pulmonary effort is normal.     Breath sounds: Normal breath sounds.  Abdominal:     Palpations: Abdomen is soft.     Tenderness: There is no abdominal tenderness. There is no guarding.  Musculoskeletal:        General: Normal range of motion.     Cervical back: Normal range of motion.  Neurological:     General: No focal deficit present.     Mental Status: She is alert and oriented to person, place, and time.     Cranial Nerves: No cranial nerve deficit.   Skin:    General: Skin is warm and dry.  Psychiatric:        Mood and Affect: Mood normal.        Behavior: Behavior normal.        Thought Content: Thought content normal.        Judgment: Judgment normal.  Vitals reviewed.     Assessment/Plan: Encounter for annual routine gynecological examination  Encounter for routine checking of intrauterine contraceptive device (IUD)--IUD strings in cx os, in correct location on u/s yesterday.  Pelvic pain--Neg exam. RTO cyst on u/s. Could be coincidental. No bladder sx. Question MSK given degenerative back issues. LBP stretch/core exercises/wt loss.  Right ovarian cyst - Plan: US  PELVIS TRANSVAGINAL NON-OB (TV ONLY)--repeat u/s in 8-12 wks.   Genital herpes simplex, unspecified site - Plan: valACYclovir  (VALTREX ) 500 MG tablet; Rx RF valtrex .  Needs flu shot - Plan: Flu Vaccine QUAD 36+ mos IM (Fluarix, Quad PF)  No orders of the defined types were placed in this encounter.            GYN counsel adequate intake of calcium and vitamin D, diet and exercise     F/U  No follow-ups on file.  Hanah Moultry B. Lisanne Ponce, PA-C 07/11/2024 5:40 PM

## 2024-07-12 ENCOUNTER — Encounter: Payer: Self-pay | Admitting: Obstetrics and Gynecology

## 2024-07-12 ENCOUNTER — Ambulatory Visit (INDEPENDENT_AMBULATORY_CARE_PROVIDER_SITE_OTHER): Admitting: Obstetrics and Gynecology

## 2024-07-12 VITALS — BP 105/70 | HR 68 | Ht 65.0 in | Wt 210.0 lb

## 2024-07-12 DIAGNOSIS — Z01419 Encounter for gynecological examination (general) (routine) without abnormal findings: Secondary | ICD-10-CM

## 2024-07-12 DIAGNOSIS — N83201 Unspecified ovarian cyst, right side: Secondary | ICD-10-CM

## 2024-07-12 DIAGNOSIS — R399 Unspecified symptoms and signs involving the genitourinary system: Secondary | ICD-10-CM | POA: Diagnosis not present

## 2024-07-12 DIAGNOSIS — A6000 Herpesviral infection of urogenital system, unspecified: Secondary | ICD-10-CM | POA: Diagnosis not present

## 2024-07-12 DIAGNOSIS — Z01411 Encounter for gynecological examination (general) (routine) with abnormal findings: Secondary | ICD-10-CM

## 2024-07-12 DIAGNOSIS — Z30431 Encounter for routine checking of intrauterine contraceptive device: Secondary | ICD-10-CM

## 2024-07-12 LAB — POCT URINALYSIS DIPSTICK
Bilirubin, UA: NEGATIVE
Glucose, UA: NEGATIVE
Ketones, UA: NEGATIVE
Leukocytes, UA: NEGATIVE
Nitrite, UA: NEGATIVE
Protein, UA: NEGATIVE
Spec Grav, UA: 1.015 (ref 1.010–1.025)
pH, UA: 5 (ref 5.0–8.0)

## 2024-07-12 MED ORDER — VALACYCLOVIR HCL 500 MG PO TABS: 500.0000 mg | ORAL_TABLET | Freq: Two times a day (BID) | ORAL | 1 refills | Status: AC | PRN

## 2024-07-12 NOTE — Patient Instructions (Addendum)
I value your feedback and you entrusting [place] with your care. If you get a [place] patient survey, I would appreciate you taking the time to let us know about your experience today. Thank you!  Centralized scheduling at [facility] [phone], option #3

## 2024-07-13 DIAGNOSIS — F4310 Post-traumatic stress disorder, unspecified: Secondary | ICD-10-CM | POA: Diagnosis not present

## 2024-07-13 DIAGNOSIS — F411 Generalized anxiety disorder: Secondary | ICD-10-CM | POA: Diagnosis not present

## 2024-07-13 DIAGNOSIS — F339 Major depressive disorder, recurrent, unspecified: Secondary | ICD-10-CM | POA: Diagnosis not present

## 2024-07-14 ENCOUNTER — Ambulatory Visit: Payer: Self-pay | Admitting: Obstetrics and Gynecology

## 2024-07-14 DIAGNOSIS — F411 Generalized anxiety disorder: Secondary | ICD-10-CM | POA: Diagnosis not present

## 2024-07-14 DIAGNOSIS — F4310 Post-traumatic stress disorder, unspecified: Secondary | ICD-10-CM | POA: Diagnosis not present

## 2024-07-14 DIAGNOSIS — F339 Major depressive disorder, recurrent, unspecified: Secondary | ICD-10-CM | POA: Diagnosis not present

## 2024-07-14 LAB — URINE CULTURE

## 2024-07-20 DIAGNOSIS — F4310 Post-traumatic stress disorder, unspecified: Secondary | ICD-10-CM | POA: Diagnosis not present

## 2024-07-20 DIAGNOSIS — F339 Major depressive disorder, recurrent, unspecified: Secondary | ICD-10-CM | POA: Diagnosis not present

## 2024-07-20 DIAGNOSIS — F411 Generalized anxiety disorder: Secondary | ICD-10-CM | POA: Diagnosis not present

## 2024-07-22 DIAGNOSIS — F411 Generalized anxiety disorder: Secondary | ICD-10-CM | POA: Diagnosis not present

## 2024-07-22 DIAGNOSIS — F339 Major depressive disorder, recurrent, unspecified: Secondary | ICD-10-CM | POA: Diagnosis not present

## 2024-07-22 DIAGNOSIS — F4310 Post-traumatic stress disorder, unspecified: Secondary | ICD-10-CM | POA: Diagnosis not present

## 2024-07-23 DIAGNOSIS — F411 Generalized anxiety disorder: Secondary | ICD-10-CM | POA: Diagnosis not present

## 2024-07-23 DIAGNOSIS — F4310 Post-traumatic stress disorder, unspecified: Secondary | ICD-10-CM | POA: Diagnosis not present

## 2024-07-23 DIAGNOSIS — F339 Major depressive disorder, recurrent, unspecified: Secondary | ICD-10-CM | POA: Diagnosis not present

## 2024-07-27 DIAGNOSIS — F411 Generalized anxiety disorder: Secondary | ICD-10-CM | POA: Diagnosis not present

## 2024-07-27 DIAGNOSIS — F339 Major depressive disorder, recurrent, unspecified: Secondary | ICD-10-CM | POA: Diagnosis not present

## 2024-07-27 DIAGNOSIS — F4310 Post-traumatic stress disorder, unspecified: Secondary | ICD-10-CM | POA: Diagnosis not present

## 2024-07-29 DIAGNOSIS — F411 Generalized anxiety disorder: Secondary | ICD-10-CM | POA: Diagnosis not present

## 2024-07-29 DIAGNOSIS — F4310 Post-traumatic stress disorder, unspecified: Secondary | ICD-10-CM | POA: Diagnosis not present

## 2024-07-29 DIAGNOSIS — F339 Major depressive disorder, recurrent, unspecified: Secondary | ICD-10-CM | POA: Diagnosis not present

## 2024-08-03 DIAGNOSIS — F339 Major depressive disorder, recurrent, unspecified: Secondary | ICD-10-CM | POA: Diagnosis not present

## 2024-08-03 DIAGNOSIS — F411 Generalized anxiety disorder: Secondary | ICD-10-CM | POA: Diagnosis not present

## 2024-08-03 DIAGNOSIS — F4310 Post-traumatic stress disorder, unspecified: Secondary | ICD-10-CM | POA: Diagnosis not present

## 2024-08-05 DIAGNOSIS — F339 Major depressive disorder, recurrent, unspecified: Secondary | ICD-10-CM | POA: Diagnosis not present

## 2024-08-05 DIAGNOSIS — F4310 Post-traumatic stress disorder, unspecified: Secondary | ICD-10-CM | POA: Diagnosis not present

## 2024-08-05 DIAGNOSIS — F411 Generalized anxiety disorder: Secondary | ICD-10-CM | POA: Diagnosis not present

## 2024-08-10 ENCOUNTER — Other Ambulatory Visit

## 2024-08-10 ENCOUNTER — Telehealth: Payer: Self-pay | Admitting: Obstetrics and Gynecology

## 2024-08-10 DIAGNOSIS — F339 Major depressive disorder, recurrent, unspecified: Secondary | ICD-10-CM | POA: Diagnosis not present

## 2024-08-10 DIAGNOSIS — F411 Generalized anxiety disorder: Secondary | ICD-10-CM | POA: Diagnosis not present

## 2024-08-10 DIAGNOSIS — F4310 Post-traumatic stress disorder, unspecified: Secondary | ICD-10-CM | POA: Diagnosis not present

## 2024-08-10 NOTE — Telephone Encounter (Signed)
 Reached out to pt about gyn US  that was scheduled on 08/10/2024 at 11:15.  Left message for pt to call back about the US .

## 2024-08-11 ENCOUNTER — Encounter: Payer: Self-pay | Admitting: Obstetrics and Gynecology

## 2024-08-11 DIAGNOSIS — F411 Generalized anxiety disorder: Secondary | ICD-10-CM | POA: Diagnosis not present

## 2024-08-11 DIAGNOSIS — F339 Major depressive disorder, recurrent, unspecified: Secondary | ICD-10-CM | POA: Diagnosis not present

## 2024-08-11 DIAGNOSIS — F4310 Post-traumatic stress disorder, unspecified: Secondary | ICD-10-CM | POA: Diagnosis not present

## 2024-08-11 NOTE — Telephone Encounter (Signed)
 Reached out to pt (2x) about gyn US  that was scheduled on 08/10/2024 at 11:15.  Left message for pt to call back about the US .  Will send a MyChart letter to pt.

## 2024-08-12 DIAGNOSIS — F339 Major depressive disorder, recurrent, unspecified: Secondary | ICD-10-CM | POA: Diagnosis not present

## 2024-08-12 DIAGNOSIS — F4310 Post-traumatic stress disorder, unspecified: Secondary | ICD-10-CM | POA: Diagnosis not present

## 2024-08-12 DIAGNOSIS — F411 Generalized anxiety disorder: Secondary | ICD-10-CM | POA: Diagnosis not present

## 2024-08-18 DIAGNOSIS — F4310 Post-traumatic stress disorder, unspecified: Secondary | ICD-10-CM | POA: Diagnosis not present

## 2024-08-18 DIAGNOSIS — F411 Generalized anxiety disorder: Secondary | ICD-10-CM | POA: Diagnosis not present

## 2024-08-18 DIAGNOSIS — F339 Major depressive disorder, recurrent, unspecified: Secondary | ICD-10-CM | POA: Diagnosis not present

## 2024-08-20 DIAGNOSIS — F411 Generalized anxiety disorder: Secondary | ICD-10-CM | POA: Diagnosis not present

## 2024-08-20 DIAGNOSIS — F339 Major depressive disorder, recurrent, unspecified: Secondary | ICD-10-CM | POA: Diagnosis not present

## 2024-08-20 DIAGNOSIS — F4310 Post-traumatic stress disorder, unspecified: Secondary | ICD-10-CM | POA: Diagnosis not present

## 2024-08-25 DIAGNOSIS — F4310 Post-traumatic stress disorder, unspecified: Secondary | ICD-10-CM | POA: Diagnosis not present

## 2024-08-25 DIAGNOSIS — F411 Generalized anxiety disorder: Secondary | ICD-10-CM | POA: Diagnosis not present

## 2024-08-25 DIAGNOSIS — F339 Major depressive disorder, recurrent, unspecified: Secondary | ICD-10-CM | POA: Diagnosis not present

## 2024-08-26 DIAGNOSIS — F411 Generalized anxiety disorder: Secondary | ICD-10-CM | POA: Diagnosis not present

## 2024-08-26 DIAGNOSIS — F4310 Post-traumatic stress disorder, unspecified: Secondary | ICD-10-CM | POA: Diagnosis not present

## 2024-08-26 DIAGNOSIS — F339 Major depressive disorder, recurrent, unspecified: Secondary | ICD-10-CM | POA: Diagnosis not present

## 2024-08-27 DIAGNOSIS — F4310 Post-traumatic stress disorder, unspecified: Secondary | ICD-10-CM | POA: Diagnosis not present

## 2024-08-27 DIAGNOSIS — F411 Generalized anxiety disorder: Secondary | ICD-10-CM | POA: Diagnosis not present

## 2024-08-27 DIAGNOSIS — F339 Major depressive disorder, recurrent, unspecified: Secondary | ICD-10-CM | POA: Diagnosis not present

## 2024-08-28 NOTE — Progress Notes (Unsigned)
   No chief complaint on file.    History of Present Illness:  Lisa House is a 33 y.o. that had a {IUD:23561} IUD placed approximately {NUMBERS:20191} {MONTH/YR:310907} ago. Since that time, she denies dyspareunia, pelvic pain, non-menstrual bleeding, vaginal d/c, heavy bleeding.    There were no vitals taken for this visit.  Pelvic exam:  Two IUD strings {DESC; PRESENT/ABSENT:17923} seen coming from the cervical os. EGBUS, vaginal vault and cervix: within normal limits  IUD Removal Strings of IUD identified and grasped.  IUD removed without problem with ring forceps.  Pt tolerated this well.  IUD noted to be intact.  Assessment:  No diagnosis found.    No chief complaint on file.    IUD PROCEDURE NOTE:  Lisa House is a 33 y.o. [phone] here for {type:23561}  IUD insertion for ***   There were no vitals taken for this visit.  IUD Insertion Procedure Note Patient identified, informed consent performed, consent signed.   Discussed risks of irregular bleeding, cramping, infection, malpositioning or misplacement of the IUD outside the uterus which may require further procedure such as laparoscopy, risk of failure <1%. Time out was performed.  Urine pregnancy test negative.***  Speculum placed in the vagina.  Cervix visualized.  Cleaned with Betadine x 2.  Grasped anteriorly with a single tooth tenaculum.  Uterus sounded to *** cm.   IUD placed per manufacturer's recommendations.  Strings trimmed to 3 cm. Tenaculum was removed, good hemostasis noted.  Patient tolerated procedure well.   ASSESSMENT:  No diagnosis found.   No orders of the defined types were placed in this encounter.    Plan:  Patient was given post-procedure instructions.  She was advised to have backup contraception for one week.   Call if you are having increasing pain, cramps or bleeding or if you have a fever greater than 100.4 degrees F., shaking chills, nausea or vomiting. Patient was  also asked to check IUD strings periodically and follow up in 4 weeks for IUD check.  No follow-ups on file.  Markus Casten B. Lawson Mahone, PA-C 08/28/2024 7:52 PM      Plan: IUD removed and plan for contraception is {PLAN CONTRACEPTION:313102}. She was amenable to this plan.  Tilak Oakley B. Matson Welch, PA-C 08/28/2024 7:51 PM

## 2024-08-29 ENCOUNTER — Ambulatory Visit: Admitting: Obstetrics and Gynecology

## 2024-08-29 ENCOUNTER — Encounter: Payer: Self-pay | Admitting: Obstetrics and Gynecology

## 2024-08-29 VITALS — BP 98/65 | HR 73 | Ht 65.0 in | Wt 206.0 lb

## 2024-08-29 DIAGNOSIS — Z30433 Encounter for removal and reinsertion of intrauterine contraceptive device: Secondary | ICD-10-CM

## 2024-08-29 MED ORDER — LEVONORGESTREL 20 MCG/DAY IU IUD
1.0000 | INTRAUTERINE_SYSTEM | Freq: Once | INTRAUTERINE | Status: AC
  Administered 2024-08-29: 1 via INTRAUTERINE

## 2024-08-29 NOTE — Patient Instructions (Signed)
 I value your feedback and you entrusting [place] with your care. If you get a  patient survey, I would appreciate you taking the time to let us know about your experience today. Thank you!  Instructions after IUD insertion  Most women experience no significant problems after insertion of an IUD, however minor cramping and spotting for a few days is common. Cramps may be treated with ibuprofen 800mg  every 8 hours or Tylenol 650 mg every 4 hours. Contact [facility] immediately if you experience any of the following symptoms during the next week: temperature >99.6 degrees, worsening pelvic pain, abdominal pain, fainting, unusually heavy vaginal bleeding, foul vaginal discharge, or if you think you have expelled the IUD. Nothing inserted in the vagina for 48 hours. You will be scheduled for a follow up visit in approximately four weeks.

## 2024-09-01 DIAGNOSIS — F4310 Post-traumatic stress disorder, unspecified: Secondary | ICD-10-CM | POA: Diagnosis not present

## 2024-09-01 DIAGNOSIS — F411 Generalized anxiety disorder: Secondary | ICD-10-CM | POA: Diagnosis not present

## 2024-09-01 DIAGNOSIS — F339 Major depressive disorder, recurrent, unspecified: Secondary | ICD-10-CM | POA: Diagnosis not present

## 2024-09-03 DIAGNOSIS — F411 Generalized anxiety disorder: Secondary | ICD-10-CM | POA: Diagnosis not present

## 2024-09-03 DIAGNOSIS — F339 Major depressive disorder, recurrent, unspecified: Secondary | ICD-10-CM | POA: Diagnosis not present

## 2024-09-03 DIAGNOSIS — F4310 Post-traumatic stress disorder, unspecified: Secondary | ICD-10-CM | POA: Diagnosis not present

## 2024-09-08 DIAGNOSIS — F339 Major depressive disorder, recurrent, unspecified: Secondary | ICD-10-CM | POA: Diagnosis not present

## 2024-09-08 DIAGNOSIS — F4310 Post-traumatic stress disorder, unspecified: Secondary | ICD-10-CM | POA: Diagnosis not present

## 2024-09-08 DIAGNOSIS — F411 Generalized anxiety disorder: Secondary | ICD-10-CM | POA: Diagnosis not present

## 2024-09-10 DIAGNOSIS — F411 Generalized anxiety disorder: Secondary | ICD-10-CM | POA: Diagnosis not present

## 2024-09-10 DIAGNOSIS — F339 Major depressive disorder, recurrent, unspecified: Secondary | ICD-10-CM | POA: Diagnosis not present

## 2024-09-10 DIAGNOSIS — F4310 Post-traumatic stress disorder, unspecified: Secondary | ICD-10-CM | POA: Diagnosis not present

## 2024-09-14 DIAGNOSIS — F339 Major depressive disorder, recurrent, unspecified: Secondary | ICD-10-CM | POA: Diagnosis not present

## 2024-09-14 DIAGNOSIS — F411 Generalized anxiety disorder: Secondary | ICD-10-CM | POA: Diagnosis not present

## 2024-09-14 DIAGNOSIS — F4310 Post-traumatic stress disorder, unspecified: Secondary | ICD-10-CM | POA: Diagnosis not present

## 2024-09-16 DIAGNOSIS — F339 Major depressive disorder, recurrent, unspecified: Secondary | ICD-10-CM | POA: Diagnosis not present

## 2024-09-16 DIAGNOSIS — F411 Generalized anxiety disorder: Secondary | ICD-10-CM | POA: Diagnosis not present

## 2024-09-16 DIAGNOSIS — F4310 Post-traumatic stress disorder, unspecified: Secondary | ICD-10-CM | POA: Diagnosis not present

## 2024-09-21 DIAGNOSIS — F4310 Post-traumatic stress disorder, unspecified: Secondary | ICD-10-CM | POA: Diagnosis not present

## 2024-09-21 DIAGNOSIS — F411 Generalized anxiety disorder: Secondary | ICD-10-CM | POA: Diagnosis not present

## 2024-09-21 DIAGNOSIS — F339 Major depressive disorder, recurrent, unspecified: Secondary | ICD-10-CM | POA: Diagnosis not present

## 2024-09-23 DIAGNOSIS — F4310 Post-traumatic stress disorder, unspecified: Secondary | ICD-10-CM | POA: Diagnosis not present

## 2024-09-23 DIAGNOSIS — F411 Generalized anxiety disorder: Secondary | ICD-10-CM | POA: Diagnosis not present

## 2024-09-23 DIAGNOSIS — F339 Major depressive disorder, recurrent, unspecified: Secondary | ICD-10-CM | POA: Diagnosis not present

## 2024-09-25 NOTE — Progress Notes (Deleted)
   No chief complaint on file.    History of Present Illness:  Lisa House is a 33 y.o. that had a Mirena  IUD placed approximately 1 month ago. Since that time, she denies dyspareunia, pelvic pain, non-menstrual bleeding, vaginal d/c, heavy bleeding.   Review of Systems  Physical Exam:  LMP 08/29/2024 (Exact Date)  There is no height or weight on file to calculate BMI.  Pelvic exam:  Two IUD strings {DESC; PRESENT/ABSENT:17923} seen coming from the cervical os. EGBUS, vaginal vault and cervix: within normal limits   Assessment:   No diagnosis found.  IUD strings present in proper location; pt doing well  Plan: F/u if any signs of infection or can no longer feel the strings.   Aashka Salomone B. Aleese Kamps, PA-C 09/25/2024 4:20 PM

## 2024-09-26 ENCOUNTER — Ambulatory Visit: Admitting: Obstetrics and Gynecology

## 2024-09-26 DIAGNOSIS — Z30431 Encounter for routine checking of intrauterine contraceptive device: Secondary | ICD-10-CM

## 2024-09-27 ENCOUNTER — Ambulatory Visit

## 2024-09-27 VITALS — BP 113/70 | HR 62 | Resp 16 | Ht 67.0 in | Wt 202.0 lb

## 2024-09-27 DIAGNOSIS — R0982 Postnasal drip: Secondary | ICD-10-CM | POA: Diagnosis not present

## 2024-09-27 DIAGNOSIS — Z136 Encounter for screening for cardiovascular disorders: Secondary | ICD-10-CM | POA: Diagnosis not present

## 2024-09-27 DIAGNOSIS — Z862 Personal history of diseases of the blood and blood-forming organs and certain disorders involving the immune mechanism: Secondary | ICD-10-CM | POA: Diagnosis not present

## 2024-09-27 DIAGNOSIS — K219 Gastro-esophageal reflux disease without esophagitis: Secondary | ICD-10-CM | POA: Diagnosis not present

## 2024-09-27 DIAGNOSIS — Z1159 Encounter for screening for other viral diseases: Secondary | ICD-10-CM | POA: Diagnosis not present

## 2024-09-27 DIAGNOSIS — Z131 Encounter for screening for diabetes mellitus: Secondary | ICD-10-CM

## 2024-09-27 DIAGNOSIS — R5383 Other fatigue: Secondary | ICD-10-CM | POA: Insufficient documentation

## 2024-09-27 DIAGNOSIS — L409 Psoriasis, unspecified: Secondary | ICD-10-CM | POA: Diagnosis not present

## 2024-09-27 MED ORDER — FLUTICASONE PROPIONATE 50 MCG/ACT NA SUSP: 1.0000 | Freq: Every day | NASAL | 6 refills | Status: AC

## 2024-09-27 MED ORDER — PIMECROLIMUS 1 % EX CREA: TOPICAL_CREAM | CUTANEOUS | 3 refills | Status: AC

## 2024-09-27 MED ORDER — TRIAMCINOLONE ACETONIDE 0.1 % EX CREA: TOPICAL_CREAM | Freq: Two times a day (BID) | CUTANEOUS | 3 refills | Status: AC

## 2024-09-27 MED ORDER — FAMOTIDINE 20 MG PO TABS: 20.0000 mg | ORAL_TABLET | Freq: Two times a day (BID) | ORAL | 3 refills | Status: AC | PRN

## 2024-09-27 NOTE — Progress Notes (Signed)
 New patient visit   Patient: Lisa House   DOB: 19-Jun-1991   34 y.o. Female  MRN: [id number] Visit Date: 09/27/2024  Today's healthcare provider: Isaiah DELENA Pepper, MD   Chief Complaint  Patient presents with   Transfer Of Care    TOC/ Est Care   Subjective    Lisa House is a 33 y.o. female who presents today as a new patient to establish care.   Discussed the use of AI scribe software for clinical note transcription with the patient, who gave verbal consent to proceed.  History of Present Illness Lisa House is a 33 year old female who presents with fatigue and to establish care.  She experiences chronic fatigue and recent hair loss, which she fears may be related to cancer. She has a history of donating plasma twice a week for about a year, which she stopped recently. During plasma donations, she was informed of low protein levels and occasional low blood counts, which sometimes prevented her from donating. She experiences dizziness upon standing quickly. She has not had a comprehensive blood workup in a long time, except for the checks done during plasma donations. Her blood pressure readings have been low at times, with systolic readings in the nineties and diastolic readings in the fifties. She has increased her water intake recently.  She experiences drainage in the back of her throat, particularly noticeable with weather changes and worse in the mornings. She has not tried nasal sprays.  She has a persistent itchy patch on her hand, which she believes is psoriasis. She has had similar patches on her foot and ankles that have resolved. She has used creams in the past, which were effective, but she has not had any for a couple of years.  She experiences heartburn, particularly after consuming chocolate or sweets, and uses Tums for relief. She reports nausea with sweets and burning sensations, which she suspects might be acid reflux.  She has a history of herpes  simplex virus type 1, with outbreaks occurring about five times in the last fifteen years. The last outbreak was three years ago.  She restarted smoking after her son's passing in February but quit again last month. Her son, who was thirteen, passed away unexpectedly after a cardiac arrest following a history of heart surgery.   Past Medical History:  Diagnosis Date   Allergy    Depression    Eczema    GERD (gastroesophageal reflux disease)    Intertrigo    Obesity    OCD (obsessive compulsive disorder)    Panic attack    Past Surgical History:  Procedure Laterality Date   CHOLECYSTECTOMY  01/09/2011   Dr. Wonda   Family Status  Relation Name Status   Mother  Alive   Father  Deceased   MGM  Deceased   MGF  Deceased   PGM  Deceased   PGF  Deceased   Daughter Lisa House Other   Son Lisa House Alive   Son Lisa House Alive   Son Lisa House Alive  No partnership data on file   Family History  Problem Relation Age of Onset   Hypertension Mother    Diabetes Mother    Headache Mother    Stroke Mother    Hypertension Father    Lung cancer Father    Asthma Father    Ovarian cancer Maternal Grandmother        not 100% sure   Colon cancer Maternal Grandfather    Heart  disease Son        Aortic Valve Disorder Stenosis-had balloon valvuloplasty, on meds   Cleft palate Son    Social History   Socioeconomic History   Marital status: Single    Spouse name: Not on file   Number of children: 3   Years of education: high school   Highest education level: High school graduate  Occupational History   Occupation: geneticist, molecular     Employer: [employer]  Tobacco Use   Smoking status: Some Days    Current packs/day: 0.00    Types: Cigarettes    Last attempt to quit: 10/10/2021    Years since quitting: 2.9   Smokeless tobacco: Never  Vaping Use   Vaping status: Never Used  Substance and Sexual Activity   Alcohol use: No    Alcohol/week: 0.0 standard drinks of alcohol   Drug use:  No   Sexual activity: Yes    Partners: Male    Birth control/protection: I.U.D.    Comment: Mirena   Other Topics Concern   Not on file  Social History Narrative   Not on file   Social Drivers of Health   Financial Resource Strain: Medium Risk (07/02/2018)   Overall Financial Resource Strain (CARDIA)    Difficulty of Paying Living Expenses: Somewhat hard  Food Insecurity: No Food Insecurity (07/02/2018)   Hunger Vital Sign    Worried About Running Out of Food in the Last Year: Never true    Ran Out of Food in the Last Year: Never true  Transportation Needs: No Transportation Needs (07/02/2018)   PRAPARE - Administrator, Civil Service (Medical): No    Lack of Transportation (Non-Medical): No  Physical Activity: Sufficiently Active (07/02/2018)   Exercise Vital Sign    Days of Exercise per Week: 7 days    Minutes of Exercise per Session: 30 min  Stress: No Stress Concern Present (07/02/2018)   [name] of Occupational Health - Occupational Stress Questionnaire    Feeling of Stress : Not at all  Social Connections: Somewhat Isolated (07/02/2018)   Social Connection and Isolation Panel    Frequency of Communication with Friends and Family: More than three times a week    Frequency of Social Gatherings with Friends and Family: More than three times a week    Attends Religious Services: More than 4 times per year    Active Member of [employer] or Organizations: No    Attends Banker Meetings: Never    Marital Status: Never married   Outpatient Medications Prior to Visit  Medication Sig   valACYclovir  (VALTREX ) 500 MG tablet Take 1 tablet (500 mg total) by mouth 2 (two) times daily as needed. Prn outbreak x 3 days   [DISCONTINUED] pimecrolimus  (ELIDEL ) 1 % cream APPLY EXTERNALLY TO THE AFFECTED AREA TWICE DAILY   [DISCONTINUED] triamcinolone  cream (KENALOG ) 0.1 % Apply topically 2 (two) times daily.   No facility-administered medications prior to visit.    No Known Allergies  Reviews of Systems as noted in HPI.      Objective    BP 113/70 (BP Location: Left Arm, Patient Position: Sitting)   Pulse 62   Resp 16   Ht 5' 7 (1.702 m)   Wt 202 lb (91.6 kg)   LMP 08/29/2024 (Exact Date)   SpO2 98%   BMI 31.64 kg/m     Physical Exam Constitutional:      Appearance: Normal appearance.  HENT:     Head:  Normocephalic and atraumatic.     Mouth/Throat:     Mouth: Mucous membranes are moist.  Eyes:     Pupils: Pupils are equal, round, and reactive to light.  Cardiovascular:     Rate and Rhythm: Normal rate and regular rhythm.     Heart sounds: Normal heart sounds.  Pulmonary:     Effort: Pulmonary effort is normal.     Breath sounds: Normal breath sounds.  Skin:    General: Skin is warm.     Comments: Erythematous plaque with silver scale present on R wrist  Neurological:     General: No focal deficit present.     Mental Status: She is alert.     Depression Screen    09/27/2024   10:04 AM 10/02/2021   11:35 AM 06/13/2021    8:09 AM 04/09/2021   10:13 AM  PHQ 2/9 Scores  PHQ - 2 Score 2 0 0 2  PHQ- 9 Score 6 0   6      Data saved with a previous flowsheet row definition   No results found for any visits on 09/27/24.  Assessment & Plan      Problem List Items Addressed This Visit       Digestive   Gastroesophageal reflux disease   Relevant Medications   famotidine (PEPCID) 20 MG tablet     Musculoskeletal and Integument   Psoriasis - Primary   Relevant Medications   pimecrolimus  (ELIDEL ) 1 % cream   triamcinolone  cream (KENALOG ) 0.1 %   Other Relevant Orders   Ambulatory referral to Dermatology     Other   Post-nasal drip   Relevant Medications   fluticasone  (FLONASE ) 50 MCG/ACT nasal spray   Other fatigue   Relevant Orders   Comprehensive metabolic panel with GFR   CBC   Iron, TIBC and Ferritin Panel   Hemoglobin A1c   TSH   VITAMIN D 25 Hydroxy (Vit-D Deficiency, Fractures)   Other Visit  Diagnoses       Screening for diabetes mellitus (DM)       Relevant Orders   Hemoglobin A1c     Screening for cardiovascular condition       Relevant Orders   Lipid panel     Need for hepatitis C screening test       Relevant Orders   Hepatitis C antibody      Assessment & Plan Fatigue Chronic, uncontrolled fatigue with dizziness and hair loss. Ddx includes possible anemia from plasma donation, thyroid  dysfunction, electrolyte imbalance, vitamin D deficiency. - Ordered blood tests: blood counts, iron levels, thyroid  function, electrolytes, kidney function, liver function, blood sugar, vitamin D. - Advised increased hydration and low-sugar electrolyte supplements.  Psoriasis Chronic, uncontrolled. Currently has flare on R wrist. Has used pimecrolimus  and triamcinolone  in the past with improvement, but currently out. Would likely benefit from stronger topical steroid. - Refilled topical creams. - Referred to dermatology.  Postnasal drip Chronic postnasal drip with nausea and mucus drainage, worse in the morning.  - Prescribed Flonase  nasal spray, one spray in each nostril daily. - Advised proper nasal spray technique.  Gastroesophageal reflux disease Intermittent heartburn and nausea, possibly related to dietary triggers. - Prescribed Pepcid as needed, up to twice daily. - Advised dietary modifications to avoid sweets and chocolate.  General Health Maintenance Due for hepatitis C screening. No current cancer screenings needed. Pap smear due in 2028. - Ordered hepatitis C screening. - Discussed cancer screening guidelines: mammograms at age 105, colonoscopy  at age 86. - Advised Pap smear every five years if normal.    Return in about 1 year (around 09/27/2025) for Annual Physical Exam.      Lisa DELENA Pepper, MD  [facility] [phone] (phone) [phone] (fax)

## 2024-09-28 ENCOUNTER — Ambulatory Visit: Payer: Self-pay

## 2024-09-28 DIAGNOSIS — F339 Major depressive disorder, recurrent, unspecified: Secondary | ICD-10-CM | POA: Diagnosis not present

## 2024-09-28 DIAGNOSIS — F411 Generalized anxiety disorder: Secondary | ICD-10-CM | POA: Diagnosis not present

## 2024-09-28 DIAGNOSIS — F4310 Post-traumatic stress disorder, unspecified: Secondary | ICD-10-CM | POA: Diagnosis not present

## 2024-09-28 LAB — COMPREHENSIVE METABOLIC PANEL WITH GFR
ALT: 18 IU/L (ref 0–32)
AST: 23 IU/L (ref 0–40)
Albumin: 4.2 g/dL (ref 3.9–4.9)
Alkaline Phosphatase: 109 IU/L (ref 41–116)
BUN/Creatinine Ratio: 9 (ref 9–23)
BUN: 7 mg/dL (ref 6–20)
Bilirubin Total: 0.3 mg/dL (ref 0.0–1.2)
CO2: 25 mmol/L (ref 20–29)
Calcium: 9.2 mg/dL (ref 8.7–10.2)
Chloride: 99 mmol/L (ref 96–106)
Creatinine, Ser: 0.8 mg/dL (ref 0.57–1.00)
Globulin, Total: 3 g/dL (ref 1.5–4.5)
Glucose: 82 mg/dL (ref 70–99)
Potassium: 4.3 mmol/L (ref 3.5–5.2)
Sodium: 137 mmol/L (ref 134–144)
Total Protein: 7.2 g/dL (ref 6.0–8.5)
eGFR: 100 mL/min/1.73 (ref >59)

## 2024-09-28 LAB — LIPID PANEL
Chol/HDL Ratio: 2.2 ratio (ref 0.0–4.4)
Cholesterol, Total: 110 mg/dL (ref 100–199)
HDL: 51 mg/dL (ref >39)
LDL Chol Calc (NIH): 46 mg/dL (ref 0–99)
Triglycerides: 58 mg/dL (ref 0–149)
VLDL Cholesterol Cal: 13 mg/dL (ref 5–40)

## 2024-09-28 LAB — CBC
Hematocrit: 41.1 % (ref 34.0–46.6)
Hemoglobin: 13.1 g/dL (ref 11.1–15.9)
MCH: 27.5 pg (ref 26.6–33.0)
MCHC: 31.9 g/dL (ref 31.5–35.7)
MCV: 86 fL (ref 79–97)
Platelets: 215 x10E3/uL (ref 150–450)
RBC: 4.77 x10E6/uL (ref 3.77–5.28)
RDW: 13 % (ref 11.7–15.4)
WBC: 7.7 x10E3/uL (ref 3.4–10.8)

## 2024-09-28 LAB — HEMOGLOBIN A1C
Est. average glucose Bld gHb Est-mCnc: 105 mg/dL
Hgb A1c MFr Bld: 5.3 % (ref 4.8–5.6)

## 2024-09-28 LAB — TSH: TSH: 2.22 u[IU]/mL (ref 0.450–4.500)

## 2024-09-28 LAB — IRON,TIBC AND FERRITIN PANEL
Ferritin: 42 ng/mL (ref 15–150)
Iron Saturation: 16 % (ref 15–55)
Iron: 50 ug/dL (ref 27–159)
Total Iron Binding Capacity: 310 ug/dL (ref 250–450)
UIBC: 260 ug/dL (ref 131–425)

## 2024-09-28 LAB — VITAMIN D 25 HYDROXY (VIT D DEFICIENCY, FRACTURES): Vit D, 25-Hydroxy: 25.6 ng/mL — ABNORMAL LOW (ref 30.0–100.0)

## 2024-09-28 LAB — HEPATITIS C ANTIBODY: Hep C Virus Ab: NONREACTIVE

## 2024-09-28 NOTE — Progress Notes (Deleted)
   No chief complaint on file.    History of Present Illness:  Lisa House is a 33 y.o. that had a Mirena  IUD placed approximately 1 month ago. Since that time, she denies dyspareunia, pelvic pain, non-menstrual bleeding, vaginal d/c, heavy bleeding.   Review of Systems  Physical Exam:  There were no vitals taken for this visit. There is no height or weight on file to calculate BMI.  Pelvic exam:  Two IUD strings {DESC; PRESENT/ABSENT:17923} seen coming from the cervical os. EGBUS, vaginal vault and cervix: within normal limits   Assessment:   No diagnosis found.  IUD strings present in proper location; pt doing well  Plan: F/u if any signs of infection or can no longer feel the strings.   Tekoa Amon B. Chabeli Barsamian, PA-C 09/28/2024 12:56 PM

## 2024-09-29 ENCOUNTER — Telehealth: Payer: Self-pay | Admitting: Pharmacy Technician

## 2024-09-29 ENCOUNTER — Ambulatory Visit: Admitting: Obstetrics and Gynecology

## 2024-09-29 ENCOUNTER — Other Ambulatory Visit (HOSPITAL_COMMUNITY): Payer: Self-pay

## 2024-09-29 NOTE — Telephone Encounter (Signed)
 Pharmacy Patient Advocate Encounter   Received notification from Onbase that prior authorization for Pimecrolimus  1% cream is required/requested.   Insurance verification completed.   The patient is insured through [employer].   Per test claim: PA required; PA submitted to above mentioned insurance via Latent Key/confirmation #/EOC [id number] Status is pending

## 2024-09-29 NOTE — Telephone Encounter (Signed)
 Pharmacy Patient Advocate Encounter  Received notification from [facility] MEDICAID that Prior Authorization for Pimecrolimus  1% cream  has been APPROVED from 09/29/24 to 09/29/25

## 2024-09-30 DIAGNOSIS — F411 Generalized anxiety disorder: Secondary | ICD-10-CM | POA: Diagnosis not present

## 2024-09-30 DIAGNOSIS — F4310 Post-traumatic stress disorder, unspecified: Secondary | ICD-10-CM | POA: Diagnosis not present

## 2024-09-30 DIAGNOSIS — F339 Major depressive disorder, recurrent, unspecified: Secondary | ICD-10-CM | POA: Diagnosis not present

## 2024-10-04 ENCOUNTER — Emergency Department
Admission: EM | Admit: 2024-10-04 | Discharge: 2024-10-04 | Disposition: A | Discharge status: Home / Self Care | Attending: Emergency Medicine | Admitting: Emergency Medicine

## 2024-10-04 ENCOUNTER — Other Ambulatory Visit: Payer: Self-pay

## 2024-10-04 DIAGNOSIS — R112 Nausea with vomiting, unspecified: Secondary | ICD-10-CM | POA: Insufficient documentation

## 2024-10-04 DIAGNOSIS — R1013 Epigastric pain: Secondary | ICD-10-CM | POA: Insufficient documentation

## 2024-10-04 LAB — POC URINE PREG, ED: Preg Test, Ur: NEGATIVE

## 2024-10-04 LAB — COMPREHENSIVE METABOLIC PANEL WITH GFR
ALT: 21 U/L (ref 0–44)
AST: 25 U/L (ref 15–41)
Albumin: 4.2 g/dL (ref 3.5–5.0)
Alkaline Phosphatase: 102 U/L (ref 38–126)
Anion gap: 8 (ref 5–15)
BUN: 5 mg/dL — ABNORMAL LOW (ref 6–20)
CO2: 29 mmol/L (ref 22–32)
Calcium: 9.1 mg/dL (ref 8.9–10.3)
Chloride: 99 mmol/L (ref 98–111)
Creatinine, Ser: 0.85 mg/dL (ref 0.44–1.00)
GFR, Estimated: >60 mL/min (ref >60)
Glucose, Bld: 107 mg/dL — ABNORMAL HIGH (ref 70–99)
Potassium: 3.9 mmol/L (ref 3.5–5.1)
Sodium: 137 mmol/L (ref 135–145)
Total Bilirubin: 0.3 mg/dL (ref 0.0–1.2)
Total Protein: 7.5 g/dL (ref 6.5–8.1)

## 2024-10-04 LAB — URINALYSIS, ROUTINE W REFLEX MICROSCOPIC
Bilirubin Urine: NEGATIVE
Glucose, UA: NEGATIVE mg/dL
Ketones, ur: NEGATIVE mg/dL
Leukocytes,Ua: NEGATIVE
Nitrite: NEGATIVE
Protein, ur: NEGATIVE mg/dL
Specific Gravity, Urine: 1.003 — ABNORMAL LOW (ref 1.005–1.030)
WBC, UA: 0 WBC/hpf (ref 0–5)
pH: 7 (ref 5.0–8.0)

## 2024-10-04 LAB — CBC
HCT: 40.6 % (ref 36.0–46.0)
Hemoglobin: 12.9 g/dL (ref 12.0–15.0)
MCH: 27.2 pg (ref 26.0–34.0)
MCHC: 31.8 g/dL (ref 30.0–36.0)
MCV: 85.5 fL (ref 80.0–100.0)
Platelets: 201 K/uL (ref 150–400)
RBC: 4.75 MIL/uL (ref 3.87–5.11)
RDW: 13.6 % (ref 11.5–15.5)
WBC: 6.4 K/uL (ref 4.0–10.5)
nRBC: 0 % (ref 0.0–0.2)

## 2024-10-04 LAB — LIPASE, BLOOD: Lipase: 21 U/L (ref 11–51)

## 2024-10-04 MED ORDER — PANTOPRAZOLE SODIUM 40 MG PO TBEC: 40.0000 mg | DELAYED_RELEASE_TABLET | Freq: Every day | ORAL | 2 refills | Status: DC

## 2024-10-04 MED ORDER — ONDANSETRON 4 MG PO TBDP: 4.0000 mg | ORAL_TABLET | Freq: Three times a day (TID) | ORAL | 0 refills | Status: AC | PRN

## 2024-10-04 NOTE — ED Triage Notes (Signed)
 Pt to ED via POV from home. Pt reports vomiting some blood yesterday. Pt reports this happened a few weeks ago. Pt showed picture and appears brown tinged. Denies etoh.

## 2024-10-04 NOTE — Discharge Instructions (Signed)
 Your symptoms are are likely from gastritis (inflammation in stomach lining) or an ulcer.  Start taking the Protonix  daily whether you are having symptoms or not.  This will decrease stomach acid and should help with both the pain and vomiting.  You can take the Zofran  as needed for nausea.  Follow-up with gastroenterology.  Return to the ER immediately for new, worsening, or persistent severe abdominal pain, vomiting, any vomiting of bright red blood, coffee-ground type material, black or tarry stools, blood in the stool, dizziness or lightheadedness, persistent vomiting, persistent pain, or any other new or worsening symptoms that concern you.

## 2024-10-04 NOTE — ED Provider Notes (Signed)
 [facility] Provider Note    Event Date/Time   First MD Initiated Contact with Patient 10/04/24 1005     (approximate)   History   Emesis   HPI  Lisa House is a 33 y.o. female with history of GERD, obesity, eczema, OCD, and depression who presents with concern for possibly vomiting blood.  The patient states that she has had intermittent stomach burning especially after she eats that has been present for several months.  It is in her upper abdomen.  She is not having this currently.  She has also had intermittent vomiting and dry heaving especially in the evenings and after eating.  Last night she had an episode like this and vomited some brown material which she was concerned could be blood.  She had vomited a couple of times before she saw this material.  She showed a picture of it on her phone.  She had another episode a few weeks ago with vomitus of a similar color, but that occurred after she had been drinking Dr Nunzio.  She has not had any bright red blood or any coffee-ground type material.  She has not had any dark or tarry stools or any blood in the stool.  She denies dizziness or lightheadedness.  She has no chest pain or difficulty breathing.  I reviewed the past medical records.  The patient's most recent outpatient encounter was on 11/18 with family medicine for an initial visit to establish care.  She reported fatigue at that time.   Physical Exam   Triage Vital Signs: ED Triage Vitals  Encounter Vitals Group     BP 10/04/24 0923 130/80     Girls Systolic BP Percentile --      Girls Diastolic BP Percentile --      Boys Systolic BP Percentile --      Boys Diastolic BP Percentile --      Pulse Rate 10/04/24 0923 64     Resp 10/04/24 0930 16     Temp 10/04/24 0923 98.4 F (36.9 C)     Temp Source 10/04/24 0923 Oral     SpO2 10/04/24 0923 100 %     Weight 10/04/24 0923 207 lb (93.9 kg)     Height 10/04/24 0923 5' 5 (1.651 m)     Head  Circumference --      Peak Flow --      Pain Score 10/04/24 0928 0     Pain Loc --      Pain Education --      Exclude from Growth Chart --     Most recent vital signs: Vitals:   10/04/24 0923 10/04/24 0930  BP: 130/80   Pulse: 64   Resp:  16  Temp: 98.4 F (36.9 C)   SpO2: 100%      General: Awake, no distress.  CV:  Good peripheral perfusion.  Resp:  Normal effort.  Abd:  Soft and nontender.  No distention.  Other:  No jaundice or scleral icterus.   ED Results / Procedures / Treatments   Labs (all labs ordered are listed, but only abnormal results are displayed) Labs Reviewed  COMPREHENSIVE METABOLIC PANEL WITH GFR - Abnormal; Notable for the following components:      Result Value   Glucose, Bld 107 (*)    BUN 5 (*)    All other components within normal limits  URINALYSIS, ROUTINE W REFLEX MICROSCOPIC - Abnormal; Notable for the following components:  Color, Urine STRAW (*)    APPearance CLEAR (*)    Specific Gravity, Urine 1.003 (*)    Hgb urine dipstick SMALL (*)    Bacteria, UA RARE (*)    All other components within normal limits  LIPASE, BLOOD  CBC  POC URINE PREG, ED     EKG    RADIOLOGY    PROCEDURES:  Critical Care performed: No  Procedures   MEDICATIONS ORDERED IN ED: Medications - No data to display   IMPRESSION / MDM / ASSESSMENT AND PLAN / ED COURSE  I reviewed the triage vital signs and the nursing notes.  33 year old female with PMH as noted above presents with a couple of episodes of vomiting brown material which she is concerned could be blood, once last night and once a few weeks ago.  She has also had intermittent epigastric burning type pain after eating, and has woken up several times with burning up into her throat and a sour acid taste in the back of her throat and up to her nose.  Differential diagnosis includes, but is not limited to, GERD, gastritis, PUD.  The patient does not have a history of heavy alcohol use.   She showed me pictures of the vomitus on her phone.  It appeared the medium to dark brown with no solid coffee-ground type material, and no bright red blood.  I performed a stool guaiac study and rectal exam.  The stool is brown in color and was guaiac negative.  Overall I have a low suspicion for any active upper GI bleed.  We will obtain labs and reassess.   Patient's presentation is most consistent with acute complicated illness / injury requiring diagnostic workup.  ----------------------------------------- 11:11 AM on 10/04/2024 -----------------------------------------  CBC is normal.  Given that the patient was guaiac negative, is not continuing to have any vomiting, and had no obvious blood in the vomitus based on the pictures, there is no evidence of an active GI bleed; she is clinically stable.  There is no indication for serial hemoglobins.  CMP shows no acute findings.  Lipase is normal.  Urinalysis is unremarkable.  At this time the patient is stable for discharge.  She feels comfortable going home.  I counseled her on the results of the workup.  I recommend starting her on Protonix  for likely gastritis or PUD.  I have given her referral information for GI follow-up.  I gave strict return precautions, and she expressed understanding.  FINAL CLINICAL IMPRESSION(S) / ED DIAGNOSES   Final diagnoses:  Epigastric pain  Nausea and vomiting, unspecified vomiting type     Rx / DC Orders   ED Discharge Orders          Ordered    pantoprazole  (PROTONIX ) 40 MG tablet  Daily        10/04/24 1109    ondansetron  (ZOFRAN -ODT) 4 MG disintegrating tablet  Every 8 hours PRN        10/04/24 1109             Note:  This document was prepared using Dragon voice recognition software and may include unintentional dictation errors.    Jacolyn Pae, MD 10/04/24 1113

## 2024-10-05 DIAGNOSIS — F4310 Post-traumatic stress disorder, unspecified: Secondary | ICD-10-CM | POA: Diagnosis not present

## 2024-10-05 DIAGNOSIS — F339 Major depressive disorder, recurrent, unspecified: Secondary | ICD-10-CM | POA: Diagnosis not present

## 2024-10-05 DIAGNOSIS — F411 Generalized anxiety disorder: Secondary | ICD-10-CM | POA: Diagnosis not present

## 2024-10-08 DIAGNOSIS — F339 Major depressive disorder, recurrent, unspecified: Secondary | ICD-10-CM | POA: Diagnosis not present

## 2024-10-08 DIAGNOSIS — F4310 Post-traumatic stress disorder, unspecified: Secondary | ICD-10-CM | POA: Diagnosis not present

## 2024-10-08 DIAGNOSIS — F411 Generalized anxiety disorder: Secondary | ICD-10-CM | POA: Diagnosis not present

## 2024-10-27 ENCOUNTER — Other Ambulatory Visit: Payer: Self-pay

## 2024-10-27 ENCOUNTER — Emergency Department
Admission: EM | Admit: 2024-10-27 | Discharge: 2024-10-27 | Disposition: A | Discharge status: Home / Self Care | Attending: Emergency Medicine | Admitting: Emergency Medicine

## 2024-10-27 DIAGNOSIS — R09A2 Foreign body sensation, throat: Secondary | ICD-10-CM | POA: Insufficient documentation

## 2024-10-27 DIAGNOSIS — R059 Cough, unspecified: Secondary | ICD-10-CM | POA: Insufficient documentation

## 2024-10-27 DIAGNOSIS — K219 Gastro-esophageal reflux disease without esophagitis: Secondary | ICD-10-CM | POA: Diagnosis not present

## 2024-10-27 DIAGNOSIS — J029 Acute pharyngitis, unspecified: Secondary | ICD-10-CM | POA: Insufficient documentation

## 2024-10-27 LAB — RESP PANEL BY RT-PCR (RSV, FLU A&B, COVID)  RVPGX2
Influenza A by PCR: NEGATIVE
Influenza B by PCR: NEGATIVE
Resp Syncytial Virus by PCR: NEGATIVE
SARS Coronavirus 2 by RT PCR: NEGATIVE

## 2024-10-27 LAB — GROUP A STREP BY PCR: Group A Strep by PCR: NOT DETECTED

## 2024-10-27 MED ORDER — PANTOPRAZOLE SODIUM 40 MG PO TBEC: 40.0000 mg | DELAYED_RELEASE_TABLET | Freq: Every day | ORAL | 0 refills | Status: AC

## 2024-10-27 MED ADMIN — Pantoprazole Sodium EC Tab 40 MG (Base Equiv): 40 mg | ORAL | @ 13:00:00 | NDC 35573042851

## 2024-10-27 MED ADMIN — Acetaminophen Tab 325 MG: 650 mg | ORAL | @ 13:00:00 | NDC 50580045811

## 2024-10-27 MED FILL — Pantoprazole Sodium EC Tab 40 MG (Base Equiv): 40.0000 mg | ORAL | Qty: 1 | Status: AC

## 2024-10-27 MED FILL — Acetaminophen Tab 325 MG: 650.0000 mg | ORAL | Qty: 2 | Status: AC

## 2024-10-27 NOTE — ED Triage Notes (Signed)
 Pt comes with sore throat for couple days. Pt states she also feels like something is stuck in her throat. Pt states painful to swallow and drink

## 2024-10-27 NOTE — Discharge Instructions (Addendum)
 We believe your symptoms are a result of GERD (acid reflux).  Please read through the included information and follow up with your regular doctor.  In the meantime, we encourage you to try an over-the-counter medication such as Prilosec OTC. I have sent in a prescription for you. Give it at least a week at see if your symptoms improve.  Return to the Emergency Department with new or worsening symptoms that concern you.   Heartburn Heartburn is a type of pain or discomfort that can happen in the throat or chest. It is often described as a burning pain. It may also cause a bad taste in the mouth. Heartburn may feel worse when you lie down or bend over, and it is often worse at night. Heartburn may be caused by stomach contents that move back up into the esophagus (reflux). HOME CARE INSTRUCTIONS Take these actions to decrease your discomfort and to help avoid complications. Diet Follow a diet as recommended by your health care provider. This may involve avoiding foods and drinks such as: Coffee and tea (with or without caffeine ). Drinks that contain alcohol. Energy drinks and sports drinks. Carbonated drinks or sodas. Chocolate and cocoa. Peppermint and mint flavorings. Garlic and onions. Horseradish. Spicy and acidic foods, including peppers, chili powder, curry powder, vinegar, hot sauces, and barbecue sauce. Citrus fruit juices and citrus fruits, such as oranges, lemons, and limes. Tomato-based foods, such as red sauce, chili, salsa, and pizza with red sauce. Fried and fatty foods, such as donuts, french fries, potato chips, and high-fat dressings. High-fat meats, such as hot dogs and fatty cuts of red and white meats, such as rib eye steak, sausage, ham, and bacon. High-fat dairy items, such as whole milk, butter, and cream cheese. Eat small, frequent meals instead of large meals. Avoid drinking large amounts of liquid with your meals. Avoid eating meals during the 2-3 hours before  bedtime. Avoid lying down right after you eat. Do not exercise right after you eat.  General Instructions  Pay attention to any changes in your symptoms. Take over-the-counter and prescription medicines only as told by your health care provider. Do not take aspirin , ibuprofen , or other NSAIDs unless your health care provider told you to do so. Do not use any tobacco products, including cigarettes, chewing tobacco, and e-cigarettes. If you need help quitting, ask your health care provider. Wear loose-fitting clothing. Do not wear anything tight around your waist that causes pressure on your abdomen. Raise (elevate) the head of your bed about 6 inches (15 cm). Try to reduce your stress, such as with yoga or meditation. If you need help reducing stress, ask your health care provider. If you are overweight, reduce your weight to an amount that is healthy for you. Ask your health care provider for guidance about a safe weight loss goal. Keep all follow-up visits as told by your health care provider. This is important. SEEK MEDICAL CARE IF: You have new symptoms. You have unexplained weight loss. You have difficulty swallowing, or it hurts to swallow. You have wheezing or a persistent cough. Your symptoms do not improve with treatment. You have frequent heartburn for more than two weeks. SEEK IMMEDIATE MEDICAL CARE IF: You have pain in your arms, neck, jaw, teeth, or back. You feel sweaty, dizzy, or light-headed. You have chest pain or shortness of breath. You vomit and your vomit looks like blood or coffee grounds. Your stool is bloody or black.   This information is not intended to replace  advice given to you by your health care provider. Make sure you discuss any questions you have with your health care provider.   Document Released: 03/15/2009 Document Revised: 07/18/2015 Document Reviewed: 02/21/2015 Elsevier Interactive Patient Education 2016 [employer].  Food Choices for  Gastroesophageal Reflux Disease, Adult When you have gastroesophageal reflux disease (GERD), the foods you eat and your eating habits are very important. Choosing the right foods can help ease the discomfort of GERD. WHAT GENERAL GUIDELINES DO I NEED TO FOLLOW? Choose fruits, vegetables, whole grains, low-fat dairy products, and low-fat meat, fish, and poultry. Limit fats such as oils, salad dressings, butter, nuts, and avocado. Keep a food diary to identify foods that cause symptoms. Avoid foods that cause reflux. These may be different for different people. Eat frequent small meals instead of three large meals each day. Eat your meals slowly, in a relaxed setting. Limit fried foods. Cook foods using methods other than frying. Avoid drinking alcohol. Avoid drinking large amounts of liquids with your meals. Avoid bending over or lying down until 2-3 hours after eating. WHAT FOODS ARE NOT RECOMMENDED? The following are some foods and drinks that may worsen your symptoms: Vegetables Tomatoes. Tomato juice. Tomato and spaghetti sauce. Chili peppers. Onion and garlic. Horseradish. Fruits Oranges, grapefruit, and lemon (fruit and juice). Meats High-fat meats, fish, and poultry. This includes hot dogs, ribs, ham, sausage, salami, and bacon. Dairy Whole milk and chocolate milk. Sour cream. Cream. Butter. Ice cream. Cream cheese.  Beverages Coffee and tea, with or without caffeine . Carbonated beverages or energy drinks. Condiments Hot sauce. Barbecue sauce.  Sweets/Desserts Chocolate and cocoa. Donuts. Peppermint and spearmint. Fats and Oils High-fat foods, including French fries and potato chips. Other Vinegar. Strong spices, such as black pepper, white pepper, red pepper, cayenne, curry powder, cloves, ginger, and chili powder. The items listed above may not be a complete list of foods and beverages to avoid. Contact your dietitian for more information.   This information is not  intended to replace advice given to you by your health care provider. Make sure you discuss any questions you have with your health care provider.   Document Released: 10/27/2005 Document Revised: 11/17/2014 Document Reviewed: 08/31/2013 Elsevier Interactive Patient Education [employer]

## 2024-10-27 NOTE — ED Provider Notes (Signed)
 [facility] Provider Note    Event Date/Time   First MD Initiated Contact with Patient 10/27/24 1029     (approximate)   History   Sore Throat   HPI  Lisa House is a 33 y.o. female  with a past medical history of IBS, OCD, PCOS, Psoriasis, hx of depression, GERD, lumbar radiculopathy, post-nasal drip presents to the emergency department with sore throat, feeling like something is stuck in her esophagus, described as burning x 3 days.  She reports she was recently sick with a cough last week along with some of her family members.  She denies cough, hematemesis, otalgia, fever, chest pain, dyspnea, abdominal pain, vomiting, diarrhea, drooling, jaw or neck pain.  She does not recall any specific food or beverage items that caused the pain to start. She reports she was prescribed Pepcid  and Protonix  at a prior ER visit in November but never picked these medications up.  No history of heavy alcohol use. Hx cholecystectomy.  Physical Exam   Triage Vital Signs: ED Triage Vitals  Encounter Vitals Group     BP 10/27/24 0928 (!) 117/57     Girls Systolic BP Percentile --      Girls Diastolic BP Percentile --      Boys Systolic BP Percentile --      Boys Diastolic BP Percentile --      Pulse Rate 10/27/24 0928 68     Resp 10/27/24 0928 17     Temp 10/27/24 0928 98.2 F (36.8 C)     Temp Source 10/27/24 0928 Oral     SpO2 10/27/24 0928 100 %     Weight 10/27/24 0927 207 lb (93.9 kg)     Height 10/27/24 0927 5' 5 (1.651 m)     Head Circumference --      Peak Flow --      Pain Score 10/27/24 0927 2     Pain Loc --      Pain Education --      Exclude from Growth Chart --     Most recent vital signs: Vitals:   10/27/24 0928 10/27/24 1033  BP: (!) 117/57   Pulse: 68   Resp: 17   Temp: 98.2 F (36.8 C)   SpO2: 100% 100%    General: Awake, in no acute distress. Afebrile. Head: Normocephalic, atraumatic. Eyes: PERRLA. EOMs intact. No scleral  icterus or conjunctival injection. Ears/Nose/Throat: TMs intact b/l. Nares patent, no nasal discharge. Oropharynx moist, no erythema or exudate. Dentition intact. No trismus, drooling.  No uvular deviation. Neck: Supple, no lymphadenopathy, no nuchal rigidity. CV: Good peripheral perfusion. RRR, 68 bpm. Respiratory:Normal respiratory effort.  No respiratory distress. CTAB.  GI: Soft, non-distended, non-tender. No rebound or guarding. Skin:Warm, dry, intact.  Neurological: A&Ox4 to person, place, time, and situation.  ED Results / Procedures / Treatments   Labs (all labs ordered are listed, but only abnormal results are displayed) Labs Reviewed  GROUP A STREP BY PCR  RESP PANEL BY RT-PCR (RSV, FLU A&B, COVID)  [id number]     EKG  NSR Rate: 62 bpm Rhythm: regular Axis: Positive P Waves: present before every QRS PR Interval: 182 ms QRS Complex: 86 ms ST Segment: isometric QT interval:  448 ms T Waves: upright, symmetric No evidence of STEMI, NSTEMI, LBBB.  RADIOLOGY    PROCEDURES:  Critical Care performed: No   Procedures   MEDICATIONS ORDERED IN ED: Medications  pantoprazole  (PROTONIX ) EC tablet 40 mg (40 mg  Oral Given 10/27/24 1246)  acetaminophen  (TYLENOL ) tablet 650 mg (650 mg Oral Given 10/27/24 1246)     IMPRESSION / MDM / ASSESSMENT AND PLAN / ED COURSE  I reviewed the triage vital signs and the nursing notes.                              Differential diagnosis includes, but is not limited to GERD, PUD, strep pharyngitis, COVID, Influenza, RSV  Patient's presentation is most consistent with acute complicated illness / injury requiring diagnostic workup.  Patient is a 33 year old female presenting with sore throat and a burning sensation from her throat to her chest x 3 days.  She was recently sick.  She is afebrile with generally normal vital signs other than a BP of 117/57.  Strep PCR was negative.  Respiratory panel was negative.  She is not having any  chest or abdominal pain.  Did order an EKG on her which showed normal sinus rhythm.  No evidence of acute ischemic changes.  She had no trismus or drooling on exam.  No uvular deviation.  Gave her a dose of pantoprazole  and Tylenol  in the emergency department, and patient states she is feeling better at this time.  I will have her follow-up with her primary care provider following today's visit.  I did give her a new prescription for Protonix  given she did not pick the last one up at the pharmacy after given Rx on 10/04/2024.  They can refer her to GI for further management if needed.  The patient may return to the emergency department for any new, worsening, or concerning symptoms. Patient was given the opportunity to ask questions; all questions were answered. Emergency department return precautions were discussed with the patient.  Patient is in agreement to the treatment plan.  Patient is stable for discharge.   FINAL CLINICAL IMPRESSION(S) / ED DIAGNOSES   Final diagnoses:  Foreign body sensation in throat  Gastroesophageal reflux disease, unspecified whether esophagitis present     Rx / DC Orders   ED Discharge Orders          Ordered    pantoprazole  (PROTONIX ) 40 MG tablet  Daily        10/27/24 1303             Note:  This document was prepared using Dragon voice recognition software and may include unintentional dictation errors.     Sheron Salm, PA-C 10/27/24 1508    Jacolyn Pae, MD 10/27/24 1523    Jacolyn Pae, MD 10/27/24 [phone]

## 2024-11-04 ENCOUNTER — Ambulatory Visit: Payer: Self-pay

## 2024-11-04 NOTE — Telephone Encounter (Signed)
 FYI Only or Action Required?: FYI only for provider: appointment scheduled on 11/04/2024.  Patient was last seen in primary care on 09/27/2024 by Franchot Isaiah LABOR, MD.  Called Nurse Triage reporting Sore Throat.  Symptoms began a week ago.  Interventions attempted: Prescription medications: protonix .  Symptoms are: gradually improving.  Triage Disposition: See PCP Within 2 Weeks  Patient/caregiver understands and will follow disposition?: Yes  Copied from CRM #[id number]. Topic: Clinical - Red Word Triage >> Nov 04, 2024 12:10 PM Ivette P wrote: [facility] that prompted transfer to Nurse Triage: feeling like something stuck in throat. getting on nerves. seen at ER. gave soemthing for stomach.   sometimes feel like drinking gets stuck. hard to explain. Reason for Disposition  Nursing judgment or information in reference  Answer Assessment - Initial Assessment Questions 1. ONSET: When did the throat start hurting? (Hours or days ago)      Feels like something stuck in throat that started a little over a week ago. Patient says she is concerned about throat cancer or wants and xray to see if something is stuck in throat. ER gave her protonix  and that helped but feeling is still there.  2. SEVERITY: How bad is the sore throat? (Scale 1-10; mild, moderate or severe)     Annoying 4.  VIRAL SYMPTOMS: Are there any symptoms of a cold, such as a runny nose, cough, hoarse voice or red eyes?      Denies 5. FEVER: Do you have a fever? If Yes, ask: What is your temperature, how was it measured, and when did it start?   Denies  Answer Assessment - Initial Assessment Questions 1. REASON FOR CALL: What is your main concern right now?  1. ONSET: When did the throat start hurting? (Hours or days ago)      Feels like something stuck in throat that started a little over a week ago. Patient says she is concerned about throat cancer or wants and xray to see if something is stuck in throat.   Seen at ER on 10/27/2024, gave her protonix  and that helped but feeling is still there. Says she has had acid reflux before but it felt different than this. 2. SEVERITY: How bad is the sore throat? (Scale 1-10; mild, moderate or severe)     Annoying 4.  VIRAL SYMPTOMS: Are there any symptoms of a cold, such as a runny nose, cough, hoarse voice or red eyes?      Denies 5. FEVER: Do you have a fever? If Yes, ask: What is your temperature, how was it measured, and when did it start?   Denies  Protocols used: Sore Throat-A-AH, No Guideline Available-A-AH

## 2024-11-07 ENCOUNTER — Ambulatory Visit: Admitting: General Practice

## 2024-11-07 DIAGNOSIS — Z91199 Patient's noncompliance with other medical treatment and regimen due to unspecified reason: Secondary | ICD-10-CM

## 2024-11-07 NOTE — Progress Notes (Signed)
 Patient no-showed today's appointment; appointment was for an acute visit.
